# Patient Record
Sex: Female | Born: 1995 | Race: Black or African American | Hispanic: No | Marital: Single | Smoking: Former smoker
Health system: Southern US, Community
[De-identification: ages and names within clinical notes are randomized; demographics above are authoritative.]

## PROBLEM LIST (undated history)

## (undated) ENCOUNTER — Inpatient Hospital Stay (HOSPITAL_COMMUNITY): Payer: Self-pay

## (undated) DIAGNOSIS — B999 Unspecified infectious disease: Secondary | ICD-10-CM

## (undated) DIAGNOSIS — F419 Anxiety disorder, unspecified: Secondary | ICD-10-CM

## (undated) DIAGNOSIS — D649 Anemia, unspecified: Secondary | ICD-10-CM

## (undated) DIAGNOSIS — A549 Gonococcal infection, unspecified: Secondary | ICD-10-CM

## (undated) DIAGNOSIS — A749 Chlamydial infection, unspecified: Secondary | ICD-10-CM

## (undated) DIAGNOSIS — B009 Herpesviral infection, unspecified: Secondary | ICD-10-CM

## (undated) HISTORY — DX: Anxiety disorder, unspecified: F41.9

## (undated) HISTORY — PX: NO PAST SURGERIES: SHX2092

## (undated) HISTORY — DX: Gonococcal infection, unspecified: A54.9

## (undated) HISTORY — DX: Anemia, unspecified: D64.9

## (undated) HISTORY — DX: Herpesviral infection, unspecified: B00.9

## (undated) HISTORY — DX: Chlamydial infection, unspecified: A74.9

---
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
## 2014-03-18 ENCOUNTER — Ambulatory Visit (INDEPENDENT_AMBULATORY_CARE_PROVIDER_SITE_OTHER): Payer: BC Managed Care – PPO | Admitting: Women's Health

## 2014-03-18 ENCOUNTER — Other Ambulatory Visit: Payer: Self-pay | Admitting: Women's Health

## 2014-03-18 ENCOUNTER — Encounter: Payer: Self-pay | Admitting: Women's Health

## 2014-03-18 DIAGNOSIS — N898 Other specified noninflammatory disorders of vagina: Secondary | ICD-10-CM

## 2014-03-18 DIAGNOSIS — R350 Frequency of micturition: Secondary | ICD-10-CM

## 2014-03-18 DIAGNOSIS — B3731 Acute candidiasis of vulva and vagina: Secondary | ICD-10-CM

## 2014-03-18 DIAGNOSIS — N760 Acute vaginitis: Secondary | ICD-10-CM

## 2014-03-18 DIAGNOSIS — B9689 Other specified bacterial agents as the cause of diseases classified elsewhere: Secondary | ICD-10-CM

## 2014-03-18 DIAGNOSIS — Z3049 Encounter for surveillance of other contraceptives: Secondary | ICD-10-CM

## 2014-03-18 DIAGNOSIS — N9489 Other specified conditions associated with female genital organs and menstrual cycle: Secondary | ICD-10-CM

## 2014-03-18 DIAGNOSIS — B373 Candidiasis of vulva and vagina: Secondary | ICD-10-CM

## 2014-03-18 DIAGNOSIS — A499 Bacterial infection, unspecified: Secondary | ICD-10-CM

## 2014-03-18 LAB — URINALYSIS W MICROSCOPIC + REFLEX CULTURE
Bilirubin Urine: NEGATIVE
CASTS: NONE SEEN
CRYSTALS: NONE SEEN
Glucose, UA: NEGATIVE mg/dL
Ketones, ur: NEGATIVE mg/dL
Nitrite: NEGATIVE
Protein, ur: NEGATIVE mg/dL
SPECIFIC GRAVITY, URINE: 1.02 (ref 1.005–1.030)
Urobilinogen, UA: 1 mg/dL (ref 0.0–1.0)
pH: 7 (ref 5.0–8.0)

## 2014-03-18 LAB — WET PREP FOR TRICH, YEAST, CLUE: Trich, Wet Prep: NONE SEEN

## 2014-03-18 LAB — PREGNANCY, URINE: PREG TEST UR: NEGATIVE

## 2014-03-18 MED ORDER — FLUCONAZOLE 150 MG PO TABS: 150.0000 mg | ORAL_TABLET | Freq: Once | ORAL | Status: DC

## 2014-03-18 MED ORDER — METRONIDAZOLE 500 MG PO TABS: 500.0000 mg | ORAL_TABLET | Freq: Two times a day (BID) | ORAL | Status: DC

## 2014-03-18 MED ORDER — MEDROXYPROGESTERONE ACETATE 150 MG/ML IM SUSP
150.0000 mg | Freq: Once | INTRAMUSCULAR | Status: AC
  Administered 2014-03-18: 150 mg via INTRAMUSCULAR

## 2014-03-18 NOTE — Patient Instructions (Signed)
Monilial Vaginitis Vaginitis in a soreness, swelling and redness (inflammation) of the vagina and vulva. Monilial vaginitis is not a sexually transmitted infection. CAUSES  Yeast vaginitis is caused by yeast (candida) that is normally found in your vagina. With a yeast infection, the candida has overgrown in number to a point that upsets the chemical balance. SYMPTOMS   White, thick vaginal discharge.  Swelling, itching, redness and irritation of the vagina and possibly the lips of the vagina (vulva).  Burning or painful urination.  Painful intercourse. DIAGNOSIS  Things that may contribute to monilial vaginitis are:  Postmenopausal and virginal states.  Pregnancy.  Infections.  Being tired, sick or stressed, especially if you had monilial vaginitis in the past.  Diabetes. Good control will help lower the chance.  Birth control pills.  Tight fitting garments.  Using bubble bath, feminine sprays, douches or deodorant tampons.  Taking certain medications that kill germs (antibiotics).  Sporadic recurrence can occur if you become ill. TREATMENT  Your caregiver will give you medication.  There are several kinds of anti monilial vaginal creams and suppositories specific for monilial vaginitis. For recurrent yeast infections, use a suppository or cream in the vagina 2 times a week, or as directed.  Anti-monilial or steroid cream for the itching or irritation of the vulva may also be used. Get your caregiver's permission.  Painting the vagina with methylene blue solution may help if the monilial cream does not work.  Eating yogurt may help prevent monilial vaginitis. HOME CARE INSTRUCTIONS   Finish all medication as prescribed.  Do not have sex until treatment is completed or after your caregiver tells you it is okay.  Take warm sitz baths.  Do not douche.  Do not use tampons, especially scented ones.  Wear cotton underwear.  Avoid tight pants and panty  hose.  Tell your sexual partner that you have a yeast infection. They should go to their caregiver if they have symptoms such as mild rash or itching.  Your sexual partner should be treated as well if your infection is difficult to eliminate.  Practice safer sex. Use condoms.  Some vaginal medications cause latex condoms to fail. Vaginal medications that harm condoms are:  Cleocin cream.  Butoconazole (Femstat).  Terconazole (Terazol) vaginal suppository.  Miconazole (Monistat) (may be purchased over the counter). SEEK MEDICAL CARE IF:   You have a temperature by mouth above 102 F (38.9 C).  The infection is getting worse after 2 days of treatment.  The infection is not getting better after 3 days of treatment.  You develop blisters in or around your vagina.  You develop vaginal bleeding, and it is not your menstrual period.  You have pain when you urinate.  You develop intestinal problems.  You have pain with sexual intercourse. Document Released: 04/17/2005 Document Revised: 09/30/2011 Document Reviewed: 12/30/2008 ExitCare Patient Information 2015 [employer]. This information is not intended to replace advice given to you by your health care provider. Make sure you discuss any questions you have with your health care provider. Bacterial Vaginosis Bacterial vaginosis is an infection of the vagina. It happens when too many of certain germs (bacteria) grow in the vagina. HOME CARE  Take your medicine as told by your doctor.  Finish your medicine even if you start to feel better.  Do not have sex until you finish your medicine and are better.  Tell your sex partner that you have an infection. They should see their doctor for treatment.  Practice safe sex.   Use condoms. Have only one sex partner. GET HELP IF:  You are not getting better after 3 days of treatment.  You have more grey fluid (discharge) coming from your vagina than before.  You have more  pain than before.  You have a fever. MAKE SURE YOU:   Understand these instructions.  Will watch your condition.  Will get help right away if you are not doing well or get worse. Document Released: 04/16/2008 Document Revised: 04/28/2013 Document Reviewed: 02/17/2013 ExitCare Patient Information 2015 [employer]. This information is not intended to replace advice given to you by your health care provider. Make sure you discuss any questions you have with your health care provider.  

## 2014-03-18 NOTE — Progress Notes (Addendum)
Patient ID: Veronica Mclean, female   DOB: [birth year], 18 y.o.   MRN: [id number]  Presents with complaint of vaginal bleeding for about one month, 2 weeks late for Depo-Provera. Has not been sexually active for greater than month. Discharge with odor and irritation. Increased urinary frequency with urgency, denies pain with urination. Denies fever. Negative cultures with past partner. Uses condoms consistently.  Exam: Appears well. External genitalia within normal limits, speculum exam moderate amount of a bloody discharge with  odor, wet prep positive for yeast, amines, clues, and TNTC bacteria. Bimanual no CMT or adnexal fullness or tenderness. U PT negative. UA: Trace leukocytes, 7-10 WBCs, many bacteria,many squamous epithelium.  Bacteria vaginosis with yeast Overdue for Depo-Provera  Plan: Depo-Provera 150 IM every 12 weeks, reviewed importance of returning to office every 12 weeks, continue condoms. Flagyl 500 twice daily for 7 days, alcohol precautions reviewed. Diflucan 150 by mouth x1 dose prescription, proper use given and reviewed. Instructed to call or return if no relief of symptoms. Urine culture pending, will await culture results prior to treating.

## 2014-03-18 NOTE — Addendum Note (Signed)
Addended by: Berna Spare A on: 03/18/2014 04:32 PM   Modules accepted: Orders

## 2014-03-19 LAB — URINE CULTURE: Colony Count: 40000

## 2014-06-06 ENCOUNTER — Ambulatory Visit (INDEPENDENT_AMBULATORY_CARE_PROVIDER_SITE_OTHER): Payer: BC Managed Care – PPO | Admitting: *Deleted

## 2014-06-06 DIAGNOSIS — Z3042 Encounter for surveillance of injectable contraceptive: Secondary | ICD-10-CM

## 2014-06-06 MED ORDER — MEDROXYPROGESTERONE ACETATE 150 MG/ML IM SUSP
150.0000 mg | Freq: Once | INTRAMUSCULAR | Status: AC
  Administered 2014-06-06: 150 mg via INTRAMUSCULAR

## 2014-12-23 ENCOUNTER — Ambulatory Visit (INDEPENDENT_AMBULATORY_CARE_PROVIDER_SITE_OTHER): Payer: BLUE CROSS/BLUE SHIELD | Admitting: Women's Health

## 2014-12-23 ENCOUNTER — Encounter: Payer: Self-pay | Admitting: Women's Health

## 2014-12-23 VITALS — BP 110/76 | Ht 67.0 in | Wt 141.4 lb

## 2014-12-23 DIAGNOSIS — Z01419 Encounter for gynecological examination (general) (routine) without abnormal findings: Secondary | ICD-10-CM | POA: Diagnosis not present

## 2014-12-23 DIAGNOSIS — N938 Other specified abnormal uterine and vaginal bleeding: Secondary | ICD-10-CM

## 2014-12-23 DIAGNOSIS — Z30018 Encounter for initial prescription of other contraceptives: Secondary | ICD-10-CM

## 2014-12-23 LAB — COMPREHENSIVE METABOLIC PANEL
ALK PHOS: 49 U/L (ref 39–117)
ALT: 17 U/L (ref 0–35)
AST: 20 U/L (ref 0–37)
Albumin: 4.3 g/dL (ref 3.5–5.2)
BUN: 12 mg/dL (ref 6–23)
CO2: 26 mEq/L (ref 19–32)
Calcium: 9.9 mg/dL (ref 8.4–10.5)
Chloride: 106 mEq/L (ref 96–112)
Creat: 0.78 mg/dL (ref 0.50–1.10)
Glucose, Bld: 83 mg/dL (ref 70–99)
Potassium: 5.1 mEq/L (ref 3.5–5.3)
SODIUM: 141 meq/L (ref 135–145)
Total Bilirubin: 0.9 mg/dL (ref 0.2–1.2)
Total Protein: 7.1 g/dL (ref 6.0–8.3)

## 2014-12-23 LAB — CBC WITH DIFFERENTIAL/PLATELET
BASOS ABS: 0 10*3/uL (ref 0.0–0.1)
Basophils Relative: 0 % (ref 0–1)
Eosinophils Absolute: 0.1 10*3/uL (ref 0.0–0.7)
Eosinophils Relative: 4 % (ref 0–5)
HCT: 40.7 % (ref 36.0–46.0)
Hemoglobin: 13.9 g/dL (ref 12.0–15.0)
LYMPHS PCT: 45 % (ref 12–46)
Lymphs Abs: 1.4 10*3/uL (ref 0.7–4.0)
MCH: 29 pg (ref 26.0–34.0)
MCHC: 34.2 g/dL (ref 30.0–36.0)
MCV: 84.8 fL (ref 78.0–100.0)
MONO ABS: 0.3 10*3/uL (ref 0.1–1.0)
MPV: 10.9 fL (ref 8.6–12.4)
Monocytes Relative: 9 % (ref 3–12)
Neutro Abs: 1.3 10*3/uL — ABNORMAL LOW (ref 1.7–7.7)
Neutrophils Relative %: 42 % — ABNORMAL LOW (ref 43–77)
PLATELETS: 181 10*3/uL (ref 150–400)
RBC: 4.8 MIL/uL (ref 3.87–5.11)
RDW: 12.6 % (ref 11.5–15.5)
WBC: 3 10*3/uL — AB (ref 4.0–10.5)

## 2014-12-23 LAB — TSH: TSH: 1.976 u[IU]/mL (ref 0.350–4.500)

## 2014-12-23 MED ORDER — MEDROXYPROGESTERONE ACETATE 150 MG/ML IM SUSP
INTRAMUSCULAR | Status: DC
Start: 2014-12-23 — End: 2015-03-28

## 2014-12-23 MED ORDER — MEDROXYPROGESTERONE ACETATE 10 MG PO TABS: 10.0000 mg | ORAL_TABLET | Freq: Every day | ORAL | Status: DC

## 2014-12-23 NOTE — Progress Notes (Signed)
Veronica MainlandZynovia J Mclean 07/24/1995 [id number]    History:    Presents for annual exam.  Amenorrheic on Depo-Provera ,did not receive last dose due in February and has had irregular bleeding since. Same partner with negative STD screen/condoms. Completed gardasil.  Past medical history, past surgical history, family history and social history were all reviewed and documented in the EPIC chart. [employer] [facility], pre- nursing. Mother healthy.  ROS:  A ROS was performed and pertinent positives and negatives are included.  Exam:  Filed Vitals:   12/23/14 1219  BP: 110/76    General appearance:  Normal Thyroid:  Symmetrical, normal in size, without palpable masses or nodularity. Respiratory  Auscultation:  Clear without wheezing or rhonchi Cardiovascular  Auscultation:  Regular rate, without rubs, murmurs or gallops  Edema/varicosities:  Not grossly evident Abdominal  Soft,nontender, without masses, guarding or rebound.  Liver/spleen:  No organomegaly noted  Hernia:  None appreciated  Skin  Inspection:  Grossly normal   Breasts: Examined lying and sitting.     Right: Without masses, retractions, discharge or axillary adenopathy.     Left: Without masses, retractions, discharge or axillary adenopathy. Gentitourinary   Inguinal/mons:  Normal without inguinal adenopathy  External genitalia:  Normal  BUS/Urethra/Skene's glands:  Normal  Vagina:  Normal no blood or discharge noted  Cervix:  Normal  Uterus:   normal in size, shape and contour.  Midline and mobile  Adnexa/parametria:     Rt: Without masses or tenderness.   Lt: Without masses or tenderness.  Anus and perineum: Normal    Assessment/Plan:  19 y.o. SBF G0  for annual exam with complaint of irregular bleeding for the past 3 months.  Iregular bleeding after stopping Depo-Provera Contraception management  Plan: Contraception options reviewed, Depo-Provera 150 every 12 weeks, prescription, proper use given and reviewed importance of  calcium rich diet. Return to office for Depo-Provera, condoms until or abstinence.CBC, CMP, TSH, UA, qualitative hCG, GC/chlamydia. E's, exercise, calcium rich diet, MVI daily encouraged. Campus safety reviewed.  Harrington ChallengerYOUNG,NANCY J WHNP, 1:12 PM 12/23/2014

## 2014-12-23 NOTE — Patient Instructions (Signed)

## 2014-12-24 LAB — HCG, SERUM, QUALITATIVE: Preg, Serum: NEGATIVE

## 2014-12-24 LAB — GC/CHLAMYDIA PROBE AMP
CT PROBE, AMP APTIMA: POSITIVE — AB
GC PROBE AMP APTIMA: POSITIVE — AB

## 2014-12-26 ENCOUNTER — Encounter: Payer: Self-pay | Admitting: Gynecology

## 2014-12-27 ENCOUNTER — Other Ambulatory Visit: Payer: Self-pay | Admitting: Women's Health

## 2014-12-27 MED ORDER — AZITHROMYCIN 500 MG PO TABS: 1000.0000 mg | ORAL_TABLET | Freq: Once | ORAL | Status: DC

## 2014-12-27 MED ORDER — CEFIXIME 400 MG PO CAPS: 400.0000 mg | ORAL_CAPSULE | Freq: Once | ORAL | Status: DC

## 2015-01-18 ENCOUNTER — Ambulatory Visit: Payer: BLUE CROSS/BLUE SHIELD | Admitting: Women's Health

## 2015-03-28 ENCOUNTER — Ambulatory Visit (INDEPENDENT_AMBULATORY_CARE_PROVIDER_SITE_OTHER): Payer: BLUE CROSS/BLUE SHIELD | Admitting: Adult Health

## 2015-03-28 ENCOUNTER — Encounter: Payer: Self-pay | Admitting: Adult Health

## 2015-03-28 VITALS — BP 102/70 | HR 89 | Temp 98.4°F | Ht 67.0 in | Wt 136.7 lb

## 2015-03-28 DIAGNOSIS — Z716 Tobacco abuse counseling: Secondary | ICD-10-CM

## 2015-03-28 DIAGNOSIS — Z7189 Other specified counseling: Secondary | ICD-10-CM | POA: Diagnosis not present

## 2015-03-28 DIAGNOSIS — Z7689 Persons encountering health services in other specified circumstances: Secondary | ICD-10-CM

## 2015-03-28 NOTE — Patient Instructions (Addendum)
It was great meeting you today!  You are due for a physical in June 2017, so please let me know if you need anything.    Health Maintenance Adopting a healthy lifestyle and getting preventive care can go a long way to promote health and wellness. Talk with your health care provider about what schedule of regular examinations is right for you. This is a good chance for you to check in with your provider about disease prevention and staying healthy. In between checkups, there are plenty of things you can do on your own. Experts have done a lot of research about which lifestyle changes and preventive measures are most likely to keep you healthy. Ask your health care provider for more information. WEIGHT AND DIET  Eat a healthy diet  Be sure to include plenty of vegetables, fruits, low-fat dairy products, and lean protein.  Do not eat a lot of foods high in solid fats, added sugars, or salt.  Get regular exercise. This is one of the most important things you can do for your health.  Most adults should exercise for at least 150 minutes each week. The exercise should increase your heart rate and make you sweat (moderate-intensity exercise).  Most adults should also do strengthening exercises at least twice a week. This is in addition to the moderate-intensity exercise.  Maintain a healthy weight  Body mass index (BMI) is a measurement that can be used to identify possible weight problems. It estimates body fat based on height and weight. Your health care provider can help determine your BMI and help you achieve or maintain a healthy weight.  For females 27 years of age and older:   A BMI below 18.5 is considered underweight.  A BMI of 18.5 to 24.9 is normal.  A BMI of 25 to 29.9 is considered overweight.  A BMI of 30 and above is considered obese.  Watch levels of cholesterol and blood lipids  You should start having your blood tested for lipids and cholesterol at 20 years of age,  then have this test every 5 years.  You may need to have your cholesterol levels checked more often if:  Your lipid or cholesterol levels are high.  You are older than 20 years of age.  You are at high risk for heart disease.  CANCER SCREENING   Lung Cancer  Lung cancer screening is recommended for adults 55-59 years old who are at high risk for lung cancer because of a history of smoking.  A yearly low-dose CT scan of the lungs is recommended for people who:  Currently smoke.  Have quit within the past 15 years.  Have at least a 30-pack-year history of smoking. A pack year is smoking an average of one pack of cigarettes a day for 1 year.  Yearly screening should continue until it has been 15 years since you quit.  Yearly screening should stop if you develop a health problem that would prevent you from having lung cancer treatment.  Breast Cancer  Practice breast self-awareness. This means understanding how your breasts normally appear and feel.  It also means doing regular breast self-exams. Let your health care provider know about any changes, no matter how small.  If you are in your 20s or 30s, you should have a clinical breast exam (CBE) by a health care provider every 1-3 years as part of a regular health exam.  If you are 23 or older, have a CBE every year. Also consider having a  breast X-ray (mammogram) every year.  If you have a family history of breast cancer, talk to your health care provider about genetic screening.  If you are at high risk for breast cancer, talk to your health care provider about having an MRI and a mammogram every year.  Breast cancer gene (BRCA) assessment is recommended for women who have family members with BRCA-related cancers. BRCA-related cancers include:  Breast.  Ovarian.  Tubal.  Peritoneal cancers.  Results of the assessment will determine the need for genetic counseling and BRCA1 and BRCA2 testing. Cervical  Cancer Routine pelvic examinations to screen for cervical cancer are no longer recommended for nonpregnant women who are considered low risk for cancer of the pelvic organs (ovaries, uterus, and vagina) and who do not have symptoms. A pelvic examination may be necessary if you have symptoms including those associated with pelvic infections. Ask your health care provider if a screening pelvic exam is right for you.   The Pap test is the screening test for cervical cancer for women who are considered at risk.  If you had a hysterectomy for a problem that was not cancer or a condition that could lead to cancer, then you no longer need Pap tests.  If you are older than 65 years, and you have had normal Pap tests for the past 10 years, you no longer need to have Pap tests.  If you have had past treatment for cervical cancer or a condition that could lead to cancer, you need Pap tests and screening for cancer for at least 20 years after your treatment.  If you no longer get a Pap test, assess your risk factors if they change (such as having a new sexual partner). This can affect whether you should start being screened again.  Some women have medical problems that increase their chance of getting cervical cancer. If this is the case for you, your health care provider may recommend more frequent screening and Pap tests.  The human papillomavirus (HPV) test is another test that may be used for cervical cancer screening. The HPV test looks for the virus that can cause cell changes in the cervix. The cells collected during the Pap test can be tested for HPV.  The HPV test can be used to screen women 58 years of age and older. Getting tested for HPV can extend the interval between normal Pap tests from three to five years.  An HPV test also should be used to screen women of any age who have unclear Pap test results.  After 20 years of age, women should have HPV testing as often as Pap tests.  Colorectal  Cancer  This type of cancer can be detected and often prevented.  Routine colorectal cancer screening usually begins at 20 years of age and continues through 20 years of age.  Your health care provider may recommend screening at an earlier age if you have risk factors for colon cancer.  Your health care provider may also recommend using home test kits to check for hidden blood in the stool.  A small camera at the end of a tube can be used to examine your colon directly (sigmoidoscopy or colonoscopy). This is done to check for the earliest forms of colorectal cancer.  Routine screening usually begins at age 71.  Direct examination of the colon should be repeated every 5-10 years through 20 years of age. However, you may need to be screened more often if early forms of precancerous polyps or  small growths are found. Skin Cancer  Check your skin from head to toe regularly.  Tell your health care provider about any new moles or changes in moles, especially if there is a change in a mole's shape or color.  Also tell your health care provider if you have a mole that is larger than the size of a pencil eraser.  Always use sunscreen. Apply sunscreen liberally and repeatedly throughout the day.  Protect yourself by wearing long sleeves, pants, a wide-brimmed hat, and sunglasses whenever you are outside. HEART DISEASE, DIABETES, AND HIGH BLOOD PRESSURE   Have your blood pressure checked at least every 1-2 years. High blood pressure causes heart disease and increases the risk of stroke.  If you are between 81 years and 50 years old, ask your health care provider if you should take aspirin to prevent strokes.  Have regular diabetes screenings. This involves taking a blood sample to check your fasting blood sugar level.  If you are at a normal weight and have a low risk for diabetes, have this test once every three years after 20 years of age.  If you are overweight and have a high risk for  diabetes, consider being tested at a younger age or more often. PREVENTING INFECTION  Hepatitis B  If you have a higher risk for hepatitis B, you should be screened for this virus. You are considered at high risk for hepatitis B if:  You were born in a country where hepatitis B is common. Ask your health care provider which countries are considered high risk.  Your parents were born in a high-risk country, and you have not been immunized against hepatitis B (hepatitis B vaccine).  You have HIV or AIDS.  You use needles to inject street drugs.  You live with someone who has hepatitis B.  You have had sex with someone who has hepatitis B.  You get hemodialysis treatment.  You take certain medicines for conditions, including cancer, organ transplantation, and autoimmune conditions. Hepatitis C  Blood testing is recommended for:  Everyone born from [birth year] through 1966.  Anyone with known risk factors for hepatitis C. Sexually transmitted infections (STIs)  You should be screened for sexually transmitted infections (STIs) including gonorrhea and chlamydia if:  You are sexually active and are younger than 20 years of age.  You are older than 20 years of age and your health care provider tells you that you are at risk for this type of infection.  Your sexual activity has changed since you were last screened and you are at an increased risk for chlamydia or gonorrhea. Ask your health care provider if you are at risk.  If you do not have HIV, but are at risk, it may be recommended that you take a prescription medicine daily to prevent HIV infection. This is called pre-exposure prophylaxis (PrEP). You are considered at risk if:  You are sexually active and do not regularly use condoms or know the HIV status of your partner(s).  You take drugs by injection.  You are sexually active with a partner who has HIV. Talk with your health care provider about whether you are at high risk of  being infected with HIV. If you choose to begin PrEP, you should first be tested for HIV. You should then be tested every 3 months for as long as you are taking PrEP.  PREGNANCY   If you are premenopausal and you may become pregnant, ask your health care provider about preconception  counseling.  If you may become pregnant, take 400 to 800 micrograms (mcg) of folic acid every day.  If you want to prevent pregnancy, talk to your health care provider about birth control (contraception). OSTEOPOROSIS AND MENOPAUSE   Osteoporosis is a disease in which the bones lose minerals and strength with aging. This can result in serious bone fractures. Your risk for osteoporosis can be identified using a bone density scan.  If you are 43 years of age or older, or if you are at risk for osteoporosis and fractures, ask your health care provider if you should be screened.  Ask your health care provider whether you should take a calcium or vitamin D supplement to lower your risk for osteoporosis.  Menopause may have certain physical symptoms and risks.  Hormone replacement therapy may reduce some of these symptoms and risks. Talk to your health care provider about whether hormone replacement therapy is right for you.  HOME CARE INSTRUCTIONS   Schedule regular health, dental, and eye exams.  Stay current with your immunizations.   Do not use any tobacco products including cigarettes, chewing tobacco, or electronic cigarettes.  If you are pregnant, do not drink alcohol.  If you are breastfeeding, limit how much and how often you drink alcohol.  Limit alcohol intake to no more than 1 drink per day for nonpregnant women. One drink equals 12 ounces of beer, 5 ounces of wine, or 1 ounces of hard liquor.  Do not use street drugs.  Do not share needles.  Ask your health care provider for help if you need support or information about quitting drugs.  Tell your health care provider if you often feel  depressed.  Tell your health care provider if you have ever been abused or do not feel safe at home. Document Released: 01/21/2011 Document Revised: 11/22/2013 Document Reviewed: 06/09/2013 [facility] Patient Information 2015 [place], [place]. This information is not intended to replace advice given to you by your health care provider. Make sure you discuss any questions you have with your health care provider.

## 2015-03-28 NOTE — Progress Notes (Signed)
HPI:  Veronica Mclean is here to establish care.  Last PCP and physical: She was seeing her GYN for complete physicals. Last CPE was in June.   Has the following chronic problems that require follow up and concerns today:  Smoking Cessation She has been smoking cigars for three years. Smokes 3 cigars a day.She is working on quitting, does not want any help right now.   ROS negative for unless reported above: fevers, chills,feeling poorly, unintentional weight loss, hearing or vision loss, chest pain, palpitations, leg claudication, struggling to breath,Not feeling congested in the chest, no orthopenia, no cough,no wheezing, normal appetite, no soft tissue swelling, no hemoptysis, melena, hematochezia, hematuria, falls, loc, si, or thoughts of self harm.  Immunizations:UTD, completed Gardasil Diet:Does not follow up a diet.  Exercise: Three times a week, goes to the gym or runs.  Pap Smear: June 2016:  Past Medical History  Diagnosis Date  . Chlamydia 09/2010, 01/2011, 12/2014  . Gonorrhea 09/2010, 12/2014  . HSV infection     No past surgical history on file.  No family history on file.  Social History   Social History  . Marital Status: Single    Spouse Name: N/A  . Number of Children: N/A  . Years of Education: N/A   Social History Main Topics  . Smoking status: Current Every Day Smoker    Types: Cigars  . Smokeless tobacco: Never Used  . Alcohol Use: 0.0 oz/week    0 Standard drinks or equivalent per week     Comment: occ  . Drug Use: No  . Sexual Activity: Yes    Birth Control/ Protection: Condom   Other Topics Concern  . None   Social History Narrative     Current outpatient prescriptions:  .  ibuprofen (ADVIL,MOTRIN) 200 MG tablet, Take 200 mg by mouth every 6 (six) hours as needed for fever., Disp: , Rfl:  .  Cefixime (SUPRAX) 400 MG CAPS capsule, Take 1 capsule (400 mg total) by mouth once. (Patient not taking: Reported on 03/28/2015), Disp: 1 capsule,  Rfl: 0  EXAM:  Filed Vitals:   03/28/15 1306  BP: 102/70  Pulse: 89  Temp: 98.4 F (36.9 C)    Body mass index is 21.41 kg/(m^2).  GENERAL: vitals reviewed and listed above, alert, oriented, appears well hydrated and in no acute distress  HEENT: atraumatic, conjunttiva clear, no obvious abnormalities on inspection of external nose and ears. TM's visualized, no cerumen impaction.   NECK: Neck is soft and supple without masses, no adenopathy or thyromegaly, trachea midline, no JVD. Normal range of motion.   LUNGS: clear to auscultation bilaterally, no wheezes, rales or rhonchi, good air movement  CV: Regular rate and rhythm, normal S1/S2, no audible murmurs, gallops, or rubs. No carotid bruit and no peripheral edema.   MS: moves all extremities without noticeable abnormality. No edema noted  Abd: soft/nontender/nondistended/normal bowel sounds   Skin: warm and dry, no rash   Extremities: No clubbing, cyanosis, or edema. Capillary refill is WNL. Pulses intact bilaterally in upper and lower extremities.   Neuro: CN II-XII intact, sensation and reflexes normal throughout, 5/5 muscle strength in bilateral upper and lower extremities. Normal finger to nose. Normal rapid alternating movements. Normal romberg. No pronator drift.   PSYCH: pleasant and cooperative, no obvious depression or anxiety  ASSESSMENT AND PLAN: 1. Encounter to establish care - Follow up in June 2017 for CPE - Follow up if needed  2. Smoking Cessation - Spent  approx. 3 minutes reviewing options - She would like to taper herself off cigars    -We reviewed the PMH, PSH, FH, SH, Meds and Allergies. -We provided refills for any medications we will prescribe as needed. -We addressed current concerns per orders and patient instructions. -We have asked for records for pertinent exams, studies, vaccines and notes from previous providers. -We have advised patient to follow up per instructions  below.   -Patient advised to return or notify a provider immediately if symptoms worsen or persist or new concerns arise.  There are no Patient Instructions on file for this visit.   Shirline Frees, AGNP

## 2015-05-10 ENCOUNTER — Other Ambulatory Visit: Payer: Self-pay | Admitting: Women's Health

## 2015-05-10 ENCOUNTER — Telehealth: Payer: Self-pay | Admitting: *Deleted

## 2015-05-10 NOTE — Telephone Encounter (Signed)
Pt was diagnosed with GC/chlamydia on 12/23/14 pt said she didn't have the money at the time and never picked up Rx. Would now like Rx re-sent to pharmacy, states she has not been sexual active since receiving positive results. Rx was for Zithromax 1 gm and suprax 400mg . Please advise

## 2015-05-10 NOTE — Telephone Encounter (Deleted)
Tried to call patient to inform her but voice mail box is not set up.

## 2015-05-10 NOTE — Telephone Encounter (Signed)
Okay to refill medication, if medication is not picked up after prescribed in a timely manner is it not allow it to be given to patient?  office visit to check test of cure in 3 weeks, continue to abstain.

## 2015-05-10 NOTE — Telephone Encounter (Addendum)
I spoke with the pharmacy and the patient never picked up the Rx's from 12/27/14.  I asked him to go ahead and re-fill them as the Rx's are still good at the pharmacy. I will re-order them in our Epic System again today as phone in. Patient informed and I also advised her to make TOC appt one month after finishing medication to be sure infections are cleared. She said she would call back to schedule.

## 2015-11-20 ENCOUNTER — Ambulatory Visit (INDEPENDENT_AMBULATORY_CARE_PROVIDER_SITE_OTHER): Payer: BLUE CROSS/BLUE SHIELD | Admitting: Family Medicine

## 2015-11-20 ENCOUNTER — Encounter: Payer: Self-pay | Admitting: Family Medicine

## 2015-11-20 VITALS — BP 105/63 | HR 91 | Temp 99.0°F | Ht 67.0 in | Wt 138.0 lb

## 2015-11-20 DIAGNOSIS — J020 Streptococcal pharyngitis: Secondary | ICD-10-CM

## 2015-11-20 LAB — POCT RAPID STREP A (OFFICE): Rapid Strep A Screen: NEGATIVE

## 2015-11-20 MED ORDER — CEPHALEXIN 500 MG PO CAPS: 500.0000 mg | ORAL_CAPSULE | Freq: Three times a day (TID) | ORAL | Status: AC

## 2015-11-20 NOTE — Progress Notes (Signed)
Pre visit review using our clinic review tool, if applicable. No additional management support is needed unless otherwise documented below in the visit note. 

## 2015-11-20 NOTE — Progress Notes (Signed)
   Subjective:    Patient ID: Veronica Mclean, female    DOB: 10/17/1995, 20 y.o.   MRN: [id number]  HPI Here for 2 days of fever to 100.4 degrees and a bed ST. Her lymph nodes are swollen. Speaking and swallowing are difficult. No cough or NVD. Drinking fluids and taking Aleve.    Review of Systems  Constitutional: Positive for fever and chills. Negative for diaphoresis.  HENT: Positive for sore throat and trouble swallowing. Negative for congestion, ear pain, postnasal drip and sinus pressure.   Eyes: Negative.   Respiratory: Negative.        Objective:   Physical Exam  Constitutional: She appears well-developed and well-nourished.  HENT:  Right Ear: External ear normal.  Left Ear: External ear normal.  Nose: Nose normal.  Posterior OP is red without exudate   Eyes: Conjunctivae are normal.  Neck: No thyromegaly present.  Tender swollen AC nodes   Pulmonary/Chest: Effort normal and breath sounds normal.          Assessment & Plan:  Pharyngitis, likely due to strep. Treat with Keflex. Written out of work 11-19-15 until 11-22-15.  Nelwyn SalisburyFRY,STEPHEN A, MD

## 2015-12-21 ENCOUNTER — Other Ambulatory Visit: Payer: BLUE CROSS/BLUE SHIELD

## 2015-12-26 ENCOUNTER — Encounter: Payer: BLUE CROSS/BLUE SHIELD | Admitting: Adult Health

## 2015-12-26 ENCOUNTER — Telehealth: Payer: Self-pay

## 2015-12-26 DIAGNOSIS — Z0289 Encounter for other administrative examinations: Secondary | ICD-10-CM

## 2015-12-26 NOTE — Telephone Encounter (Signed)
I contacted patient. She states that overslept and missed appt. Patient rescheduled. Charge patient.

## 2015-12-28 ENCOUNTER — Encounter: Payer: Self-pay | Admitting: Adult Health

## 2015-12-28 ENCOUNTER — Ambulatory Visit (INDEPENDENT_AMBULATORY_CARE_PROVIDER_SITE_OTHER): Payer: BLUE CROSS/BLUE SHIELD | Admitting: Adult Health

## 2015-12-28 VITALS — BP 122/80 | Temp 98.2°F | Ht 67.0 in | Wt 138.7 lb

## 2015-12-28 DIAGNOSIS — Z0000 Encounter for general adult medical examination without abnormal findings: Secondary | ICD-10-CM

## 2015-12-28 DIAGNOSIS — F17200 Nicotine dependence, unspecified, uncomplicated: Secondary | ICD-10-CM | POA: Diagnosis not present

## 2015-12-28 NOTE — Patient Instructions (Addendum)
It was great seeing you again today!  Please quit smoking.   Follow up with me in 1-2 years for your next physical  Health Maintenance, Female Adopting a healthy lifestyle and getting preventive care can go a long way to promote health and wellness. Talk with your health care provider about what schedule of regular examinations is right for you. This is a good chance for you to check in with your provider about disease prevention and staying healthy. In between checkups, there are plenty of things you can do on your own. Experts have done a lot of research about which lifestyle changes and preventive measures are most likely to keep you healthy. Ask your health care provider for more information. WEIGHT AND DIET  Eat a healthy diet  Be sure to include plenty of vegetables, fruits, low-fat dairy products, and lean protein.  Do not eat a lot of foods high in solid fats, added sugars, or salt.  Get regular exercise. This is one of the most important things you can do for your health.  Most adults should exercise for at least 150 minutes each week. The exercise should increase your heart rate and make you sweat (moderate-intensity exercise).  Most adults should also do strengthening exercises at least twice a week. This is in addition to the moderate-intensity exercise.  Maintain a healthy weight  Body mass index (BMI) is a measurement that can be used to identify possible weight problems. It estimates body fat based on height and weight. Your health care provider can help determine your BMI and help you achieve or maintain a healthy weight.  For females 13 years of age and older:   A BMI below 18.5 is considered underweight.  A BMI of 18.5 to 24.9 is normal.  A BMI of 25 to 29.9 is considered overweight.  A BMI of 30 and above is considered obese.  Watch levels of cholesterol and blood lipids  You should start having your blood tested for lipids and cholesterol at 21 years of age,  then have this test every 5 years.  You may need to have your cholesterol levels checked more often if:  Your lipid or cholesterol levels are high.  You are older than 21 years of age.  You are at high risk for heart disease.  CANCER SCREENING   Lung Cancer  Lung cancer screening is recommended for adults 29-30 years old who are at high risk for lung cancer because of a history of smoking.  A yearly low-dose CT scan of the lungs is recommended for people who:  Currently smoke.  Have quit within the past 15 years.  Have at least a 30-pack-year history of smoking. A pack year is smoking an average of one pack of cigarettes a day for 1 year.  Yearly screening should continue until it has been 15 years since you quit.  Yearly screening should stop if you develop a health problem that would prevent you from having lung cancer treatment.  Breast Cancer  Practice breast self-awareness. This means understanding how your breasts normally appear and feel.  It also means doing regular breast self-exams. Let your health care provider know about any changes, no matter how small.  If you are in your 20s or 30s, you should have a clinical breast exam (CBE) by a health care provider every 1-3 years as part of a regular health exam.  If you are 65 or older, have a CBE every year. Also consider having a breast X-ray (  mammogram) every year.  If you have a family history of breast cancer, talk to your health care provider about genetic screening.  If you are at high risk for breast cancer, talk to your health care provider about having an MRI and a mammogram every year.  Breast cancer gene (BRCA) assessment is recommended for women who have family members with BRCA-related cancers. BRCA-related cancers include:  Breast.  Ovarian.  Tubal.  Peritoneal cancers.  Results of the assessment will determine the need for genetic counseling and BRCA1 and BRCA2 testing. Cervical Cancer Your  health care provider may recommend that you be screened regularly for cancer of the pelvic organs (ovaries, uterus, and vagina). This screening involves a pelvic examination, including checking for microscopic changes to the surface of your cervix (Pap test). You may be encouraged to have this screening done every 3 years, beginning at age 34.  For women ages 68-65, health care providers may recommend pelvic exams and Pap testing every 3 years, or they may recommend the Pap and pelvic exam, combined with testing for human papilloma virus (HPV), every 5 years. Some types of HPV increase your risk of cervical cancer. Testing for HPV may also be done on women of any age with unclear Pap test results.  Other health care providers may not recommend any screening for nonpregnant women who are considered low risk for pelvic cancer and who do not have symptoms. Ask your health care provider if a screening pelvic exam is right for you.  If you have had past treatment for cervical cancer or a condition that could lead to cancer, you need Pap tests and screening for cancer for at least 20 years after your treatment. If Pap tests have been discontinued, your risk factors (such as having a new sexual partner) need to be reassessed to determine if screening should resume. Some women have medical problems that increase the chance of getting cervical cancer. In these cases, your health care provider may recommend more frequent screening and Pap tests. Colorectal Cancer  This type of cancer can be detected and often prevented.  Routine colorectal cancer screening usually begins at 21 years of age and continues through 21 years of age.  Your health care provider may recommend screening at an earlier age if you have risk factors for colon cancer.  Your health care provider may also recommend using home test kits to check for hidden blood in the stool.  A small camera at the end of a tube can be used to examine your  colon directly (sigmoidoscopy or colonoscopy). This is done to check for the earliest forms of colorectal cancer.  Routine screening usually begins at age 18.  Direct examination of the colon should be repeated every 5-10 years through 21 years of age. However, you may need to be screened more often if early forms of precancerous polyps or small growths are found. Skin Cancer  Check your skin from head to toe regularly.  Tell your health care provider about any new moles or changes in moles, especially if there is a change in a mole's shape or color.  Also tell your health care provider if you have a mole that is larger than the size of a pencil eraser.  Always use sunscreen. Apply sunscreen liberally and repeatedly throughout the day.  Protect yourself by wearing long sleeves, pants, a wide-brimmed hat, and sunglasses whenever you are outside. HEART DISEASE, DIABETES, AND HIGH BLOOD PRESSURE   High blood pressure causes heart disease  and increases the risk of stroke. High blood pressure is more likely to develop in:  People who have blood pressure in the high end of the normal range (130-139/85-89 mm Hg).  People who are overweight or obese.  People who are African American.  If you are 54-49 years of age, have your blood pressure checked every 3-5 years. If you are 51 years of age or older, have your blood pressure checked every year. You should have your blood pressure measured twice--once when you are at a hospital or clinic, and once when you are not at a hospital or clinic. Record the average of the two measurements. To check your blood pressure when you are not at a hospital or clinic, you can use:  An automated blood pressure machine at a pharmacy.  A home blood pressure monitor.  If you are between 71 years and 12 years old, ask your health care provider if you should take aspirin to prevent strokes.  Have regular diabetes screenings. This involves taking a blood sample to  check your fasting blood sugar level.  If you are at a normal weight and have a low risk for diabetes, have this test once every three years after 21 years of age.  If you are overweight and have a high risk for diabetes, consider being tested at a younger age or more often. PREVENTING INFECTION  Hepatitis B  If you have a higher risk for hepatitis B, you should be screened for this virus. You are considered at high risk for hepatitis B if:  You were born in a country where hepatitis B is common. Ask your health care provider which countries are considered high risk.  Your parents were born in a high-risk country, and you have not been immunized against hepatitis B (hepatitis B vaccine).  You have HIV or AIDS.  You use needles to inject street drugs.  You live with someone who has hepatitis B.  You have had sex with someone who has hepatitis B.  You get hemodialysis treatment.  You take certain medicines for conditions, including cancer, organ transplantation, and autoimmune conditions. Hepatitis C  Blood testing is recommended for:  Everyone born from [birth year] through 1966.  Anyone with known risk factors for hepatitis C. Sexually transmitted infections (STIs)  You should be screened for sexually transmitted infections (STIs) including gonorrhea and chlamydia if:  You are sexually active and are younger than 21 years of age.  You are older than 21 years of age and your health care provider tells you that you are at risk for this type of infection.  Your sexual activity has changed since you were last screened and you are at an increased risk for chlamydia or gonorrhea. Ask your health care provider if you are at risk.  If you do not have HIV, but are at risk, it may be recommended that you take a prescription medicine daily to prevent HIV infection. This is called pre-exposure prophylaxis (PrEP). You are considered at risk if:  You are sexually active and do not regularly use  condoms or know the HIV status of your partner(s).  You take drugs by injection.  You are sexually active with a partner who has HIV. Talk with your health care provider about whether you are at high risk of being infected with HIV. If you choose to begin PrEP, you should first be tested for HIV. You should then be tested every 3 months for as long as you are taking PrEP.  PREGNANCY   If you are premenopausal and you may become pregnant, ask your health care provider about preconception counseling.  If you may become pregnant, take 400 to 800 micrograms (mcg) of folic acid every day.  If you want to prevent pregnancy, talk to your health care provider about birth control (contraception). OSTEOPOROSIS AND MENOPAUSE   Osteoporosis is a disease in which the bones lose minerals and strength with aging. This can result in serious bone fractures. Your risk for osteoporosis can be identified using a bone density scan.  If you are 3 years of age or older, or if you are at risk for osteoporosis and fractures, ask your health care provider if you should be screened.  Ask your health care provider whether you should take a calcium or vitamin D supplement to lower your risk for osteoporosis.  Menopause may have certain physical symptoms and risks.  Hormone replacement therapy may reduce some of these symptoms and risks. Talk to your health care provider about whether hormone replacement therapy is right for you.  HOME CARE INSTRUCTIONS   Schedule regular health, dental, and eye exams.  Stay current with your immunizations.   Do not use any tobacco products including cigarettes, chewing tobacco, or electronic cigarettes.  If you are pregnant, do not drink alcohol.  If you are breastfeeding, limit how much and how often you drink alcohol.  Limit alcohol intake to no more than 1 drink per day for nonpregnant women. One drink equals 12 ounces of beer, 5 ounces of wine, or 1 ounces of hard  liquor.  Do not use street drugs.  Do not share needles.  Ask your health care provider for help if you need support or information about quitting drugs.  Tell your health care provider if you often feel depressed.  Tell your health care provider if you have ever been abused or do not feel safe at home.   This information is not intended to replace advice given to you by your health care provider. Make sure you discuss any questions you have with your health care provider.   Document Released: 01/21/2011 Document Revised: 07/29/2014 Document Reviewed: 06/09/2013 Elsevier Interactive Patient Education [employer].

## 2015-12-28 NOTE — Progress Notes (Signed)
Subjective:    Patient ID: Veronica Mclean, female    DOB: [birth year], 20 y.o.   MRN: [id number]  HPI  Patient presents for yearly preventative medicine examination. She is a pleasant 20 year old female patient who  has a past medical history of Chlamydia (09/2010, 01/2011, 12/2014); Gonorrhea (09/2010, 12/2014); and HSV infection.   All immunizations and health maintenance protocols were reviewed with the patient and needed orders were placed.  Medication reconciliation,  past medical history, social history, problem list and allergies were reviewed in detail with the patient  Goals were established with regard to weight loss, exercise, and  diet in compliance with medications  Her last physical was in June 2016   She continues to exercise and eat healthy   She continues to smoke 3 cigars daily. She has tried to quit in the past but it did not work. She does not want to quit at this time.      Review of Systems  Constitutional: Negative.   HENT: Negative.   Eyes: Negative.   Respiratory: Negative.   Cardiovascular: Negative.   Gastrointestinal: Negative.   Endocrine: Negative.   Genitourinary: Negative.   Musculoskeletal: Negative.   Skin: Negative.   Allergic/Immunologic: Negative.   Neurological: Negative.   Hematological: Negative.   Psychiatric/Behavioral: Negative.   All other systems reviewed and are negative.  Past Medical History  Diagnosis Date  . Chlamydia 09/2010, 01/2011, 12/2014  . Gonorrhea 09/2010, 12/2014  . HSV infection     Social History   Social History  . Marital Status: Single    Spouse Name: N/A  . Number of Children: N/A  . Years of Education: N/A   Occupational History  . Not on file.   Social History Main Topics  . Smoking status: Current Every Day Smoker    Types: Cigars  . Smokeless tobacco: Never Used  . Alcohol Use: 0.0 oz/week    0 Standard drinks or equivalent per week     Comment: occ  . Drug Use: No  . Sexual  Activity: Yes    Birth Control/ Protection: Condom   Other Topics Concern  . Not on file   Social History Narrative   [employer]   Works at [employer].          No past surgical history on file.  No family history on file.  No Known Allergies  Current Outpatient Prescriptions on File Prior to Visit  Medication Sig Dispense Refill  . ibuprofen (ADVIL,MOTRIN) 200 MG tablet Take 200 mg by mouth every 6 (six) hours as needed for fever.     No current facility-administered medications on file prior to visit.    BP 122/80 mmHg  Temp(Src) 98.2 F (36.8 C) (Oral)  Ht  (1.702 m)  Wt 138 lb 11.2 oz (62.914 kg)  BMI 21.72 kg/m2       Objective:   Physical Exam  Constitutional: She is oriented to person, place, and time. She appears well-developed and well-nourished. No distress.  HENT:  Head: Normocephalic and atraumatic.  Right Ear: External ear normal.  Left Ear: External ear normal.  Nose: Nose normal.  Mouth/Throat: Oropharynx is clear and moist. No oropharyngeal exudate.  Eyes: Conjunctivae are normal. Pupils are equal, round, and reactive to light. Right eye exhibits no discharge. Left eye exhibits no discharge. No scleral icterus.  Neck: Normal range of motion. Neck supple. No JVD present. No tracheal  deviation present. No thyromegaly present.  Cardiovascular: Normal rate, regular rhythm, normal heart sounds and intact distal pulses.  Exam reveals no gallop and no friction rub.   No murmur heard. Pulmonary/Chest: Effort normal and breath sounds normal. No stridor. No respiratory distress. She has no wheezes. She has no rales. She exhibits no tenderness.  Abdominal: Soft. Bowel sounds are normal. She exhibits no distension and no mass. There is no tenderness. There is no rebound and no guarding.  Musculoskeletal: Normal range of motion. She exhibits no edema or tenderness.  Lymphadenopathy:    She has no cervical adenopathy.    Neurological: She is alert and oriented to person, place, and time. She has normal reflexes. She displays normal reflexes. No cranial nerve deficit. She exhibits normal muscle tone. Coordination normal.  Skin: Skin is warm and dry. No rash noted. She is not diaphoretic. No erythema. No pallor.  Psychiatric: She has a normal mood and affect. Her behavior is normal. Judgment and thought content normal.  Nursing note and vitals reviewed.     Assessment & Plan:  1. Routine general medical examination at a health care facility - She is seeing her GYN this month and will have her labs and pelvic done at that time. - Continue to eat healthy and exercise - Follow up in 1-2 years for next physical.   2. Tobacco use disorder - Educated on the importance of quitting smoking. She has an increased chance of mouth cancer when smoking cigars.  - She will inform me when she is ready to quit and she would like help  Shirline Freesory Bill Yohn, NP

## 2016-01-25 ENCOUNTER — Ambulatory Visit (INDEPENDENT_AMBULATORY_CARE_PROVIDER_SITE_OTHER): Payer: BLUE CROSS/BLUE SHIELD | Admitting: Women's Health

## 2016-01-25 ENCOUNTER — Encounter: Payer: Self-pay | Admitting: Women's Health

## 2016-01-25 VITALS — BP 112/72 | Ht 67.0 in | Wt 133.8 lb

## 2016-01-25 DIAGNOSIS — N946 Dysmenorrhea, unspecified: Secondary | ICD-10-CM

## 2016-01-25 DIAGNOSIS — Z113 Encounter for screening for infections with a predominantly sexual mode of transmission: Secondary | ICD-10-CM | POA: Diagnosis not present

## 2016-01-25 DIAGNOSIS — Z01419 Encounter for gynecological examination (general) (routine) without abnormal findings: Secondary | ICD-10-CM

## 2016-01-25 MED ORDER — IBUPROFEN 600 MG PO TABS: 600.0000 mg | ORAL_TABLET | Freq: Three times a day (TID) | ORAL | Status: DC | PRN

## 2016-01-25 NOTE — Progress Notes (Addendum)
Veronica Mclean 01/12/1996 [id number]    History:    Presents for annual exam.  Regular monthly 5 day cycles until last month when 2 cycles two weeks apart, reports mild cramping with cycles.  No longer on Depo-Provera.  New partner x2 months/inconsistent condom use, consistent withdrawal.  History of Chlamydia 2012, 2014, and 2016, Gonorrhea 2012 and 2016.  Completed Gardasil series.  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  Past medical history, past surgical history, family history and social history were all reviewed and documented in the EPIC chart.  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  Works at [employer].  Mother healthy.  ROS:  A ROS was performed and pertinent positives and negatives are included.  Exam:  Filed Vitals:   01/25/16 1613  BP: 112/72    General appearance:  Normal, well-appearing. Thyroid:  Symmetrical, normal in size, without palpable masses or nodularity. Respiratory  Auscultation:  Clear without wheezing or rhonchi Cardiovascular  Auscultation:  Regular rate, without rubs, murmurs or gallops  Edema/varicosities:  Not grossly evident Abdominal  Soft,nontender, without masses, guarding or rebound.  Liver/spleen:  No organomegaly noted  Hernia:  None appreciated  Skin  Inspection:  Grossly normal   Breasts: Examined lying and sitting.     Right: Without masses, retractions, discharge or axillary adenopathy.     Left: Without masses, retractions, discharge or axillary adenopathy. Gentitourinary   Inguinal/mons:  Normal without inguinal adenopathy  External genitalia:  Normal  BUS/Urethra/Skene's glands:  Normal  Vagina:  Normal  Cervix:  Normal  Uterus:  Normal in size, shape and contour.  Midline and mobile  Adnexa/parametria:     Rt: Without masses or tenderness.   Lt: Without masses or tenderness.  Anus and perineum: Normal    Assessment/Plan:  20 y.o. SBF G0 for  annual exam with no complaints.  Monthly cycles with one cycle less than 21 days  Dysmenorrhea STD screen HSV-1 rare outbreaks Labs-primary care  Plan:  SBE's, encouraged regular exercise, healthy diet, MVI.  Instructed to call if cycles are less than 21 days . Discussed options for contraception, declines will continue condoms, pregnancy okay.  Motrin 600mg  every 8hrs as needed cycles.  Valtrex 500mg  prescription given.   GC/Chl, HIV, RPR, Hep B, Hep C, Pap.   Harrington ChallengerYOUNG,NANCY J B[facility] Center Tuc[facility] CampusWHNP, 4:51 PM 01/25/2016

## 2016-01-25 NOTE — Patient Instructions (Signed)
Health Maintenance, Female Adopting a healthy lifestyle and getting preventive care can go a long way to promote health and wellness. Talk with your health care provider about what schedule of regular examinations is right for you. This is a good chance for you to check in with your provider about disease prevention and staying healthy. In between checkups, there are plenty of things you can do on your own. Experts have done a lot of research about which lifestyle changes and preventive measures are most likely to keep you healthy. Ask your health care provider for more information. WEIGHT AND DIET  Eat a healthy diet  Be sure to include plenty of vegetables, fruits, low-fat dairy products, and lean protein.  Do not eat a lot of foods high in solid fats, added sugars, or salt.  Get regular exercise. This is one of the most important things you can do for your health.  Most adults should exercise for at least 150 minutes each week. The exercise should increase your heart rate and make you sweat (moderate-intensity exercise).  Most adults should also do strengthening exercises at least twice a week. This is in addition to the moderate-intensity exercise.  Maintain a healthy weight  Body mass index (BMI) is a measurement that can be used to identify possible weight problems. It estimates body fat based on height and weight. Your health care provider can help determine your BMI and help you achieve or maintain a healthy weight.  For females 20 years of age and older:   A BMI below 18.5 is considered underweight.  A BMI of 18.5 to 24.9 is normal.  A BMI of 25 to 29.9 is considered overweight.  A BMI of 30 and above is considered obese.  Watch levels of cholesterol and blood lipids  You should start having your blood tested for lipids and cholesterol at 20 years of age, then have this test every 5 years.  You may need to have your cholesterol levels checked more often if:  Your lipid  or cholesterol levels are high.  You are older than 21 years of age.  You are at high risk for heart disease.  CANCER SCREENING   Lung Cancer  Lung cancer screening is recommended for adults 55-80 years old who are at high risk for lung cancer because of a history of smoking.  A yearly low-dose CT scan of the lungs is recommended for people who:  Currently smoke.  Have quit within the past 15 years.  Have at least a 30-pack-year history of smoking. A pack year is smoking an average of one pack of cigarettes a day for 1 year.  Yearly screening should continue until it has been 15 years since you quit.  Yearly screening should stop if you develop a health problem that would prevent you from having lung cancer treatment.  Breast Cancer  Practice breast self-awareness. This means understanding how your breasts normally appear and feel.  It also means doing regular breast self-exams. Let your health care provider know about any changes, no matter how small.  If you are in your 20s or 30s, you should have a clinical breast exam (CBE) by a health care provider every 1-3 years as part of a regular health exam.  If you are 40 or older, have a CBE every year. Also consider having a breast X-ray (mammogram) every year.  If you have a family history of breast cancer, talk to your health care provider about genetic screening.  If you   are at high risk for breast cancer, talk to your health care provider about having an MRI and a mammogram every year.  Breast cancer gene (BRCA) assessment is recommended for women who have family members with BRCA-related cancers. BRCA-related cancers include:  Breast.  Ovarian.  Tubal.  Peritoneal cancers.  Results of the assessment will determine the need for genetic counseling and BRCA1 and BRCA2 testing. Cervical Cancer Your health care provider may recommend that you be screened regularly for cancer of the pelvic organs (ovaries, uterus, and  vagina). This screening involves a pelvic examination, including checking for microscopic changes to the surface of your cervix (Pap test). You may be encouraged to have this screening done every 3 years, beginning at age 21.  For women ages 30-65, health care providers may recommend pelvic exams and Pap testing every 3 years, or they may recommend the Pap and pelvic exam, combined with testing for human papilloma virus (HPV), every 5 years. Some types of HPV increase your risk of cervical cancer. Testing for HPV may also be done on women of any age with unclear Pap test results.  Other health care providers may not recommend any screening for nonpregnant women who are considered low risk for pelvic cancer and who do not have symptoms. Ask your health care provider if a screening pelvic exam is right for you.  If you have had past treatment for cervical cancer or a condition that could lead to cancer, you need Pap tests and screening for cancer for at least 20 years after your treatment. If Pap tests have been discontinued, your risk factors (such as having a new sexual partner) need to be reassessed to determine if screening should resume. Some women have medical problems that increase the chance of getting cervical cancer. In these cases, your health care provider may recommend more frequent screening and Pap tests. Colorectal Cancer  This type of cancer can be detected and often prevented.  Routine colorectal cancer screening usually begins at 21 years of age and continues through 21 years of age.  Your health care provider may recommend screening at an earlier age if you have risk factors for colon cancer.  Your health care provider may also recommend using home test kits to check for hidden blood in the stool.  A small camera at the end of a tube can be used to examine your colon directly (sigmoidoscopy or colonoscopy). This is done to check for the earliest forms of colorectal  cancer.  Routine screening usually begins at age 50.  Direct examination of the colon should be repeated every 5-10 years through 21 years of age. However, you may need to be screened more often if early forms of precancerous polyps or small growths are found. Skin Cancer  Check your skin from head to toe regularly.  Tell your health care provider about any new moles or changes in moles, especially if there is a change in a mole's shape or color.  Also tell your health care provider if you have a mole that is larger than the size of a pencil eraser.  Always use sunscreen. Apply sunscreen liberally and repeatedly throughout the day.  Protect yourself by wearing long sleeves, pants, a wide-brimmed hat, and sunglasses whenever you are outside. HEART DISEASE, DIABETES, AND HIGH BLOOD PRESSURE   High blood pressure causes heart disease and increases the risk of stroke. High blood pressure is more likely to develop in:  People who have blood pressure in the high end   of the normal range (130-139/85-89 mm Hg).  People who are overweight or obese.  People who are African American.  If you are 38-23 years of age, have your blood pressure checked every 3-5 years. If you are 61 years of age or older, have your blood pressure checked every year. You should have your blood pressure measured twice--once when you are at a hospital or clinic, and once when you are not at a hospital or clinic. Record the average of the two measurements. To check your blood pressure when you are not at a hospital or clinic, you can use:  An automated blood pressure machine at a pharmacy.  A home blood pressure monitor.  If you are between 45 years and 39 years old, ask your health care provider if you should take aspirin to prevent strokes.  Have regular diabetes screenings. This involves taking a blood sample to check your fasting blood sugar level.  If you are at a normal weight and have a low risk for diabetes,  have this test once every three years after 21 years of age.  If you are overweight and have a high risk for diabetes, consider being tested at a younger age or more often. PREVENTING INFECTION  Hepatitis B  If you have a higher risk for hepatitis B, you should be screened for this virus. You are considered at high risk for hepatitis B if:  You were born in a country where hepatitis B is common. Ask your health care provider which countries are considered high risk.  Your parents were born in a high-risk country, and you have not been immunized against hepatitis B (hepatitis B vaccine).  You have HIV or AIDS.  You use needles to inject street drugs.  You live with someone who has hepatitis B.  You have had sex with someone who has hepatitis B.  You get hemodialysis treatment.  You take certain medicines for conditions, including cancer, organ transplantation, and autoimmune conditions. Hepatitis C  Blood testing is recommended for:  Everyone born from [birth year] through 1966.  Anyone with known risk factors for hepatitis C. Sexually transmitted infections (STIs)  You should be screened for sexually transmitted infections (STIs) including gonorrhea and chlamydia if:  You are sexually active and are younger than 21 years of age.  You are older than 21 years of age and your health care provider tells you that you are at risk for this type of infection.  Your sexual activity has changed since you were last screened and you are at an increased risk for chlamydia or gonorrhea. Ask your health care provider if you are at risk.  If you do not have HIV, but are at risk, it may be recommended that you take a prescription medicine daily to prevent HIV infection. This is called pre-exposure prophylaxis (PrEP). You are considered at risk if:  You are sexually active and do not regularly use condoms or know the HIV status of your partner(s).  You take drugs by injection.  You are sexually  active with a partner who has HIV. Talk with your health care provider about whether you are at high risk of being infected with HIV. If you choose to begin PrEP, you should first be tested for HIV. You should then be tested every 3 months for as long as you are taking PrEP.  PREGNANCY   If you are premenopausal and you may become pregnant, ask your health care provider about preconception counseling.  If you may  become pregnant, take 400 to 800 micrograms (mcg) of folic acid every day.  If you want to prevent pregnancy, talk to your health care provider about birth control (contraception). OSTEOPOROSIS AND MENOPAUSE   Osteoporosis is a disease in which the bones lose minerals and strength with aging. This can result in serious bone fractures. Your risk for osteoporosis can be identified using a bone density scan.  If you are 61 years of age or older, or if you are at risk for osteoporosis and fractures, ask your health care provider if you should be screened.  Ask your health care provider whether you should take a calcium or vitamin D supplement to lower your risk for osteoporosis.  Menopause may have certain physical symptoms and risks.  Hormone replacement therapy may reduce some of these symptoms and risks. Talk to your health care provider about whether hormone replacement therapy is right for you.  HOME CARE INSTRUCTIONS   Schedule regular health, dental, and eye exams.  Stay current with your immunizations.   Do not use any tobacco products including cigarettes, chewing tobacco, or electronic cigarettes.  If you are pregnant, do not drink alcohol.  If you are breastfeeding, limit how much and how often you drink alcohol.  Limit alcohol intake to no more than 1 drink per day for nonpregnant women. One drink equals 12 ounces of beer, 5 ounces of wine, or 1 ounces of hard liquor.  Do not use street drugs.  Do not share needles.  Ask your health care provider for help if  you need support or information about quitting drugs.  Tell your health care provider if you often feel depressed.  Tell your health care provider if you have ever been abused or do not feel safe at home.   This information is not intended to replace advice given to you by your health care provider. Make sure you discuss any questions you have with your health care provider.   Document Released: 01/21/2011 Document Revised: 07/29/2014 Document Reviewed: 06/09/2013 Elsevier Interactive Patient Education [employer].

## 2016-01-26 LAB — GC/CHLAMYDIA PROBE AMP
CT Probe RNA: NOT DETECTED
GC Probe RNA: NOT DETECTED

## 2016-01-26 LAB — PAP IG W/ RFLX HPV ASCU

## 2016-01-26 LAB — HEPATITIS B SURFACE ANTIGEN: HEP B S AG: NEGATIVE

## 2016-01-26 LAB — HEPATITIS C ANTIBODY: HCV Ab: NEGATIVE

## 2016-01-26 LAB — HIV ANTIBODY (ROUTINE TESTING W REFLEX): HIV 1&2 Ab, 4th Generation: NONREACTIVE

## 2016-01-26 LAB — RPR

## 2016-02-06 ENCOUNTER — Ambulatory Visit (INDEPENDENT_AMBULATORY_CARE_PROVIDER_SITE_OTHER): Payer: BLUE CROSS/BLUE SHIELD | Admitting: Women's Health

## 2016-02-06 ENCOUNTER — Encounter: Payer: Self-pay | Admitting: Women's Health

## 2016-02-06 ENCOUNTER — Other Ambulatory Visit: Payer: Self-pay | Admitting: Women's Health

## 2016-02-06 VITALS — BP 110/78 | Ht 67.0 in | Wt 133.0 lb

## 2016-02-06 DIAGNOSIS — B9689 Other specified bacterial agents as the cause of diseases classified elsewhere: Secondary | ICD-10-CM

## 2016-02-06 DIAGNOSIS — N3000 Acute cystitis without hematuria: Secondary | ICD-10-CM

## 2016-02-06 DIAGNOSIS — A499 Bacterial infection, unspecified: Secondary | ICD-10-CM | POA: Diagnosis not present

## 2016-02-06 DIAGNOSIS — N760 Acute vaginitis: Secondary | ICD-10-CM | POA: Diagnosis not present

## 2016-02-06 DIAGNOSIS — R300 Dysuria: Secondary | ICD-10-CM

## 2016-02-06 LAB — URINALYSIS W MICROSCOPIC + REFLEX CULTURE
BILIRUBIN URINE: NEGATIVE
CASTS: NONE SEEN [LPF]
CRYSTALS: NONE SEEN [HPF]
GLUCOSE, UA: NEGATIVE
Nitrite: POSITIVE — AB
Specific Gravity, Urine: 1.02 (ref 1.001–1.035)
Yeast: NONE SEEN [HPF]
pH: 7.5 (ref 5.0–8.0)

## 2016-02-06 LAB — WET PREP FOR TRICH, YEAST, CLUE
TRICH WET PREP: NONE SEEN
YEAST WET PREP: NONE SEEN

## 2016-02-06 MED ORDER — SULFAMETHOXAZOLE-TRIMETHOPRIM 800-160 MG PO TABS: 1.0000 | ORAL_TABLET | Freq: Two times a day (BID) | ORAL | Status: DC

## 2016-02-06 MED ORDER — METRONIDAZOLE 500 MG PO TABS: 500.0000 mg | ORAL_TABLET | Freq: Two times a day (BID) | ORAL | Status: DC

## 2016-02-06 NOTE — Progress Notes (Signed)
Patient ID: Arta SilenceZynovia Jewell Vonbargen, female   DOB: 09/18/1995, 20 y.o.   MRN: [id number] Presents with complaint of increased urinary frequency with urgency, some burning with urination, mild low abdominal pain for several days. White discharge with odor, denies itching. Denies fever. Same partner negative STD screen this month. Monthly cycle condoms inconsistently pregnancy okay.  Exam: Appears well. External genitalia within normal limits, speculum exam moderate amount of a watery discharge with odor, wet prep positive for moderate clues, TNTC bacteria. UA: +1 blood, positive nitrites, +1 leukocytes, greater than 60 WBCs, 20-40 RBCs, many bacteria  Bacteria vaginosis UTI  Plan: Contraception options reviewed and declines will continue condoms. Septra twice daily for 3-5 days, prescription, proper use given and reviewed UTI prevention. Urine culture pending. Flagyl 500 twice daily for 7 days alcohol precautions reviewed, instructed to call if no relief of symptoms.

## 2016-02-06 NOTE — Addendum Note (Signed)
Addended by: Kem ParkinsonBARNES, Emmanuelle Hibbitts on: 02/06/2016 04:45 PM   Modules accepted: Orders, SmartSet

## 2016-02-06 NOTE — Patient Instructions (Signed)
Bacterial Vaginosis °Bacterial vaginosis is a vaginal infection that occurs when the normal balance of bacteria in the vagina is disrupted. It results from an overgrowth of certain bacteria. This is the most common vaginal infection in women of childbearing age. Treatment is important to prevent complications, especially in pregnant women, as it can cause a premature delivery. °CAUSES  °Bacterial vaginosis is caused by an increase in harmful bacteria that are normally present in smaller amounts in the vagina. Several different kinds of bacteria can cause bacterial vaginosis. However, the reason that the condition develops is not fully understood. °RISK FACTORS °Certain activities or behaviors can put you at an increased risk of developing bacterial vaginosis, including: °· Having a new sex partner or multiple sex partners. °· Douching. °· Using an intrauterine device (IUD) for contraception. °Women do not get bacterial vaginosis from toilet seats, bedding, swimming pools, or contact with objects around them. °SIGNS AND SYMPTOMS  °Some women with bacterial vaginosis have no signs or symptoms. Common symptoms include: °· Grey vaginal discharge. °· A fishlike odor with discharge, especially after sexual intercourse. °· Itching or burning of the vagina and vulva. °· Burning or pain with urination. °DIAGNOSIS  °Your health care provider will take a medical history and examine the vagina for signs of bacterial vaginosis. A sample of vaginal fluid may be taken. Your health care provider will look at this sample under a microscope to check for bacteria and abnormal cells. A vaginal pH test may also be done.  °TREATMENT  °Bacterial vaginosis may be treated with antibiotic medicines. These may be given in the form of a pill or a vaginal cream. A second round of antibiotics may be prescribed if the condition comes back after treatment. Because bacterial vaginosis increases your risk for sexually transmitted diseases, getting  treated can help reduce your risk for chlamydia, gonorrhea, HIV, and herpes. °HOME CARE INSTRUCTIONS  °· Only take over-the-counter or prescription medicines as directed by your health care provider. °· If antibiotic medicine was prescribed, take it as directed. Make sure you finish it even if you start to feel better. °· Tell all sexual partners that you have a vaginal infection. They should see their health care provider and be treated if they have problems, such as a mild rash or itching. °· During treatment, it is important that you follow these instructions: °· Avoid sexual activity or use condoms correctly. °· Do not douche. °· Avoid alcohol as directed by your health care provider. °· Avoid breastfeeding as directed by your health care provider. °SEEK MEDICAL CARE IF:  °· Your symptoms are not improving after 3 days of treatment. °· You have increased discharge or pain. °· You have a fever. °MAKE SURE YOU:  °· Understand these instructions. °· Will watch your condition. °· Will get help right away if you are not doing well or get worse. °FOR MORE INFORMATION  °Centers for Disease Control and Prevention, Division of STD Prevention: www.cdc.gov/std °American Sexual Health Association (ASHA): www.ashastd.org  °  °This information is not intended to replace advice given to you by your health care provider. Make sure you discuss any questions you have with your health care provider. °  °Document Released: 07/08/2005 Document Revised: 07/29/2014 Document Reviewed: 02/17/2013 °Elsevier Interactive Patient Education ©2016 Elsevier Inc. ° °Urinary Tract Infection °Urinary tract infections (UTIs) can develop anywhere along your urinary tract. Your urinary tract is your body's drainage system for removing wastes and extra water. Your urinary tract includes two kidneys, two ureters,   a bladder, and a urethra. Your kidneys are a pair of bean-shaped organs. Each kidney is about the size of your fist. They are located below  your ribs, one on each side of your spine. °CAUSES °Infections are caused by microbes, which are microscopic organisms, including fungi, viruses, and bacteria. These organisms are so small that they can only be seen through a microscope. Bacteria are the microbes that most commonly cause UTIs. °SYMPTOMS  °Symptoms of UTIs may vary by age and gender of the patient and by the location of the infection. Symptoms in Veronica Mclean women typically include a frequent and intense urge to urinate and a painful, burning feeling in the bladder or urethra during urination. Older women and men are more likely to be tired, shaky, and weak and have muscle aches and abdominal pain. A fever may mean the infection is in your kidneys. Other symptoms of a kidney infection include pain in your back or sides below the ribs, nausea, and vomiting. °DIAGNOSIS °To diagnose a UTI, your caregiver will ask you about your symptoms. Your caregiver will also ask you to provide a urine sample. The urine sample will be tested for bacteria and white blood cells. White blood cells are made by your body to help fight infection. °TREATMENT  °Typically, UTIs can be treated with medication. Because most UTIs are caused by a bacterial infection, they usually can be treated with the use of antibiotics. The choice of antibiotic and length of treatment depend on your symptoms and the type of bacteria causing your infection. °HOME CARE INSTRUCTIONS °· If you were prescribed antibiotics, take them exactly as your caregiver instructs you. Finish the medication even if you feel better after you have only taken some of the medication. °· Drink enough water and fluids to keep your urine clear or pale yellow. °· Avoid caffeine, tea, and carbonated beverages. They tend to irritate your bladder. °· Empty your bladder often. Avoid holding urine for long periods of time. °· Empty your bladder before and after sexual intercourse. °· After a bowel movement, women should cleanse  from front to back. Use each tissue only once. °SEEK MEDICAL CARE IF:  °· You have back pain. °· You develop a fever. °· Your symptoms do not begin to resolve within 3 days. °SEEK IMMEDIATE MEDICAL CARE IF:  °· You have severe back pain or lower abdominal pain. °· You develop chills. °· You have nausea or vomiting. °· You have continued burning or discomfort with urination. °MAKE SURE YOU:  °· Understand these instructions. °· Will watch your condition. °· Will get help right away if you are not doing well or get worse. °  °This information is not intended to replace advice given to you by your health care provider. Make sure you discuss any questions you have with your health care provider. °  °Document Released: 04/17/2005 Document Revised: 03/29/2015 Document Reviewed: 08/16/2011 °Elsevier Interactive Patient Education ©2016 Elsevier Inc. ° °

## 2016-02-07 ENCOUNTER — Encounter: Payer: Self-pay | Admitting: *Deleted

## 2016-02-09 LAB — URINE CULTURE

## 2016-03-05 ENCOUNTER — Telehealth: Payer: Self-pay

## 2016-03-05 NOTE — Telephone Encounter (Signed)
Patient called stating she had sex with her boyfriend the day after her period ended. Then like a week later she had blood mucousy discharge.  I recommended office visit.  Patient said her schedule really busy right now and she would need to check her calendar and she will call back after class today to schedule o[place] with [place].

## 2016-07-13 ENCOUNTER — Encounter (HOSPITAL_COMMUNITY): Payer: Self-pay | Admitting: Oncology

## 2016-07-13 ENCOUNTER — Emergency Department (HOSPITAL_COMMUNITY)
Admission: EM | Admit: 2016-07-13 | Discharge: 2016-07-14 | Disposition: A | Discharge status: Home / Self Care | Payer: BLUE CROSS/BLUE SHIELD | Attending: Emergency Medicine | Admitting: Emergency Medicine

## 2016-07-13 DIAGNOSIS — R112 Nausea with vomiting, unspecified: Secondary | ICD-10-CM | POA: Diagnosis present

## 2016-07-13 DIAGNOSIS — R1030 Lower abdominal pain, unspecified: Secondary | ICD-10-CM | POA: Insufficient documentation

## 2016-07-13 DIAGNOSIS — F17290 Nicotine dependence, other tobacco product, uncomplicated: Secondary | ICD-10-CM | POA: Diagnosis not present

## 2016-07-13 LAB — COMPREHENSIVE METABOLIC PANEL
ALBUMIN: 4.1 g/dL (ref 3.5–5.0)
ALK PHOS: 37 U/L — AB (ref 38–126)
ALT: 10 U/L — ABNORMAL LOW (ref 14–54)
AST: 18 U/L (ref 15–41)
Anion gap: 7 (ref 5–15)
BUN: 15 mg/dL (ref 6–20)
CALCIUM: 9.3 mg/dL (ref 8.9–10.3)
CO2: 27 mmol/L (ref 22–32)
CREATININE: 0.84 mg/dL (ref 0.44–1.00)
Chloride: 107 mmol/L (ref 101–111)
GFR calc Af Amer: >60 mL/min (ref >60)
GFR calc non Af Amer: >60 mL/min (ref >60)
GLUCOSE: 87 mg/dL (ref 65–99)
Potassium: 4.3 mmol/L (ref 3.5–5.1)
SODIUM: 141 mmol/L (ref 135–145)
Total Bilirubin: 0.8 mg/dL (ref 0.3–1.2)
Total Protein: 7.1 g/dL (ref 6.5–8.1)

## 2016-07-13 LAB — URINALYSIS, ROUTINE W REFLEX MICROSCOPIC
BILIRUBIN URINE: NEGATIVE
GLUCOSE, UA: NEGATIVE mg/dL
HGB URINE DIPSTICK: NEGATIVE
Ketones, ur: NEGATIVE mg/dL
Leukocytes, UA: NEGATIVE
Nitrite: NEGATIVE
PROTEIN: NEGATIVE mg/dL
Specific Gravity, Urine: 1.024 (ref 1.005–1.030)
pH: 7 (ref 5.0–8.0)

## 2016-07-13 LAB — CBC
HCT: 39.2 % (ref 36.0–46.0)
Hemoglobin: 13.2 g/dL (ref 12.0–15.0)
MCH: 28.8 pg (ref 26.0–34.0)
MCHC: 33.7 g/dL (ref 30.0–36.0)
MCV: 85.6 fL (ref 78.0–100.0)
PLATELETS: 156 10*3/uL (ref 150–400)
RBC: 4.58 MIL/uL (ref 3.87–5.11)
RDW: 12.7 % (ref 11.5–15.5)
WBC: 4.9 10*3/uL (ref 4.0–10.5)

## 2016-07-13 LAB — POC URINE PREG, ED: PREG TEST UR: NEGATIVE

## 2016-07-13 LAB — LIPASE, BLOOD: Lipase: 35 U/L (ref 11–51)

## 2016-07-13 MED ORDER — ONDANSETRON 4 MG PO TBDP: 4.0000 mg | ORAL_TABLET | Freq: Three times a day (TID) | ORAL | 0 refills | Status: DC | PRN

## 2016-07-13 MED ORDER — ONDANSETRON 4 MG PO TBDP
4.0000 mg | ORAL_TABLET | Freq: Once | ORAL | Status: AC
  Administered 2016-07-13: 4 mg via ORAL
  Filled 2016-07-13: qty 1

## 2016-07-13 NOTE — ED Notes (Signed)
Patient states she does not want a pelvic exam and wants to be discharged. She will follow-up with her PCP next week. States her mother told her to leave. Informed her to stay until the doctor is made aware and can decide on further actions. Pt verbalized understanding and agreed.

## 2016-07-13 NOTE — ED Triage Notes (Signed)
Pt reports intermittent emesis and constant nausea since Tuesday.  Pt states she has taken a home pregnancy test however her mother told her she needed to come have one done.  Pt is A&O x 4.  Reports intermittent lower abdominal.  Denies GU sx.

## 2016-07-13 NOTE — ED Notes (Signed)
Patient states she started feeling nauseated about a week ago. The nausea was continuous but now just comes and goes. Last episode of emesis was yesterday and per her it was a small amount. No diarrhea. Currently denies nausea or vomiting. States she feels fine but wanted to make sure that it didn't come back. States " I was eating chips and drinking a soda in the lobby while waiting and that settled fine on my stomach, so I don't think anything serious is going on."

## 2016-07-13 NOTE — ED Provider Notes (Signed)
[facility]-EMERGENCY DEPT Provider Note   CSN: [id number] Arrival date & time: 07/13/16  1939   By signing my name below, I, Clarisse GougeXavier Herndon, attest that this documentation has been prepared under the direction and in the presence of Tilden FossaElizabeth Basia Mcginty, MD. Electronically signed, Clarisse GougeXavier Herndon, ED Scribe. 07/14/16. 6:06 AM.   History   Chief Complaint Chief Complaint  Patient presents with  . Abdominal Pain  . Emesis   The history is provided by the patient. No language interpreter was used.    HPI Comments: Veronica Mclean is a 20 y.o. female who presents to the Emergency Department complaining of constant, gradually worsening nausea x 5 days. Pt reports associated intermittent mild lower abdominal pain and emesis (1-2 times per day). She states that she has felt similar symptoms before prior to her menstrual cycle, but not like her current symptoms this soon after her cycle. Pt denies fever, diarrhea, dysuria or discharge. NKDA. LNMP 06/26/16.    Past Medical History:  Diagnosis Date  . Chlamydia 09/2010, 01/2011, 12/2014  . Gonorrhea 09/2010, 12/2014  . HSV infection     Patient Active Problem List   Diagnosis Date Noted  . 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 BV (bacterial vaginosis) 11/25/2012    History reviewed. No pertinent surgical history.  OB History    Gravida Para Term Preterm AB Living   0             SAB TAB Ectopic Multiple Live Births                   Home Medications    Prior to Admission medications   Medication Sig Start Date End Date Taking? Authorizing Provider  ibuprofen (ADVIL,MOTRIN) 600 MG tablet Take 1 tablet (600 mg total) by mouth every 8 (eight) hours as needed. Patient not taking: Reported on 07/13/2016 01/25/16   Harrington ChallengerNancy J Young, NP  metroNIDAZOLE (FLAGYL) 500 MG tablet Take 1 tablet (500 mg total) by mouth 2 (two) times daily. Patient not taking: Reported on 07/13/2016 02/06/16   Harrington ChallengerNancy J Young, NP  ondansetron (ZOFRAN ODT)  4 MG disintegrating tablet Take 1 tablet (4 mg total) by mouth every 8 (eight) hours as needed for nausea or vomiting. 07/13/16   Tilden FossaElizabeth Gayle Martinez, MD  sulfamethoxazole-trimethoprim (BACTRIM DS) 800-160 MG tablet Take 1 tablet by mouth 2 (two) times daily. Patient not taking: Reported on 07/13/2016 02/06/16   Harrington ChallengerNancy J Young, NP    Family History No family history on file.  Social History Social History  Substance Use Topics  . Smoking status: Current Every Day Smoker    Types: Cigars  . Smokeless tobacco: Never Used  . Alcohol use 0.0 oz/week     Comment: occ     Allergies   Patient has no known allergies.   Review of Systems Review of Systems  All other systems reviewed and are negative.  A complete 10 system review of systems was obtained and all systems are negative except as noted in the HPI and PMH.    Physical Exam Updated Vital Signs BP 113/68 (BP Location: Right Arm)   Pulse 69   Temp 98.2 F (36.8 C) (Oral)   Resp 18   Ht 5\' 7"  (1.702 m)   Wt 142 lb (64.4 kg)   LMP 06/26/2016 (Approximate)   SpO2 99%   BMI 22.24 kg/m   Physical Exam  Constitutional: She is oriented to person, place, and time. She appears well-developed and well-nourished.  HENT:  Head: Normocephalic and atraumatic.  Cardiovascular: Normal rate and regular rhythm.   No murmur heard. Pulmonary/Chest: Effort normal and breath sounds normal. No respiratory distress.  Abdominal: Soft. There is tenderness. There is no rebound and no guarding.  Mild suprapubic tenderness.  Musculoskeletal: She exhibits no edema or tenderness.  Neurological: She is alert and oriented to person, place, and time.  Skin: Skin is warm and dry.  Psychiatric: She has a normal mood and affect. Her behavior is normal.  Nursing note and vitals reviewed.    ED Treatments / Results  DIAGNOSTIC STUDIES: Oxygen Saturation is 100% on RA, normal by my interpretation.    COORDINATION OF CARE: 6:06 AM Pt advised to get a  pelvic exam. Discussed treatment plan with pt at bedside and pt agreed to plan.  Labs (all labs ordered are listed, but only abnormal results are displayed) Labs Reviewed  COMPREHENSIVE METABOLIC PANEL - Abnormal; Notable for the following:       Result Value   ALT 10 (*)    Alkaline Phosphatase 37 (*)    All other components within normal limits  WET PREP, GENITAL  LIPASE, BLOOD  CBC  URINALYSIS, ROUTINE W REFLEX MICROSCOPIC  POC URINE PREG, ED  GC/CHLAMYDIA PROBE AMP ([place]    EKG  EKG Interpretation None       Radiology No results found.  Procedures Procedures (including critical care time)  Medications Ordered in ED Medications  ondansetron (ZOFRAN-ODT) disintegrating tablet 4 mg (4 mg Oral Given 07/13/16 2340)     Initial Impression / Assessment and Plan / ED Course  I have reviewed the triage vital signs and the nursing notes.  Pertinent labs & imaging results that were available during my care of the patient were reviewed by me and considered in my medical decision making (see chart for details).  Clinical Course     Patient here for evaluation of lower abdominal pain and nausea. She has minimal tenderness on examination. Discussed recommendation for pelvic examination to evaluate for pelvic source of her symptoms. Patient declines examination in the emergency department. Incomplete workup and need for further evaluation to identify cause of her abdominal pain. Current clinical picture is not consistent with appendicitis. Discussed the importance of outpatient follow-up as well as ED follow-up. Home care return precautions discussed.  Final Clinical Impressions(s) / ED Diagnoses   Final diagnoses:  Non-intractable vomiting with nausea, unspecified vomiting type  Lower abdominal pain    New Prescriptions Discharge Medication List as of 07/13/2016 11:50 PM    START taking these medications   Details  ondansetron (ZOFRAN ODT) 4 MG  disintegrating tablet Take 1 tablet (4 mg total) by mouth every 8 (eight) hours as needed for nausea or vomiting., Starting Sat 07/13/2016, Print      I personally performed the services described in this documentation, which was scribed in my presence. The recorded information has been reviewed and is accurate.    Tilden FossaElizabeth Lea Baine, MD 07/14/16 [phone]

## 2016-07-13 NOTE — ED Notes (Signed)
Patient denies pain and is resting comfortably.  

## 2016-08-06 ENCOUNTER — Ambulatory Visit (INDEPENDENT_AMBULATORY_CARE_PROVIDER_SITE_OTHER): Payer: BLUE CROSS/BLUE SHIELD | Admitting: Adult Health

## 2016-08-06 ENCOUNTER — Encounter: Payer: Self-pay | Admitting: Adult Health

## 2016-08-06 VITALS — BP 116/72 | Temp 98.2°F | Ht 67.0 in | Wt 142.4 lb

## 2016-08-06 DIAGNOSIS — J0100 Acute maxillary sinusitis, unspecified: Secondary | ICD-10-CM | POA: Diagnosis not present

## 2016-08-06 MED ORDER — DOXYCYCLINE HYCLATE 100 MG PO CAPS: 100.0000 mg | ORAL_CAPSULE | Freq: Two times a day (BID) | ORAL | 0 refills | Status: DC

## 2016-08-06 MED ORDER — METHYLPREDNISOLONE 4 MG PO TBPK: ORAL_TABLET | ORAL | 0 refills | Status: DC

## 2016-08-06 NOTE — Progress Notes (Signed)
Subjective:    Patient ID: Veronica Mclean, female    DOB: [birth year], 21 y.o.   MRN: [id number]  Sinusitis  This is a new problem. The current episode started 1 to 4 weeks ago (2 weeks ). The problem has been waxing and waning since onset. There has been no fever. Associated symptoms include chills, congestion, coughing (productive ), ear pain, headaches, sinus pressure and a sore throat. Pertinent negatives include no shortness of breath. Treatments tried: Dayquil  The treatment provided no relief.    Review of Systems  Constitutional: Positive for chills and fatigue.  HENT: Positive for congestion, ear pain, rhinorrhea, sinus pain, sinus pressure and sore throat. Negative for postnasal drip, trouble swallowing and voice change.   Respiratory: Positive for cough (productive ) and wheezing (at night ). Negative for shortness of breath.   Cardiovascular: Negative.   Gastrointestinal: Negative.   Genitourinary: Negative.   Musculoskeletal: Negative.   Neurological: Positive for headaches.     Past Medical History:  Diagnosis Date  . Chlamydia 09/2010, 01/2011, 12/2014  . Gonorrhea 09/2010, 12/2014  . HSV infection     Social History   Social History  . Marital status: Single    Spouse name: N/A  . Number of children: N/A  . Years of education: N/A   Occupational History  . Not on file.   Social History Main Topics  . Smoking status: Current Every Day Smoker    Types: Cigars  . Smokeless tobacco: Never Used  . Alcohol use 0.0 oz/week     Comment: occ  . Drug use: No  . Sexual activity: Yes    Birth control/ protection: Condom   Other Topics Concern  . Not on file   Social History Narrative   [employer]   Works at [employer].          No past surgical history on file.  No family history on file.  No Known Allergies  Current Outpatient Prescriptions on File Prior to Visit  Medication Sig Dispense Refill  . ibuprofen  (ADVIL,MOTRIN) 600 MG tablet Take 1 tablet (600 mg total) by mouth every 8 (eight) hours as needed. 60 tablet 1  . ondansetron (ZOFRAN ODT) 4 MG disintegrating tablet Take 1 tablet (4 mg total) by mouth every 8 (eight) hours as needed for nausea or vomiting. (Patient not taking: Reported on 08/06/2016) 8 tablet 0   No current facility-administered medications on file prior to visit.     BP 116/72   Temp 98.2 F (36.8 C) (Oral)   Ht 5\' 7"  (1.702 m)   Wt 142 lb 6.4 oz (64.6 kg)   LMP 06/26/2016 (Approximate)   BMI 22.30 kg/m        Objective:   Physical Exam  Constitutional: She is oriented to person, place, and time. She appears well-developed and well-nourished. No distress.  HENT:  Head: Normocephalic and atraumatic.  Right Ear: Hearing, tympanic membrane, external ear and ear canal normal.  Left Ear: Hearing, tympanic membrane, external ear and ear canal normal.  Nose: Mucosal edema and rhinorrhea present. Right sinus exhibits maxillary sinus tenderness. Right sinus exhibits no frontal sinus tenderness. Left sinus exhibits maxillary sinus tenderness.  Mouth/Throat: Oropharynx is clear and moist. No oropharyngeal exudate.  Eyes: Conjunctivae and EOM are normal. Pupils are equal, round, and reactive to light. Right eye exhibits no discharge. Left eye exhibits no discharge. No scleral icterus.  Neck: Normal range  of motion. Neck supple. No thyromegaly present.  Cardiovascular: Normal rate, regular rhythm, normal heart sounds and intact distal pulses.  Exam reveals no gallop and no friction rub.   No murmur heard. Pulmonary/Chest: Effort normal and breath sounds normal. No respiratory distress. She has no wheezes. She has no rales. She exhibits no tenderness.  Lymphadenopathy:    She has no cervical adenopathy.  Neurological: She is alert and oriented to person, place, and time.  Skin: Skin is warm and dry. No rash noted. She is not diaphoretic. No erythema. No pallor.  Psychiatric:  She has a normal mood and affect. Her behavior is normal. Judgment and thought content normal.  Vitals reviewed.     Assessment & Plan:  1. Acute maxillary sinusitis, recurrence not specified - Will treat as sinusitis.  - doxycycline (VIBRAMYCIN) 100 MG capsule; Take 1 capsule (100 mg total) by mouth 2 (two) times daily.  Dispense: 14 capsule; Refill: 0 - methylPREDNISolone (MEDROL DOSEPAK) 4 MG TBPK tablet; Take as directed  Dispense: 21 tablet; Refill: 0 - Add flonase - Stay hydrated and rest - Follow up if no improvement in the next 2-3 days   Shirline Freesory Rayanna Matusik, NP

## 2016-12-04 ENCOUNTER — Encounter: Payer: Self-pay | Admitting: Gynecology

## 2017-02-05 ENCOUNTER — Encounter: Payer: Self-pay | Admitting: Women's Health

## 2017-02-05 ENCOUNTER — Ambulatory Visit (INDEPENDENT_AMBULATORY_CARE_PROVIDER_SITE_OTHER): Payer: BLUE CROSS/BLUE SHIELD | Admitting: Women's Health

## 2017-02-05 VITALS — BP 120/82 | Ht 67.0 in | Wt 145.0 lb

## 2017-02-05 DIAGNOSIS — B9689 Other specified bacterial agents as the cause of diseases classified elsewhere: Secondary | ICD-10-CM

## 2017-02-05 DIAGNOSIS — N760 Acute vaginitis: Secondary | ICD-10-CM | POA: Diagnosis not present

## 2017-02-05 DIAGNOSIS — B3731 Acute candidiasis of vulva and vagina: Secondary | ICD-10-CM

## 2017-02-05 DIAGNOSIS — Z113 Encounter for screening for infections with a predominantly sexual mode of transmission: Secondary | ICD-10-CM

## 2017-02-05 DIAGNOSIS — B373 Candidiasis of vulva and vagina: Secondary | ICD-10-CM

## 2017-02-05 DIAGNOSIS — Z01419 Encounter for gynecological examination (general) (routine) without abnormal findings: Secondary | ICD-10-CM

## 2017-02-05 LAB — WET PREP FOR TRICH, YEAST, CLUE: TRICH WET PREP: NONE SEEN

## 2017-02-05 MED ORDER — FLUCONAZOLE 150 MG PO TABS: 150.0000 mg | ORAL_TABLET | Freq: Once | ORAL | 1 refills | Status: AC

## 2017-02-05 MED ORDER — METRONIDAZOLE 500 MG PO TABS: 500.0000 mg | ORAL_TABLET | Freq: Two times a day (BID) | ORAL | 0 refills | Status: DC

## 2017-02-05 NOTE — Patient Instructions (Signed)
Bacterial Vaginosis Bacterial vaginosis is an infection of the vagina. It happens when too many germs (bacteria) grow in the vagina. This infection puts you at risk for infections from sex (STIs). Treating this infection can lower your risk for some STIs. You should also treat this if you are pregnant. It can cause your baby to be born early. Follow these instructions at home: Medicines  Take over-the-counter and prescription medicines only as told by your doctor.  Take or use your antibiotic medicine as told by your doctor. Do not stop taking or using it even if you start to feel better. General instructions  If you your sexual partner is a woman, tell her that you have this infection. She needs to get treatment if she has symptoms. If you have a female partner, he does not need to be treated.  During treatment: ? Avoid sex. ? Do not douche. ? Avoid alcohol as told. ? Avoid breastfeeding as told.  Drink enough fluid to keep your pee (urine) clear or pale yellow.  Keep your vagina and butt (rectum) clean. ? Wash the area with warm water every day. ? Wipe from front to back after you use the toilet.  Keep all follow-up visits as told by your doctor. This is important. Preventing this condition  Do not douche.  Use only warm water to wash around your vagina.  Use protection when you have sex. This includes: ? Latex condoms. ? Dental dams.  Limit how many people you have sex with. It is best to only have sex with the same person (be monogamous).  Get tested for STIs. Have your partner get tested.  Wear underwear that is cotton or lined with cotton.  Avoid tight pants and pantyhose. This is most important in summer.  Do not use any products that have nicotine or tobacco in them. These include cigarettes and e-cigarettes. If you need help quitting, ask your doctor.  Do not use illegal drugs.  Limit how much alcohol you drink. Contact a doctor if:  Your symptoms do not get  better, even after you are treated.  You have more discharge or pain when you pee (urinate).  You have a fever.  You have pain in your belly (abdomen).  You have pain with sex.  Your bleed from your vagina between periods. Summary  This infection happens when too many germs (bacteria) grow in the vagina.  Treating this condition can lower your risk for some infections from sex (STIs).  You should also treat this if you are pregnant. It can cause early (premature) birth.  Do not stop taking or using your antibiotic medicine even if you start to feel better. This information is not intended to replace advice given to you by your health care provider. Make sure you discuss any questions you have with your health care provider. Document Released: 04/16/2008 Document Revised: 03/23/2016 Document Reviewed: 03/23/2016 Elsevier Interactive Patient Education  2017 Elsevier Inc. Health Maintenance, Female Adopting a healthy lifestyle and getting preventive care can go a long way to promote health and wellness. Talk with your health care provider about what schedule of regular examinations is right for you. This is a good chance for you to check in with your provider about disease prevention and staying healthy. In between checkups, there are plenty of things you can do on your own. Experts have done a lot of research about which lifestyle changes and preventive measures are most likely to keep you healthy. Ask your health care   provider for more information. Weight and diet Eat a healthy diet  Be sure to include plenty of vegetables, fruits, low-fat dairy products, and lean protein.  Do not eat a lot of foods high in solid fats, added sugars, or salt.  Get regular exercise. This is one of the most important things you can do for your health. ? Most adults should exercise for at least 150 minutes each week. The exercise should increase your heart rate and make you sweat (moderate-intensity  exercise). ? Most adults should also do strengthening exercises at least twice a week. This is in addition to the moderate-intensity exercise.  Maintain a healthy weight  Body mass index (BMI) is a measurement that can be used to identify possible weight problems. It estimates body fat based on height and weight. Your health care provider can help determine your BMI and help you achieve or maintain a healthy weight.  For females 36 years of age and older: ? A BMI below 18.5 is considered underweight. ? A BMI of 18.5 to 24.9 is normal. ? A BMI of 25 to 29.9 is considered overweight. ? A BMI of 30 and above is considered obese.  Watch levels of cholesterol and blood lipids  You should start having your blood tested for lipids and cholesterol at 22 years of age, then have this test every 5 years.  You may need to have your cholesterol levels checked more often if: ? Your lipid or cholesterol levels are high. ? You are older than 23 years of age. ? You are at high risk for heart disease.  Cancer screening Lung Cancer  Lung cancer screening is recommended for adults 39-35 years old who are at high risk for lung cancer because of a history of smoking.  A yearly low-dose CT scan of the lungs is recommended for people who: ? Currently smoke. ? Have quit within the past 15 years. ? Have at least a 30-pack-year history of smoking. A pack year is smoking an average of one pack of cigarettes a day for 1 year.  Yearly screening should continue until it has been 15 years since you quit.  Yearly screening should stop if you develop a health problem that would prevent you from having lung cancer treatment.  Breast Cancer  Practice breast self-awareness. This means understanding how your breasts normally appear and feel.  It also means doing regular breast self-exams. Let your health care provider know about any changes, no matter how small.  If you are in your 20s or 30s, you should have a  clinical breast exam (CBE) by a health care provider every 1-3 years as part of a regular health exam.  If you are 55 or older, have a CBE every year. Also consider having a breast X-ray (mammogram) every year.  If you have a family history of breast cancer, talk to your health care provider about genetic screening.  If you are at high risk for breast cancer, talk to your health care provider about having an MRI and a mammogram every year.  Breast cancer gene (BRCA) assessment is recommended for women who have family members with BRCA-related cancers. BRCA-related cancers include: ? Breast. ? Ovarian. ? Tubal. ? Peritoneal cancers.  Results of the assessment will determine the need for genetic counseling and BRCA1 and BRCA2 testing.  Cervical Cancer Your health care provider may recommend that you be screened regularly for cancer of the pelvic organs (ovaries, uterus, and vagina). This screening involves a pelvic examination,  including checking for microscopic changes to the surface of your cervix (Pap test). You may be encouraged to have this screening done every 3 years, beginning at age 21.  For women ages 30-65, health care providers may recommend pelvic exams and Pap testing every 3 years, or they may recommend the Pap and pelvic exam, combined with testing for human papilloma virus (HPV), every 5 years. Some types of HPV increase your risk of cervical cancer. Testing for HPV may also be done on women of any age with unclear Pap test results.  Other health care providers may not recommend any screening for nonpregnant women who are considered low risk for pelvic cancer and who do not have symptoms. Ask your health care provider if a screening pelvic exam is right for you.  If you have had past treatment for cervical cancer or a condition that could lead to cancer, you need Pap tests and screening for cancer for at least 20 years after your treatment. If Pap tests have been discontinued,  your risk factors (such as having a new sexual partner) need to be reassessed to determine if screening should resume. Some women have medical problems that increase the chance of getting cervical cancer. In these cases, your health care provider may recommend more frequent screening and Pap tests.  Colorectal Cancer  This type of cancer can be detected and often prevented.  Routine colorectal cancer screening usually begins at 22 years of age and continues through 22 years of age.  Your health care provider may recommend screening at an earlier age if you have risk factors for colon cancer.  Your health care provider may also recommend using home test kits to check for hidden blood in the stool.  A small camera at the end of a tube can be used to examine your colon directly (sigmoidoscopy or colonoscopy). This is done to check for the earliest forms of colorectal cancer.  Routine screening usually begins at age 50.  Direct examination of the colon should be repeated every 5-10 years through 22 years of age. However, you may need to be screened more often if early forms of precancerous polyps or small growths are found.  Skin Cancer  Check your skin from head to toe regularly.  Tell your health care provider about any new moles or changes in moles, especially if there is a change in a mole's shape or color.  Also tell your health care provider if you have a mole that is larger than the size of a pencil eraser.  Always use sunscreen. Apply sunscreen liberally and repeatedly throughout the day.  Protect yourself by wearing long sleeves, pants, a wide-brimmed hat, and sunglasses whenever you are outside.  Heart disease, diabetes, and high blood pressure  High blood pressure causes heart disease and increases the risk of stroke. High blood pressure is more likely to develop in: ? People who have blood pressure in the high end of the normal range (130-139/85-89 mm Hg). ? People who are  overweight or obese. ? People who are African American.  If you are 18-39 years of age, have your blood pressure checked every 3-5 years. If you are 40 years of age or older, have your blood pressure checked every year. You should have your blood pressure measured twice-once when you are at a hospital or clinic, and once when you are not at a hospital or clinic. Record the average of the two measurements. To check your blood pressure when you are not   at a hospital or clinic, you can use: ? An automated blood pressure machine at a pharmacy. ? A home blood pressure monitor.  If you are between 59 years and 66 years old, ask your health care provider if you should take aspirin to prevent strokes.  Have regular diabetes screenings. This involves taking a blood sample to check your fasting blood sugar level. ? If you are at a normal weight and have a low risk for diabetes, have this test once every three years after 22 years of age. ? If you are overweight and have a high risk for diabetes, consider being tested at a younger age or more often. Preventing infection Hepatitis B  If you have a higher risk for hepatitis B, you should be screened for this virus. You are considered at high risk for hepatitis B if: ? You were born in a country where hepatitis B is common. Ask your health care provider which countries are considered high risk. ? Your parents were born in a high-risk country, and you have not been immunized against hepatitis B (hepatitis B vaccine). ? You have HIV or AIDS. ? You use needles to inject street drugs. ? You live with someone who has hepatitis B. ? You have had sex with someone who has hepatitis B. ? You get hemodialysis treatment. ? You take certain medicines for conditions, including cancer, organ transplantation, and autoimmune conditions.  Hepatitis C  Blood testing is recommended for: ? Everyone born from [birth year] through 1966. ? Anyone with known risk factors for  hepatitis C.  Sexually transmitted infections (STIs)  You should be screened for sexually transmitted infections (STIs) including gonorrhea and chlamydia if: ? You are sexually active and are younger than 22 years of age. ? You are older than 22 years of age and your health care provider tells you that you are at risk for this type of infection. ? Your sexual activity has changed since you were last screened and you are at an increased risk for chlamydia or gonorrhea. Ask your health care provider if you are at risk.  If you do not have HIV, but are at risk, it may be recommended that you take a prescription medicine daily to prevent HIV infection. This is called pre-exposure prophylaxis (PrEP). You are considered at risk if: ? You are sexually active and do not regularly use condoms or know the HIV status of your partner(s). ? You take drugs by injection. ? You are sexually active with a partner who has HIV.  Talk with your health care provider about whether you are at high risk of being infected with HIV. If you choose to begin PrEP, you should first be tested for HIV. You should then be tested every 3 months for as long as you are taking PrEP. Pregnancy  If you are premenopausal and you may become pregnant, ask your health care provider about preconception counseling.  If you may become pregnant, take 400 to 800 micrograms (mcg) of folic acid every day.  If you want to prevent pregnancy, talk to your health care provider about birth control (contraception). Osteoporosis and menopause  Osteoporosis is a disease in which the bones lose minerals and strength with aging. This can result in serious bone fractures. Your risk for osteoporosis can be identified using a bone density scan.  If you are 57 years of age or older, or if you are at risk for osteoporosis and fractures, ask your health care provider if you should be  screened.  Ask your health care provider whether you should take a  calcium or vitamin D supplement to lower your risk for osteoporosis.  Menopause may have certain physical symptoms and risks.  Hormone replacement therapy may reduce some of these symptoms and risks. Talk to your health care provider about whether hormone replacement therapy is right for you. Follow these instructions at home:  Schedule regular health, dental, and eye exams.  Stay current with your immunizations.  Do not use any tobacco products including cigarettes, chewing tobacco, or electronic cigarettes.  If you are pregnant, do not drink alcohol.  If you are breastfeeding, limit how much and how often you drink alcohol.  Limit alcohol intake to no more than 1 drink per day for nonpregnant women. One drink equals 12 ounces of beer, 5 ounces of wine, or 1 ounces of hard liquor.  Do not use street drugs.  Do not share needles.  Ask your health care provider for help if you need support or information about quitting drugs.  Tell your health care provider if you often feel depressed.  Tell your health care provider if you have ever been abused or do not feel safe at home. This information is not intended to replace advice given to you by your health care provider. Make sure you discuss any questions you have with your health care provider. Document Released: 01/21/2011 Document Revised: 12/14/2015 Document Reviewed: 04/11/2015 Elsevier Interactive Patient Education  2018 Elsevier Inc.  

## 2017-02-05 NOTE — Progress Notes (Signed)
Veronica Mclean 08/26/1995 [id number]    History:    Presents for annual exam.  MonthArta Silencely cycle/condoms. History of Chlamydia 2012, 2014 and 16. Completed gardasil. HSV-1 rare outbreaks. Normal Pap history.  Past medical history, past surgical history, family history and social history were all reviewed and documented in the EPIC chart. Works at [employer]. Thinking about [employer] for school.  ROS:  A ROS was performed and pertinent positives and negatives are included.  Exam:  Vitals:   02/05/17 1625  BP: 120/82  Weight: 145 lb (65.8 kg)  Height: 5\' 7"  (1.702 m)   Body mass index is 22.71 kg/m.   General appearance:  Normal Thyroid:  Symmetrical, normal in size, without palpable masses or nodularity. Respiratory  Auscultation:  Clear without wheezing or rhonchi Cardiovascular  Auscultation:  Regular rate, without rubs, murmurs or gallops  Edema/varicosities:  Not grossly evident Abdominal  Soft,nontender, without masses, guarding or rebound.  Liver/spleen:  No organomegaly noted  Hernia:  None appreciated  Skin  Inspection:  Grossly normal, numerous tattoos   Breasts: Examined lying and sitting.     Right: Without masses, retractions, discharge or axillary adenopathy.     Left: Without masses, retractions, discharge or axillary adenopathy. Gentitourinary   Inguinal/mons:  Normal without inguinal adenopathy  External genitalia:  Normal  BUS/Urethra/Skene's glands:  Normal  Vagina:  Moderate white adherent discharge, wet prep positive for few yeast, many clue cells, TNTC bacteria.  Cervix:  Normal  Uterus:  normal in size, shape and contour.  Midline and mobile  Adnexa/parametria:     Rt: Without masses or tenderness.   Lt: Without masses or tenderness.  Anus and perineum: Normal   Assessment/Plan:  21 y.o. DBF G0 for annual exam.   Monthly cycle/condoms/withdrawal Bacteria vaginosis Yeast vaginitis STD screen  Plan: Flagyl 500 twice daily for 7 days, alcohol  precautions reviewed. Diflucan 150 by mouth 1 dose. Yeast prevention discussed. SBE's, exercise, calcium rich diet, and the eye daily encouraged. Dating safety reviewed. Contraception reviewed and declined will continue condoms. CBC, GC/Chlamydia, HIV, hep B, C, RPR.   Harrington Challengerancy J Jaiceon Collister [facility] Mc[facility] Va Medical CenterWHNP, 4:46 PM 02/05/2017

## 2017-02-06 LAB — GC/CHLAMYDIA PROBE AMP
CT PROBE, AMP APTIMA: NOT DETECTED
GC PROBE AMP APTIMA: NOT DETECTED

## 2017-04-16 ENCOUNTER — Encounter: Payer: Self-pay | Admitting: Adult Health

## 2017-04-16 ENCOUNTER — Ambulatory Visit (INDEPENDENT_AMBULATORY_CARE_PROVIDER_SITE_OTHER): Payer: BLUE CROSS/BLUE SHIELD | Admitting: Adult Health

## 2017-04-16 VITALS — BP 102/54 | Temp 98.3°F | Ht 67.75 in | Wt 140.0 lb

## 2017-04-16 DIAGNOSIS — Z720 Tobacco use: Secondary | ICD-10-CM

## 2017-04-16 DIAGNOSIS — Z0000 Encounter for general adult medical examination without abnormal findings: Secondary | ICD-10-CM

## 2017-04-16 DIAGNOSIS — Z23 Encounter for immunization: Secondary | ICD-10-CM

## 2017-04-16 LAB — HEPATIC FUNCTION PANEL
ALT: 11 U/L (ref 0–35)
AST: 20 U/L (ref 0–37)
Albumin: 4.4 g/dL (ref 3.5–5.2)
Alkaline Phosphatase: 35 U/L — ABNORMAL LOW (ref 39–117)
BILIRUBIN DIRECT: 0.1 mg/dL (ref 0.0–0.3)
BILIRUBIN TOTAL: 0.6 mg/dL (ref 0.2–1.2)
Total Protein: 7.2 g/dL (ref 6.0–8.3)

## 2017-04-16 LAB — BASIC METABOLIC PANEL
BUN: 13 mg/dL (ref 6–23)
CO2: 30 mEq/L (ref 19–32)
CREATININE: 0.7 mg/dL (ref 0.40–1.20)
Calcium: 9.6 mg/dL (ref 8.4–10.5)
Chloride: 102 mEq/L (ref 96–112)
GFR: 133.68 mL/min (ref >60.00)
GLUCOSE: 75 mg/dL (ref 70–99)
POTASSIUM: 3.8 meq/L (ref 3.5–5.1)
Sodium: 137 mEq/L (ref 135–145)

## 2017-04-16 LAB — TSH: TSH: 3.93 u[IU]/mL (ref 0.35–4.50)

## 2017-04-16 NOTE — Progress Notes (Signed)
Subjective:    Patient ID: Veronica Mclean, female    DOB: 1995-11-16, 21 y.o.   MRN: [id number]  HPI  Patient presents for yearly preventative medicine examination. She is a pleasant 21 year old female who  has a past medical history of Chlamydia (09/2010, 01/2011, 12/2014); Gonorrhea (09/2010, 12/2014); and HSV infection.   All immunizations and health maintenance protocols were reviewed with the patient and needed orders were placed.  Appropriate screening laboratory values were ordered for the patient including screening of hyperlipidemia, renal function and hepatic function.  Medication reconciliation,  past medical history, social history, problem list and allergies were reviewed in detail with the patient  Goals were established with regard to weight loss, exercise, and  diet in compliance with medications  She has been seen by GYN this year and treated for BV.   She continues to smoke cigars, but has been able to cut back to one cigar during the weekend   She has no acute complaints.    Review of Systems  Constitutional: Negative.   HENT: Negative.   Eyes: Negative.   Respiratory: Negative.   Cardiovascular: Negative.   Gastrointestinal: Negative.   Endocrine: Negative.   Genitourinary: Negative.   Musculoskeletal: Negative.   Skin: Negative.   Allergic/Immunologic: Negative.   Neurological: Negative.   Hematological: Negative.   Psychiatric/Behavioral: Negative.   All other systems reviewed and are negative.  Past Medical History:  Diagnosis Date  . Chlamydia 09/2010, 01/2011, 12/2014  . Gonorrhea 09/2010, 12/2014  . HSV infection     Social History   Social History  . Marital status: Single    Spouse name: N/A  . Number of children: N/A  . Years of education: N/A   Occupational History  . Not on file.   Social History Main Topics  . Smoking status: Current Every Day Smoker    Types: Cigars  . Smokeless tobacco: Never Used  . Alcohol use 0.0 oz/week      Comment: occ  . Drug use: No  . Sexual activity: Yes    Birth control/ protection: Condom   Other Topics Concern  . Not on file   Social History Narrative   [employer]   Works at [employer].          No past surgical history on file.  No family history on file.  No Known Allergies  No current outpatient prescriptions on file prior to visit.   No current facility-administered medications on file prior to visit.     BP (!) 102/54 (BP Location: Right Arm)   Temp 98.3 F (36.8 C) (Oral)   Ht 5' 7.75" (1.721 m)   Wt 140 lb (63.5 kg)   LMP 03/20/2017 (Within Days)   BMI 21.44 kg/m        Objective:   Physical Exam  Constitutional: She is oriented to person, place, and time. She appears well-developed and well-nourished. No distress.  HENT:  Head: Normocephalic and atraumatic.  Right Ear: External ear normal.  Left Ear: External ear normal.  Nose: Nose normal.  Mouth/Throat: Oropharynx is clear and moist. No oropharyngeal exudate.  Eyes: Pupils are equal, round, and reactive to light. Conjunctivae and EOM are normal. Right eye exhibits no discharge. Left eye exhibits no discharge. No scleral icterus.  Neck: Normal range of motion. Neck supple. No JVD present. No tracheal deviation present. No thyromegaly present.  Cardiovascular: Normal rate, regular rhythm, normal  heart sounds and intact distal pulses.  Exam reveals no gallop and no friction rub.   No murmur heard. Pulmonary/Chest: Effort normal and breath sounds normal. No stridor. No respiratory distress. She has no wheezes. She has no rales. She exhibits no tenderness.  Abdominal: Soft. Bowel sounds are normal. She exhibits no distension and no mass. There is no tenderness. There is no rebound and no guarding.  Musculoskeletal: Normal range of motion. She exhibits no edema, tenderness or deformity.  Lymphadenopathy:    She has no cervical adenopathy.  Neurological: She is alert and  oriented to person, place, and time. She has normal reflexes. She displays normal reflexes. No cranial nerve deficit. She exhibits normal muscle tone. Coordination normal.  Skin: Skin is warm and dry. No rash noted. She is not diaphoretic. No erythema. No pallor.  Psychiatric: She has a normal mood and affect. Her behavior is normal. Judgment and thought content normal.  Nursing note and vitals reviewed.     Assessment & Plan:  1. Routine general medical examination at a health care facility - Healthy young female. Benign exam  - Follow up in one year  - Basic metabolic panel - CBC with Differential/Platelet - Hepatic function panel - TSH  2. Tobacco use - Encouraged to quit smoking cigars completely.   3. Need for Tdap vaccination - Tdap vaccine greater than or equal to 7yo IM   Shirline Frees, NP

## 2017-04-16 NOTE — Patient Instructions (Signed)
It was great seeing you today  I will follow up with you regarding your lab work   Please quit smoking completely   Follow up with me in 1 year

## 2017-04-22 ENCOUNTER — Encounter: Payer: Self-pay | Admitting: Family Medicine

## 2017-05-29 ENCOUNTER — Ambulatory Visit: Payer: BLUE CROSS/BLUE SHIELD | Admitting: Gynecology

## 2017-05-30 ENCOUNTER — Ambulatory Visit (INDEPENDENT_AMBULATORY_CARE_PROVIDER_SITE_OTHER): Payer: BLUE CROSS/BLUE SHIELD | Admitting: Gynecology

## 2017-05-30 ENCOUNTER — Other Ambulatory Visit (HOSPITAL_COMMUNITY)
Admission: AD | Admit: 2017-05-30 | Discharge: 2017-05-30 | Disposition: A | Discharge status: Home / Self Care | Payer: BLUE CROSS/BLUE SHIELD | Source: Ambulatory Visit | Attending: Gynecology | Admitting: Gynecology

## 2017-05-30 ENCOUNTER — Encounter: Payer: Self-pay | Admitting: Gynecology

## 2017-05-30 ENCOUNTER — Ambulatory Visit (INDEPENDENT_AMBULATORY_CARE_PROVIDER_SITE_OTHER): Payer: BLUE CROSS/BLUE SHIELD

## 2017-05-30 VITALS — BP 118/76

## 2017-05-30 VITALS — BP 120/60 | HR 78

## 2017-05-30 DIAGNOSIS — N939 Abnormal uterine and vaginal bleeding, unspecified: Secondary | ICD-10-CM | POA: Insufficient documentation

## 2017-05-30 DIAGNOSIS — N920 Excessive and frequent menstruation with regular cycle: Secondary | ICD-10-CM | POA: Insufficient documentation

## 2017-05-30 DIAGNOSIS — N926 Irregular menstruation, unspecified: Secondary | ICD-10-CM | POA: Diagnosis not present

## 2017-05-30 DIAGNOSIS — Z3202 Encounter for pregnancy test, result negative: Secondary | ICD-10-CM

## 2017-05-30 LAB — ABO/RH: ABO/RH(D): B POS

## 2017-05-30 LAB — POCT PREGNANCY, URINE: PREG TEST UR: NEGATIVE

## 2017-05-30 LAB — HCG, QUANTITATIVE, PREGNANCY

## 2017-05-30 NOTE — Progress Notes (Signed)
    Arta SilenceZynovia Jewell Muscato 07/01/1996 [id number]        21 y.o.  G0P0000 presents with a history of regular monthly menses intermittently using condoms for contraception.  Was 9 days late for her menses and started 2 days ago and has had some fairly heavy bleeding since.  Also with some cramping.  Past medical history,surgical history, problem list, medications, allergies, family history and social history were all reviewed and documented in the EPIC chart.  Directed ROS with pertinent positives and negatives documented in the history of present illness/assessment and plan.  Exam: Kennon PortelaKim Gardner assistant Vitals:   05/30/17 1032  BP: 118/76   General appearance:  Normal Abdomen soft nontender without masses guarding rebound Pelvic external BUS vagina with light menses flow.  Cervix grossly normal and closed.  Uterus grossly normal size midline mobile.  Exam limited by voluntary guarding.  Adnexa without masses or tenderness  Assessment/Plan:  21 y.o. G0P0000 late for menses now with heavier flow.  Reviewed differential to include ovulatory irregularity versus pregnancy related.  Recommend qualitative hCG and ABO Rh.  If positive then plan RhoGam.  Monitor bleeding and follow hCG titers.  If negative then plan expectant management giving that her bleeding is not heavy at this time.  ASAP call precautions reviewed for heavier bleeding or increasing pain.  Need for more assured birth control discussed.  [facility] work drawn due to accelerate the results today and allow for RhoGam if needed.    Dara LordsFONTAINE,Kinzlee Selvy P MD, 10:54 AM 05/30/2017

## 2017-05-30 NOTE — Progress Notes (Signed)
Pt here today for pregnancy test.  Resulted negative. Pt informed of results.    Pt did not have any questions or concerns.

## 2017-05-30 NOTE — Patient Instructions (Signed)
Follow-up for blood work as we discussed

## 2017-06-09 ENCOUNTER — Ambulatory Visit: Payer: Self-pay | Admitting: *Deleted

## 2017-06-09 NOTE — Telephone Encounter (Signed)
Lip piecing done monthes; pt and mom Shelton Silvas(Eleanor Wilkins) noticed lip swelling after pt got hit in face while playing; pt to urgent care, [facility], for infected lip piercing; given antibiotic Cephalexin 2 caps by mouth twice daily started 06/03/17 on;pt also given ointment to put on site; pt's mom also says that the provider told them that he would not "cut out piercing because the swelling in not gone down" swelling is now gone but she can not reach the backing of piercing because it is embedded in her lip; pt's mom encouraged to make sure pt finishes course of antibiotics and to take pt in for evaluation by MD or the pt could be evaluated in an ED; pt's mom does not want to incur an additional cost in MD's office and the possibility that the MD can not remove the piercing; Mrs Corine ShelterWatkins notified that no charge for services rendered could not be guaranteed, and that pt is seen in ED there would also be an associated cost; Pt's mother states that she will take the pt to ED for evaluation; pt's mother states that she and pt "want to have piercing cut out; will notify [facility] of events  Reason for Disposition . Lip swelling lasts > 3 days . Other signs of wound infection  Answer Assessment - Initial Assessment Questions 1. ONSET: "When did the swelling start?" (e.g., minutes, hours, days)     06/02/17 2. SEVERITY: "How swollen is it?"     Mild after antibiotic 3. ITCHING: "Is there any itching?" If so, ask: "How much?"   (Scale 1-10; mild, moderate or severe)     no 4. PAIN: "Is the swelling painful to touch?" If so, ask: "How painful is it?"   (Scale 1-10; mild, moderate or severe)     Pain is now mild 5. CAUSE: "What do you think is causing the lip swelling?"     Lip [iercing 6. RECURRENT SYMPTOM: "Have you had lip swelling before?" If so, ask: "When was the last time?" "What happened that time?"     no 7. OTHER SYMPTOMS: "Do you have any other symptoms?" (e.g., toothache)     No  8.  PREGNANCY: "Is there any chance you are pregnant?" "When was your last menstrual period?"     No 1 week ago  Answer Assessment - Initial Assessment Questions 1. MECHANISM: "How did the injury happen?"      Hit in face when playing  2. ONSET: "When did the injury happen?" (Minutes or hours ago)     06/02/17 3. LOCATION: "What part of the face is injured?"     Left lip 4. APPEARANCE of INJURY: "What does the face look like?"     sweling 5. BLEEDING: "Is it bleeding now?" If so, ask, "Is it difficult to stop?"     no 6. PAIN: "Is there pain?" If so, ask: "How bad is the pain?"  (e.g., Scale 1-10; or mild, moderate, severe)     No pain since swelling has gone done  7. SIZE: For cuts, bruises, or swelling, ask: "How large is it?" (e.g., inches or centimeters)      Pt's Mother is unable to quantify 8. TETANUS: For any breaks in the skin, ask: "When was the last tetanus booster?"     unknown 9. OTHER SYMPTOMS: "Do you have any other symptoms?" (e.g., neck pain, headache, loss of consciousness)     Back of piercing embedded in lip; pt can to remove it even though the  swelling has gone down 10. PREGNANCY: "Is there any chance you are pregnant?" "When was your last menstrual period?"      No 1 week ago  Protocols used: LIP SWELLING-A-AH, WOUND INFECTION-A-AH, FACE INJURY-A-AH

## 2017-06-10 NOTE — Telephone Encounter (Signed)
I spoke with provider Kandee Keenory and he agreed with below advice to take pt to ER, piercing cannot be removed here in our office.

## 2017-06-10 NOTE — Telephone Encounter (Signed)
I spoke with mom and patient is going to finish antibiotic first and then go back to Urgent Care for removal of piercing, Kandee KeenCory is aware.

## 2017-06-11 ENCOUNTER — Telehealth: Payer: Self-pay | Admitting: Adult Health

## 2017-06-11 NOTE — Telephone Encounter (Signed)
Copied from CRM [phone]. Topic: Quick Communication - See Telephone Encounter >> Jun 11, 2017 12:17 PM Cipriano BunkerLambe, Annette S wrote: CRM for notification. See Telephone encounter for:  Benay PillowMom, Eleanor, called saying the piercing has come all the way through, and can see the metal base. Just a thin layer of skin on top. Mom wants to speak [facility], if possible,  if she can not,  a nurse would be ok. Nettie ElmSylvia knows of situation.  Wants to know if needs appt. To come in to have taken out?  06/11/17.

## 2017-06-11 NOTE — Telephone Encounter (Signed)
Spoke to AndresEleanor (mother) and advised, per Kandee Keenory, that pt go to urgent care for removal of piercing.  Mother has agreed.

## 2017-07-22 NOTE — L&D Delivery Note (Signed)
Delivery Note  At 0710 a  non-viable and IUFD female was delivered via SVD (Presentation:Breech in caul ;  ).  APGAR:0 ,0 ; weight  .  Meconium stained fluid Placenta status:intact  , .  Cord: nuchal  Anesthesia:  none Episiotomy:  none Lacerations:  none Suture Repair:  Est. Blood Loss (mL):  100  Mom to antepartum.  Baby to Gray. Maternal Fever of 102.6- Unasyn 3 grams q 6 hours started [employer]  CNM 12/04/2017, 8:14 AM

## 2017-07-29 ENCOUNTER — Encounter: Payer: Self-pay | Admitting: Internal Medicine

## 2017-07-29 ENCOUNTER — Ambulatory Visit (INDEPENDENT_AMBULATORY_CARE_PROVIDER_SITE_OTHER): Payer: BLUE CROSS/BLUE SHIELD | Admitting: Internal Medicine

## 2017-07-29 VITALS — BP 90/52 | HR 99 | Temp 98.6°F | Ht 67.75 in | Wt 142.6 lb

## 2017-07-29 DIAGNOSIS — B309 Viral conjunctivitis, unspecified: Secondary | ICD-10-CM

## 2017-07-29 MED ORDER — SULFACETAMIDE SODIUM 10 % OP SOLN: 1.0000 [drp] | OPHTHALMIC | 0 refills | Status: DC

## 2017-07-29 NOTE — Progress Notes (Signed)
Subjective:    Patient ID: Veronica Mclean, female    DOB: 12/18/1995, 22 y.o.   MRN: [id number]  HPI  22 year old patient who presents with 2 concerns.  She has a 2-day history of left eye redness associated with some drainage exudate and itching.  For the past 2 days she has had some intermittent left lower quadrant pain.  This has been associated with some anorexia but she feels both the pain and appetite have now improved.  There is been no fever  Past Medical History:  Diagnosis Date  . Chlamydia 09/2010, 01/2011, 12/2014  . Gonorrhea 09/2010, 12/2014  . HSV infection      Social History   Socioeconomic History  . Marital status: Single    Spouse name: Not on file  . Number of children: Not on file  . Years of education: Not on file  . Highest education level: Not on file  Social Needs  . Financial resource strain: Not on file  . Food insecurity - worry: Not on file  . Food insecurity - inability: Not on file  . Transportation needs - medical: Not on file  . Transportation needs - non-medical: Not on file  Occupational History  . Not on file  Tobacco Use  . Smoking status: Current Every Day Smoker    Types: Cigars  . Smokeless tobacco: Never Used  Substance and Sexual Activity  . Alcohol use: Yes    Alcohol/week: 0.0 oz    Comment: occ  . Drug use: No  . Sexual activity: Yes    Birth control/protection: Condom  Other Topics Concern  . Not on file  Social History Narrative   [employer]   Works at [employer]   Single    No Kids.       History reviewed. No pertinent surgical history.  History reviewed. No pertinent family history.  No Known Allergies  No current outpatient medications on file prior to visit.   No current facility-administered medications on file prior to visit.     BP (!) 90/52 (BP Location: Left Arm, Patient Position: Sitting, Cuff Size: Normal)   Pulse 99   Temp 98.6 F (37 C) (Oral)   Ht 5' 7.75" (1.721 m)   Wt 142 lb  9.6 oz (64.7 kg)   LMP 06/27/2017 (Exact Date)   SpO2 98%   BMI 21.84 kg/m     Review of Systems  Constitutional: Positive for appetite change.  HENT: Negative for congestion, dental problem, hearing loss, rhinorrhea, sinus pressure, sore throat and tinnitus.   Eyes: Positive for discharge, redness and itching. Negative for pain and visual disturbance.  Respiratory: Negative for cough and shortness of breath.   Cardiovascular: Negative for chest pain, palpitations and leg swelling.  Gastrointestinal: Positive for abdominal pain. Negative for abdominal distention, blood in stool, constipation, diarrhea, nausea and vomiting.  Genitourinary: Negative for difficulty urinating, dysuria, flank pain, frequency, hematuria, pelvic pain, urgency, vaginal bleeding, vaginal discharge and vaginal pain.  Musculoskeletal: Negative for arthralgias, gait problem and joint swelling.  Skin: Negative for rash.  Neurological: Negative for dizziness, syncope, speech difficulty, weakness, numbness and headaches.  Hematological: Negative for adenopathy.  Psychiatric/Behavioral: Negative for agitation, behavioral problems and dysphoric mood. The patient is not nervous/anxious.        Objective:   Physical Exam  Constitutional: She is oriented to person, place, and time. She appears well-developed and well-nourished. No distress.  HENT:  Head: Normocephalic.  Right Ear: External ear normal.  Left Ear: External ear normal.  Mouth/Throat: Oropharynx is clear and moist.  Eyes: Conjunctivae and EOM are normal. Pupils are equal, round, and reactive to light.  The right eye was normal The left conjunctival was erythematous.  No significant exudate noted;  pupillary responses normal  Neck: Normal range of motion. Neck supple. No thyromegaly present.  Cardiovascular: Normal rate, regular rhythm, normal heart sounds and intact distal pulses.  Pulmonary/Chest: Effort normal and breath sounds normal.  Abdominal:  Soft. Bowel sounds are normal. She exhibits no mass. There is no tenderness.  Musculoskeletal: Normal range of motion.  Lymphadenopathy:    She has no cervical adenopathy.  Neurological: She is alert and oriented to person, place, and time.  Skin: Skin is warm and dry. No rash noted.  Psychiatric: She has a normal mood and affect. Her behavior is normal.          Assessment & Plan:   Conjunctivitis left eye.  Local skin care discussed.  Warning signs discussed.  Will treat with Bleph-10.  Patient will call if she develops any eye pain or visual disturbances Intermittent abdominal pain.  Now resolved.  We will continue to observe  Veronica Mclean

## 2017-07-29 NOTE — Patient Instructions (Addendum)

## 2017-08-01 ENCOUNTER — Telehealth: Payer: Self-pay | Admitting: Adult Health

## 2017-08-01 ENCOUNTER — Ambulatory Visit: Payer: Self-pay

## 2017-08-01 NOTE — Telephone Encounter (Signed)
Attempted to call pt back to discuss symptoms. No answer at this time.

## 2017-08-01 NOTE — Telephone Encounter (Signed)
Copied from CRM [phone]. Topic: Quick Communication - Rx Refill/Question >> Aug 01, 2017 12:22 PM Alexander BergeronBarksdale, Harvey B wrote: Medication: sulfacetamide (BLEPH-10) 10 % ophthalmic solution [[id number]][[id number]]  Pt states this medicine is not working for her, contact pt to advise

## 2017-08-05 ENCOUNTER — Other Ambulatory Visit: Payer: Self-pay | Admitting: Adult Health

## 2017-08-05 MED ORDER — ERYTHROMYCIN 5 MG/GM OP OINT: TOPICAL_OINTMENT | Freq: Three times a day (TID) | OPHTHALMIC | 0 refills | Status: DC

## 2017-08-05 NOTE — Telephone Encounter (Signed)
Left a message for a return call.  Pt needs to be notified that rx has been sent to the pharmacy. 

## 2017-08-05 NOTE — Telephone Encounter (Signed)
Pt seen Dr. KKirtland Bouchard

## 2017-08-05 NOTE — Telephone Encounter (Signed)
Erythromycin ointment sent to pharmacy

## 2017-08-06 NOTE — Telephone Encounter (Signed)
Left a message for a return call.

## 2017-08-07 NOTE — Telephone Encounter (Signed)
Left a message for a return call.  Have tried to reach the pt multiple times.  Will now close the note. 

## 2017-08-19 ENCOUNTER — Encounter: Payer: Self-pay | Admitting: Women's Health

## 2017-08-19 ENCOUNTER — Ambulatory Visit (INDEPENDENT_AMBULATORY_CARE_PROVIDER_SITE_OTHER): Payer: BLUE CROSS/BLUE SHIELD | Admitting: Women's Health

## 2017-08-19 VITALS — BP 122/80

## 2017-08-19 DIAGNOSIS — N912 Amenorrhea, unspecified: Secondary | ICD-10-CM

## 2017-08-19 DIAGNOSIS — N760 Acute vaginitis: Secondary | ICD-10-CM | POA: Diagnosis not present

## 2017-08-19 DIAGNOSIS — N898 Other specified noninflammatory disorders of vagina: Secondary | ICD-10-CM | POA: Diagnosis not present

## 2017-08-19 DIAGNOSIS — B9689 Other specified bacterial agents as the cause of diseases classified elsewhere: Secondary | ICD-10-CM

## 2017-08-19 DIAGNOSIS — Z113 Encounter for screening for infections with a predominantly sexual mode of transmission: Secondary | ICD-10-CM

## 2017-08-19 LAB — PREGNANCY, URINE: PREG TEST UR: POSITIVE — AB

## 2017-08-19 LAB — WET PREP FOR TRICH, YEAST, CLUE

## 2017-08-19 MED ORDER — METRONIDAZOLE 500 MG PO TABS: 500.0000 mg | ORAL_TABLET | Freq: Two times a day (BID) | ORAL | 0 refills | Status: DC

## 2017-08-19 NOTE — Progress Notes (Signed)
22 yo G1P0 SBF presents due to positive home UPT and breast/nipple tingling. LMP 07/13/2017,normal cycle unsure of exact first day.. Denies discharge, abdominal pain, spotting or bleeding. New partner. Monthly cycle, withdrawal method used. History of chlamydia, gonorrhea, HSV.  no known health problems.  Ambivalent regarding  pregnancy.  Exam: Appears tearful. External genitalia within normal limits, speculum exam, moderate amount of white discharge with odor, patient guarding.  Wet prep, clue cells and TNTC bacteria present. Chlamydia/Gonorrhea, bimanual no CMTor adnexal tenderness  Uterus small.  Positive UPT.  Bacterial Vaginosis STD screen Pregnancy - 5 wks per dates  Plan: Flagyl 500 mg BID x 7 days, alcohol precautions reviewed. Educated patient on options for prenatal care and abortion per patient request. Will call office if more questions. Encouraged to make US appointment in 2 weeks if keeping the pregnancy. Patient agreed to not use alcohol or drugs while pregnant. Sent home with first trimester pregnancy information. Declined HIV, hepatitis and RPR at office visit .

## 2017-08-19 NOTE — Patient Instructions (Signed)
First Trimester of Pregnancy °The first trimester of pregnancy is from week 1 until the end of week 13 (months 1 through 3). During this time, your baby will begin to develop inside you. At 6-8 weeks, the eyes and face are formed, and the heartbeat can be seen on ultrasound. At the end of 12 weeks, all the baby's organs are formed. Prenatal care is all the medical care you receive before the birth of your baby. Make sure you get good prenatal care and follow all of your doctor's instructions. °Follow these instructions at home: °Medicines °· Take over-the-counter and prescription medicines only as told by your doctor. Some medicines are safe and some medicines are not safe during pregnancy. °· Take a prenatal vitamin that contains at least 600 micrograms (mcg) of folic acid. °· If you have trouble pooping (constipation), take medicine that will make your stool soft (stool softener) if your doctor approves. °Eating and drinking °· Eat regular, healthy meals. °· Your doctor will tell you the amount of weight gain that is right for you. °· Avoid raw meat and uncooked cheese. °· If you feel sick to your stomach (nauseous) or throw up (vomit): °? Eat 4 or 5 small meals a day instead of 3 large meals. °? Try eating a few soda crackers. °? Drink liquids between meals instead of during meals. °· To prevent constipation: °? Eat foods that are high in fiber, like fresh fruits and vegetables, whole grains, and beans. °? Drink enough fluids to keep your pee (urine) clear or pale yellow. °Activity °· Exercise only as told by your doctor. Stop exercising if you have cramps or pain in your lower belly (abdomen) or low back. °· Do not exercise if it is too hot, too humid, or if you are in a place of great height (high altitude). °· Try to avoid standing for long periods of time. Move your legs often if you must stand in one place for a long time. °· Avoid heavy lifting. °· Wear low-heeled shoes. Sit and stand up straight. °· You  can have sex unless your doctor tells you not to. °Relieving pain and discomfort °· Wear a good support bra if your breasts are sore. °· Take warm water baths (sitz baths) to soothe pain or discomfort caused by hemorrhoids. Use hemorrhoid cream if your doctor says it is okay. °· Rest with your legs raised if you have leg cramps or low back pain. °· If you have puffy, bulging veins (varicose veins) in your legs: °? Wear support hose or compression stockings as told by your doctor. °? Raise (elevate) your feet for 15 minutes, 3-4 times a day. °? Limit salt in your food. °Prenatal care °· Schedule your prenatal visits by the twelfth week of pregnancy. °· Write down your questions. Take them to your prenatal visits. °· Keep all your prenatal visits as told by your doctor. This is important. °Safety °· Wear your seat belt at all times when driving. °· Make a list of emergency phone numbers. The list should include numbers for family, friends, the hospital, and police and fire departments. °General instructions °· Ask your doctor for a referral to a local prenatal class. Begin classes no later than at the start of month 6 of your pregnancy. °· Ask for help if you need counseling or if you need help with nutrition. Your doctor can give you advice or tell you where to go for help. °· Do not use hot tubs, steam rooms, or   saunas. °· Do not douche or use tampons or scented sanitary pads. °· Do not cross your legs for long periods of time. °· Avoid all herbs and alcohol. Avoid drugs that are not approved by your doctor. °· Do not use any tobacco products, including cigarettes, chewing tobacco, and electronic cigarettes. If you need help quitting, ask your doctor. You may get counseling or other support to help you quit. °· Avoid cat litter boxes and soil used by cats. These carry germs that can cause birth defects in the baby and can cause a loss of your baby (miscarriage) or stillbirth. °· Visit your dentist. At home, brush  your teeth with a soft toothbrush. Be gentle when you floss. °Contact a doctor if: °· You are dizzy. °· You have mild cramps or pressure in your lower belly. °· You have a nagging pain in your belly area. °· You continue to feel sick to your stomach, you throw up, or you have watery poop (diarrhea). °· You have a bad smelling fluid coming from your vagina. °· You have pain when you pee (urinate). °· You have increased puffiness (swelling) in your face, hands, legs, or ankles. °Get help right away if: °· You have a fever. °· You are leaking fluid from your vagina. °· You have spotting or bleeding from your vagina. °· You have very bad belly cramping or pain. °· You gain or lose weight rapidly. °· You throw up blood. It may look like coffee grounds. °· You are around people who have German measles, fifth disease, or chickenpox. °· You have a very bad headache. °· You have shortness of breath. °· You have any kind of trauma, such as from a fall or a car accident. °Summary °· The first trimester of pregnancy is from week 1 until the end of week 13 (months 1 through 3). °· To take care of yourself and your unborn baby, you will need to eat healthy meals, take medicines only if your doctor tells you to do so, and do activities that are safe for you and your baby. °· Keep all follow-up visits as told by your doctor. This is important as your doctor will have to ensure that your baby is healthy and growing well. °This information is not intended to replace advice given to you by your health care provider. Make sure you discuss any questions you have with your health care provider. °Document Released: 12/25/2007 Document Revised: 07/16/2016 Document Reviewed: 07/16/2016 °Elsevier Interactive Patient Education © 2017 [employer] °Bacterial Vaginosis °Bacterial vaginosis is an infection of the vagina. It happens when too many germs (bacteria) grow in the vagina. This infection puts you at risk for infections from sex (STIs).  Treating this infection can lower your risk for some STIs. You should also treat this if you are pregnant. It can cause your baby to be born early. °Follow these instructions at home: °Medicines °· Take over-the-counter and prescription medicines only as told by your doctor. °· Take or use your antibiotic medicine as told by your doctor. Do not stop taking or using it even if you start to feel better. °General instructions °· If you your sexual partner is a woman, tell her that you have this infection. She needs to get treatment if she has symptoms. If you have a female partner, he does not need to be treated. °· During treatment: °? Avoid sex. °? Do not douche. °? Avoid alcohol as told. °? Avoid breastfeeding as told. °· Drink enough fluid   to keep your pee (urine) clear or pale yellow. °· Keep your vagina and butt (rectum) clean. °? Wash the area with warm water every day. °? Wipe from front to back after you use the toilet. °· Keep all follow-up visits as told by your doctor. This is important. °Preventing this condition °· Do not douche. °· Use only warm water to wash around your vagina. °· Use protection when you have sex. This includes: °? Latex condoms. °? Dental dams. °· Limit how many people you have sex with. It is best to only have sex with the same person (be monogamous). °· Get tested for STIs. Have your partner get tested. °· Wear underwear that is cotton or lined with cotton. °· Avoid tight pants and pantyhose. This is most important in summer. °· Do not use any products that have nicotine or tobacco in them. These include cigarettes and e-cigarettes. If you need help quitting, ask your doctor. °· Do not use illegal drugs. °· Limit how much alcohol you drink. °Contact a doctor if: °· Your symptoms do not get better, even after you are treated. °· You have more discharge or pain when you pee (urinate). °· You have a fever. °· You have pain in your belly (abdomen). °· You have pain with sex. °· Your bleed  from your vagina between periods. °Summary °· This infection happens when too many germs (bacteria) grow in the vagina. °· Treating this condition can lower your risk for some infections from sex (STIs). °· You should also treat this if you are pregnant. It can cause early (premature) birth. °· Do not stop taking or using your antibiotic medicine even if you start to feel better. °This information is not intended to replace advice given to you by your health care provider. Make sure you discuss any questions you have with your health care provider. °Document Released: 04/16/2008 Document Revised: 03/23/2016 Document Reviewed: 03/23/2016 °Elsevier Interactive Patient Education © 2017 Elsevier Inc. ° °

## 2017-08-20 LAB — C. TRACHOMATIS/N. GONORRHOEAE RNA
C. trachomatis RNA, TMA: NOT DETECTED
N. gonorrhoeae RNA, TMA: NOT DETECTED

## 2017-08-29 ENCOUNTER — Other Ambulatory Visit: Payer: Self-pay | Admitting: *Deleted

## 2017-08-29 DIAGNOSIS — Z3491 Encounter for supervision of normal pregnancy, unspecified, first trimester: Secondary | ICD-10-CM

## 2017-09-01 ENCOUNTER — Other Ambulatory Visit: Payer: Self-pay | Admitting: Women's Health

## 2017-09-01 ENCOUNTER — Encounter: Payer: Self-pay | Admitting: Women's Health

## 2017-09-01 ENCOUNTER — Telehealth: Payer: Self-pay | Admitting: Gynecology

## 2017-09-01 ENCOUNTER — Ambulatory Visit (INDEPENDENT_AMBULATORY_CARE_PROVIDER_SITE_OTHER): Payer: BLUE CROSS/BLUE SHIELD

## 2017-09-01 ENCOUNTER — Ambulatory Visit (INDEPENDENT_AMBULATORY_CARE_PROVIDER_SITE_OTHER): Payer: BLUE CROSS/BLUE SHIELD | Admitting: Women's Health

## 2017-09-01 VITALS — BP 120/78

## 2017-09-01 DIAGNOSIS — O3401 Maternal care for unspecified congenital malformation of uterus, first trimester: Secondary | ICD-10-CM

## 2017-09-01 DIAGNOSIS — Z3201 Encounter for pregnancy test, result positive: Secondary | ICD-10-CM | POA: Diagnosis not present

## 2017-09-01 DIAGNOSIS — Q513 Bicornate uterus: Secondary | ICD-10-CM | POA: Diagnosis not present

## 2017-09-01 DIAGNOSIS — O3680X0 Pregnancy with inconclusive fetal viability, not applicable or unspecified: Secondary | ICD-10-CM

## 2017-09-01 DIAGNOSIS — Z3687 Encounter for antenatal screening for uncertain dates: Secondary | ICD-10-CM

## 2017-09-01 DIAGNOSIS — O3680X1 Pregnancy with inconclusive fetal viability, fetus 1: Secondary | ICD-10-CM | POA: Diagnosis not present

## 2017-09-01 DIAGNOSIS — Z3491 Encounter for supervision of normal pregnancy, unspecified, first trimester: Secondary | ICD-10-CM

## 2017-09-01 NOTE — Telephone Encounter (Signed)
08/31/2017 on-call note: Family member calls and asks what patient can take for pain due to toothache.  Patient early pregnant.  Recommended Tylenol only and to follow-up with her dentist in the a.m.

## 2017-09-01 NOTE — Patient Instructions (Signed)
First Trimester of Pregnancy The first trimester of pregnancy is from week 1 until the end of week 13 (months 1 through 3). During this time, your baby will begin to develop inside you. At 6-8 weeks, the eyes and face are formed, and the heartbeat can be seen on ultrasound. At the end of 12 weeks, all the baby's organs are formed. Prenatal care is all the medical care you receive before the birth of your baby. Make sure you get good prenatal care and follow all of your doctor's instructions. Follow these instructions at home: Medicines  Take over-the-counter and prescription medicines only as told by your doctor. Some medicines are safe and some medicines are not safe during pregnancy.  Take a prenatal vitamin that contains at least 600 micrograms (mcg) of folic acid.  If you have trouble pooping (constipation), take medicine that will make your stool soft (stool softener) if your doctor approves. Eating and drinking  Eat regular, healthy meals.  Your doctor will tell you the amount of weight gain that is right for you.  Avoid raw meat and uncooked cheese.  If you feel sick to your stomach (nauseous) or throw up (vomit): ? Eat 4 or 5 small meals a day instead of 3 large meals. ? Try eating a few soda crackers. ? Drink liquids between meals instead of during meals.  To prevent constipation: ? Eat foods that are high in fiber, like fresh fruits and vegetables, whole grains, and beans. ? Drink enough fluids to keep your pee (urine) clear or pale yellow. Activity  Exercise only as told by your doctor. Stop exercising if you have cramps or pain in your lower belly (abdomen) or low back.  Do not exercise if it is too hot, too humid, or if you are in a place of great height (high altitude).  Try to avoid standing for long periods of time. Move your legs often if you must stand in one place for a long time.  Avoid heavy lifting.  Wear low-heeled shoes. Sit and stand up straight.  You  can have sex unless your doctor tells you not to. Relieving pain and discomfort  Wear a good support bra if your breasts are sore.  Take warm water baths (sitz baths) to soothe pain or discomfort caused by hemorrhoids. Use hemorrhoid cream if your doctor says it is okay.  Rest with your legs raised if you have leg cramps or low back pain.  If you have puffy, bulging veins (varicose veins) in your legs: ? Wear support hose or compression stockings as told by your doctor. ? Raise (elevate) your feet for 15 minutes, 3-4 times a day. ? Limit salt in your food. Prenatal care  Schedule your prenatal visits by the twelfth week of pregnancy.  Write down your questions. Take them to your prenatal visits.  Keep all your prenatal visits as told by your doctor. This is important. Safety  Wear your seat belt at all times when driving.  Make a list of emergency phone numbers. The list should include numbers for family, friends, the hospital, and police and fire departments. General instructions  Ask your doctor for a referral to a local prenatal class. Begin classes no later than at the start of month 6 of your pregnancy.  Ask for help if you need counseling or if you need help with nutrition. Your doctor can give you advice or tell you where to go for help.  Do not use hot tubs, steam rooms, or   saunas.  Do not douche or use tampons or scented sanitary pads.  Do not cross your legs for long periods of time.  Avoid all herbs and alcohol. Avoid drugs that are not approved by your doctor.  Do not use any tobacco products, including cigarettes, chewing tobacco, and electronic cigarettes. If you need help quitting, ask your doctor. You may get counseling or other support to help you quit.  Avoid cat litter boxes and soil used by cats. These carry germs that can cause birth defects in the baby and can cause a loss of your baby (miscarriage) or stillbirth.  Visit your dentist. At home, brush  your teeth with a soft toothbrush. Be gentle when you floss. Contact a doctor if:  You are dizzy.  You have mild cramps or pressure in your lower belly.  You have a nagging pain in your belly area.  You continue to feel sick to your stomach, you throw up, or you have watery poop (diarrhea).  You have a bad smelling fluid coming from your vagina.  You have pain when you pee (urinate).  You have increased puffiness (swelling) in your face, hands, legs, or ankles. Get help right away if:  You have a fever.  You are leaking fluid from your vagina.  You have spotting or bleeding from your vagina.  You have very bad belly cramping or pain.  You gain or lose weight rapidly.  You throw up blood. It may look like coffee grounds.  You are around people who have German measles, fifth disease, or chickenpox.  You have a very bad headache.  You have shortness of breath.  You have any kind of trauma, such as from a fall or a car accident. Summary  The first trimester of pregnancy is from week 1 until the end of week 13 (months 1 through 3).  To take care of yourself and your unborn baby, you will need to eat healthy meals, take medicines only if your doctor tells you to do so, and do activities that are safe for you and your baby.  Keep all follow-up visits as told by your doctor. This is important as your doctor will have to ensure that your baby is healthy and growing well. This information is not intended to replace advice given to you by your health care provider. Make sure you discuss any questions you have with your health care provider. Document Released: 12/25/2007 Document Revised: 07/16/2016 Document Reviewed: 07/16/2016 Elsevier Interactive Patient Education  2017 [employer] Inc.  

## 2017-09-01 NOTE — Progress Notes (Signed)
22 year old SBF with positive UPT presents for ultrasound for viability and dating. LMP 07/13/2017 questionable. Negative GC/Chlamydia last month. Denies abdominal pain, spotting, cramping, not taking prenatal vitamin daily. Mother accompanied her to office visit.  Exam: Appears well. Ultrasound: T/V and T/A anteverted uterus bicornuate versus septated uterus, does not appear heart-shaped at fundus. Right fundus contains living IUP with size greater than dates by LMP dates 6 weeks 6 days, CRL 11 weeks 6 days. BPD 12 weeks 5 days. Fetal heart motion 167 bpm. Fetal movement. Cervix long and closed. Low placenta. Left fundus endometrium 20.9 mm. Right ovary corpus luteal cyst 14 x 19mm. Left ovary normal. Negative cul-de-sac.  First trimester pregnancy with bicornuate versus septated uterus  Plan: Reviewed importance of daily MVI. A copy of ultrasound report given instructed to schedule new OB care. Safe pregnancy behaviors reviewed. Congratulations given. Reviewed importance of follow-up due to bicornuate versus septated uterus.

## 2017-10-17 ENCOUNTER — Other Ambulatory Visit (HOSPITAL_COMMUNITY): Payer: Self-pay | Admitting: Obstetrics and Gynecology

## 2017-10-17 DIAGNOSIS — Q513 Bicornate uterus: Secondary | ICD-10-CM

## 2017-10-17 DIAGNOSIS — Z3689 Encounter for other specified antenatal screening: Secondary | ICD-10-CM

## 2017-10-17 DIAGNOSIS — O3402 Maternal care for unspecified congenital malformation of uterus, second trimester: Secondary | ICD-10-CM

## 2017-10-17 DIAGNOSIS — Z3A21 21 weeks gestation of pregnancy: Secondary | ICD-10-CM

## 2017-10-30 ENCOUNTER — Encounter (HOSPITAL_COMMUNITY): Payer: Self-pay | Admitting: *Deleted

## 2017-10-31 ENCOUNTER — Other Ambulatory Visit (HOSPITAL_COMMUNITY): Payer: Self-pay | Admitting: Obstetrics and Gynecology

## 2017-10-31 ENCOUNTER — Encounter (HOSPITAL_COMMUNITY): Payer: Self-pay

## 2017-10-31 ENCOUNTER — Ambulatory Visit (HOSPITAL_COMMUNITY)
Admission: RE | Admit: 2017-10-31 | Discharge: 2017-10-31 | Disposition: A | Discharge status: Home / Self Care | Payer: BLUE CROSS/BLUE SHIELD | Source: Ambulatory Visit | Attending: Obstetrics and Gynecology | Admitting: Obstetrics and Gynecology

## 2017-10-31 DIAGNOSIS — Z3A21 21 weeks gestation of pregnancy: Secondary | ICD-10-CM

## 2017-10-31 DIAGNOSIS — Z3689 Encounter for other specified antenatal screening: Secondary | ICD-10-CM

## 2017-10-31 DIAGNOSIS — Q513 Bicornate uterus: Secondary | ICD-10-CM

## 2017-10-31 DIAGNOSIS — O34592 Maternal care for other abnormalities of gravid uterus, second trimester: Secondary | ICD-10-CM

## 2017-10-31 DIAGNOSIS — O358XX0 Maternal care for other (suspected) fetal abnormality and damage, not applicable or unspecified: Secondary | ICD-10-CM | POA: Insufficient documentation

## 2017-10-31 DIAGNOSIS — O359XX0 Maternal care for (suspected) fetal abnormality and damage, unspecified, not applicable or unspecified: Secondary | ICD-10-CM

## 2017-10-31 DIAGNOSIS — Z3A20 20 weeks gestation of pregnancy: Secondary | ICD-10-CM

## 2017-10-31 DIAGNOSIS — O3402 Maternal care for unspecified congenital malformation of uterus, second trimester: Secondary | ICD-10-CM

## 2017-12-03 ENCOUNTER — Encounter (HOSPITAL_COMMUNITY): Payer: Self-pay | Admitting: *Deleted

## 2017-12-03 ENCOUNTER — Inpatient Hospital Stay (HOSPITAL_COMMUNITY)
Admission: AD | Admit: 2017-12-03 | Discharge: 2017-12-05 | DRG: 806 | Disposition: A | Discharge status: Home / Self Care | Payer: BLUE CROSS/BLUE SHIELD | Source: Ambulatory Visit | Attending: Obstetrics and Gynecology | Admitting: Obstetrics and Gynecology

## 2017-12-03 DIAGNOSIS — Z3A25 25 weeks gestation of pregnancy: Secondary | ICD-10-CM | POA: Diagnosis not present

## 2017-12-03 DIAGNOSIS — F1290 Cannabis use, unspecified, uncomplicated: Secondary | ICD-10-CM | POA: Diagnosis present

## 2017-12-03 DIAGNOSIS — Z3A26 26 weeks gestation of pregnancy: Secondary | ICD-10-CM

## 2017-12-03 DIAGNOSIS — O0932 Supervision of pregnancy with insufficient antenatal care, second trimester: Secondary | ICD-10-CM | POA: Diagnosis not present

## 2017-12-03 DIAGNOSIS — O99324 Drug use complicating childbirth: Secondary | ICD-10-CM | POA: Diagnosis present

## 2017-12-03 DIAGNOSIS — Q513 Bicornate uterus: Secondary | ICD-10-CM

## 2017-12-03 DIAGNOSIS — O364XX0 Maternal care for intrauterine death, not applicable or unspecified: Secondary | ICD-10-CM | POA: Diagnosis not present

## 2017-12-03 DIAGNOSIS — Z362 Encounter for other antenatal screening follow-up: Secondary | ICD-10-CM | POA: Diagnosis not present

## 2017-12-03 DIAGNOSIS — O9902 Anemia complicating childbirth: Secondary | ICD-10-CM | POA: Diagnosis not present

## 2017-12-03 DIAGNOSIS — Z87891 Personal history of nicotine dependence: Secondary | ICD-10-CM | POA: Diagnosis not present

## 2017-12-03 DIAGNOSIS — O039 Complete or unspecified spontaneous abortion without complication: Secondary | ICD-10-CM | POA: Diagnosis not present

## 2017-12-03 DIAGNOSIS — O3402 Maternal care for unspecified congenital malformation of uterus, second trimester: Secondary | ICD-10-CM | POA: Diagnosis not present

## 2017-12-03 DIAGNOSIS — D573 Sickle-cell trait: Secondary | ICD-10-CM | POA: Diagnosis present

## 2017-12-03 DIAGNOSIS — O321XX0 Maternal care for breech presentation, not applicable or unspecified: Secondary | ICD-10-CM | POA: Diagnosis present

## 2017-12-03 LAB — COMPREHENSIVE METABOLIC PANEL
ALK PHOS: 86 U/L (ref 38–126)
ALT: 13 U/L — ABNORMAL LOW (ref 14–54)
ANION GAP: 7 (ref 5–15)
AST: 18 U/L (ref 15–41)
Albumin: 3.7 g/dL (ref 3.5–5.0)
BILIRUBIN TOTAL: 0.3 mg/dL (ref 0.3–1.2)
BUN: 10 mg/dL (ref 6–20)
CO2: 23 mmol/L (ref 22–32)
Calcium: 9.4 mg/dL (ref 8.9–10.3)
Chloride: 105 mmol/L (ref 101–111)
Creatinine, Ser: 0.6 mg/dL (ref 0.44–1.00)
GFR calc non Af Amer: >60 mL/min (ref >60)
Glucose, Bld: 94 mg/dL (ref 65–99)
Potassium: 4.1 mmol/L (ref 3.5–5.1)
SODIUM: 135 mmol/L (ref 135–145)
TOTAL PROTEIN: 7.6 g/dL (ref 6.5–8.1)

## 2017-12-03 LAB — CBC
HCT: 37 % (ref 36.0–46.0)
HEMOGLOBIN: 12.5 g/dL (ref 12.0–15.0)
MCH: 29.9 pg (ref 26.0–34.0)
MCHC: 33.8 g/dL (ref 30.0–36.0)
MCV: 88.5 fL (ref 78.0–100.0)
Platelets: 180 10*3/uL (ref 150–400)
RBC: 4.18 MIL/uL (ref 3.87–5.11)
RDW: 11.9 % (ref 11.5–15.5)
WBC: 7.1 10*3/uL (ref 4.0–10.5)

## 2017-12-03 LAB — PROTIME-INR
INR: 0.94
PROTHROMBIN TIME: 12.4 s (ref 11.4–15.2)

## 2017-12-03 LAB — HEMOGLOBIN A1C
Hgb A1c MFr Bld: 4.9 % (ref 4.8–5.6)
Mean Plasma Glucose: 93.93 mg/dL

## 2017-12-03 LAB — APTT: APTT: 24 s (ref 24–36)

## 2017-12-03 LAB — RAPID URINE DRUG SCREEN, HOSP PERFORMED
Amphetamines: NOT DETECTED
BENZODIAZEPINES: NOT DETECTED
Barbiturates: NOT DETECTED
COCAINE: NOT DETECTED
Opiates: NOT DETECTED
Tetrahydrocannabinol: POSITIVE — AB

## 2017-12-03 LAB — FIBRINOGEN: Fibrinogen: 341 mg/dL (ref 210–475)

## 2017-12-03 LAB — D-DIMER, QUANTITATIVE: D-Dimer, Quant: 4.93 ug/mL-FEU — ABNORMAL HIGH (ref 0.00–0.50)

## 2017-12-03 LAB — TSH: TSH: 1.861 u[IU]/mL (ref 0.350–4.500)

## 2017-12-03 MED ORDER — MISOPROSTOL 200 MCG PO TABS
600.0000 ug | ORAL_TABLET | Freq: Once | ORAL | Status: AC
  Administered 2017-12-03: 600 ug via VAGINAL
  Filled 2017-12-03: qty 3

## 2017-12-03 MED ORDER — LACTATED RINGERS IV SOLN
INTRAVENOUS | Status: DC
  Administered 2017-12-03: 20:00:00 via INTRAVENOUS

## 2017-12-03 MED ORDER — PROMETHAZINE HCL 25 MG/ML IJ SOLN
25.0000 mg | Freq: Four times a day (QID) | INTRAMUSCULAR | Status: DC | PRN
  Administered 2017-12-04: 25 mg via INTRAVENOUS
  Filled 2017-12-03: qty 1

## 2017-12-03 MED ORDER — FENTANYL CITRATE (PF) 100 MCG/2ML IJ SOLN
100.0000 ug | INTRAMUSCULAR | Status: DC | PRN
  Administered 2017-12-03 – 2017-12-04 (×3): 100 ug via INTRAVENOUS
  Filled 2017-12-03 (×3): qty 2

## 2017-12-03 MED ORDER — IBUPROFEN 600 MG PO TABS
600.0000 mg | ORAL_TABLET | Freq: Three times a day (TID) | ORAL | Status: DC
  Administered 2017-12-03: 600 mg via ORAL
  Filled 2017-12-03: qty 1

## 2017-12-03 MED ORDER — ACETAMINOPHEN 325 MG PO TABS
650.0000 mg | ORAL_TABLET | Freq: Four times a day (QID) | ORAL | Status: DC | PRN
  Administered 2017-12-04 (×2): 650 mg via ORAL
  Filled 2017-12-03 (×2): qty 2

## 2017-12-03 NOTE — H&P (Signed)
Veronica Mclean is a 22 y.o. female, G1P0 at 29 weeks, presenting for IUFD at 26 weeks. Pt presented in office today.for anatomy US.  No FHT on [place] with fetus measuring 22.4 with no fluid. Pt prenatal hx remarkable for hx of HSV with no outbreak, sickle cell trait and occ use of marijuana for nausea and smoking at entrance to pregnancy.  Pt also has a bicornuate uterus.  Late entry to care at 21 weeks.  Patient Active Problem List   Diagnosis Date Noted  . IUFD at 20 weeks or more of gestation 12/03/2017  . 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 BV (bacterial vaginosis) 11/25/2012    History of present pregnancy: Patient entered care at 21 weeks  weeks.   EDC of 03/11/2018 was established by LMP.   Anatomy scan:  22 weeks, with abnormal findings and an anterior placenta.     Last evaluation:  Today  OB History    Gravida  1   Para  0   Term  0   Preterm  0   AB  0   Living  0     SAB  0   TAB  0   Ectopic  0   Multiple  0   Live Births  0          Past Medical History:  Diagnosis Date  . Chlamydia 09/2010, 01/2011, 12/2014  . Gonorrhea 09/2010, 12/2014  . HSV infection    Past Surgical History:  Procedure Laterality Date  . NO PAST SURGERIES     Family History: family history is not on file. Social History:  reports that she has quit smoking. Her smoking use included cigars. She started smoking about 4 months ago. She has never used smokeless tobacco. She reports that she does not drink alcohol or use drugs.   Prenatal Transfer Tool  Maternal Diabetes: No Genetic Screening: Declined Maternal Ultrasounds/Referrals: Abnormal:  Findings:   Other:bicornuate uterus Fetal Ultrasounds or other Referrals:  Referred to Materal Fetal Medicine  Maternal Substance Abuse:  Yes:  Type: Smoker, Marijuana Significant Maternal Medications:  None Significant Maternal Lab Results: None  ROS: Review of Systems  All other systems reviewed and are  negative.   No Known Allergies   Dilation: Closed Effacement (%): Thick Exam by:: Isabelle Course, RN Temperature 98.9 F (37.2 C), temperature source Oral, resp. rate 18, height  (1.702 m), weight 72.6 kg (160 lb), last menstrual period 07/15/2017.  Chest clear Heart RRR without murmur Abd gravid, NT, FH appropriate Pelvic: LTC Ext: no edema   Prenatal labs: ABO, Rh: --/--/B POS (11/09 1200) Antibody:  Neg Rubella:   Immune RPR:   NR HBsAg:   Neg HIV:   NR Sickle cell/Hgb electrophoresis:  Sickle cell trait Pap: 2018 wnl GC:  Neg Chlamydia:  Neg Other:   Hgb 12.5      Assessment/Plan: IUP at 22.4 with fetal demise, consult with Dr. Normand Sloop  Plan: Admit to Birthing Suite  Cytotec every 6 hours Pain med/epidural prn   Henderson Newcomer ProtheroCNM, MSN 12/03/2017, 8:21 PM

## 2017-12-03 NOTE — Progress Notes (Signed)
Subjective: Pt feeling some pressure and cramping.  Requesting pain medication.    Objective: BP 114/61   Pulse 78   Temp (!) 101 F (38.3 C) (Axillary)   Resp 16   Ht  (1.702 m)   Wt 72.6 kg (160 lb)   LMP 07/15/2017 Comment: unsure LMP  BMI 25.06 kg/m  No intake/output data recorded. No intake/output data recorded.  SVE:   Dilation: Closed Effacement (%): Thick Exam by:: Isabelle Course, RN  Assessment:  22.4 IUFD Febrile  Plan: Motrin Fentanyl   Kenney Houseman CNM, MSN 12/03/2017, 10:17 PM

## 2017-12-03 NOTE — MAU Note (Addendum)
Pt sent in for direct admit, "the baby has died.", per family.

## 2017-12-04 ENCOUNTER — Other Ambulatory Visit: Payer: Self-pay

## 2017-12-04 ENCOUNTER — Encounter (HOSPITAL_COMMUNITY): Payer: Self-pay

## 2017-12-04 DIAGNOSIS — Q513 Bicornate uterus: Secondary | ICD-10-CM | POA: Diagnosis not present

## 2017-12-04 DIAGNOSIS — O364XX0 Maternal care for intrauterine death, not applicable or unspecified: Secondary | ICD-10-CM | POA: Diagnosis not present

## 2017-12-04 DIAGNOSIS — O0932 Supervision of pregnancy with insufficient antenatal care, second trimester: Secondary | ICD-10-CM | POA: Diagnosis not present

## 2017-12-04 MED ORDER — COCONUT OIL OIL: 1.0000 "application " | TOPICAL_OIL | Status: DC | PRN

## 2017-12-04 MED ORDER — EPHEDRINE 5 MG/ML INJ
10.0000 mg | INTRAVENOUS | Status: DC | PRN
  Filled 2017-12-04: qty 2

## 2017-12-04 MED ORDER — ACETAMINOPHEN 325 MG PO TABS: 650.0000 mg | ORAL_TABLET | ORAL | Status: DC | PRN

## 2017-12-04 MED ORDER — PHENYLEPHRINE 40 MCG/ML (10ML) SYRINGE FOR IV PUSH (FOR BLOOD PRESSURE SUPPORT)
PREFILLED_SYRINGE | INTRAVENOUS | Status: AC
  Filled 2017-12-04: qty 20

## 2017-12-04 MED ORDER — OXYTOCIN 40 UNITS IN LACTATED RINGERS INFUSION - SIMPLE MED
2.5000 [IU]/h | INTRAVENOUS | Status: DC
  Filled 2017-12-04: qty 1000

## 2017-12-04 MED ORDER — PHENYLEPHRINE 40 MCG/ML (10ML) SYRINGE FOR IV PUSH (FOR BLOOD PRESSURE SUPPORT)
80.0000 ug | PREFILLED_SYRINGE | INTRAVENOUS | Status: DC | PRN
  Filled 2017-12-04: qty 5

## 2017-12-04 MED ORDER — FENTANYL 2.5 MCG/ML BUPIVACAINE 1/10 % EPIDURAL INFUSION (WH - ANES)
INTRAMUSCULAR | Status: AC
  Filled 2017-12-04: qty 100

## 2017-12-04 MED ORDER — ONDANSETRON HCL 4 MG/2ML IJ SOLN: 4.0000 mg | Freq: Four times a day (QID) | INTRAMUSCULAR | Status: DC | PRN

## 2017-12-04 MED ORDER — LACTATED RINGERS IV SOLN: 500.0000 mL | Freq: Once | INTRAVENOUS | Status: DC

## 2017-12-04 MED ORDER — PRENATAL MULTIVITAMIN CH: 1.0000 | ORAL_TABLET | Freq: Every day | ORAL | Status: DC

## 2017-12-04 MED ORDER — DIPHENHYDRAMINE HCL 50 MG/ML IJ SOLN: 12.5000 mg | INTRAMUSCULAR | Status: DC | PRN

## 2017-12-04 MED ORDER — FENTANYL 2.5 MCG/ML BUPIVACAINE 1/10 % EPIDURAL INFUSION (WH - ANES): 14.0000 mL/h | INTRAMUSCULAR | Status: DC | PRN

## 2017-12-04 MED ORDER — OXYTOCIN BOLUS FROM INFUSION
500.0000 mL | Freq: Once | INTRAVENOUS | Status: AC
  Administered 2017-12-04: 500 mL via INTRAVENOUS

## 2017-12-04 MED ORDER — MISOPROSTOL 200 MCG PO TABS
600.0000 ug | ORAL_TABLET | Freq: Once | ORAL | Status: AC
  Administered 2017-12-04: 600 ug via VAGINAL
  Filled 2017-12-04: qty 3

## 2017-12-04 MED ORDER — WITCH HAZEL-GLYCERIN EX PADS: 1.0000 "application " | MEDICATED_PAD | CUTANEOUS | Status: DC | PRN

## 2017-12-04 MED ORDER — SIMETHICONE 80 MG PO CHEW: 80.0000 mg | CHEWABLE_TABLET | ORAL | Status: DC | PRN

## 2017-12-04 MED ORDER — ONDANSETRON HCL 4 MG PO TABS: 4.0000 mg | ORAL_TABLET | ORAL | Status: DC | PRN

## 2017-12-04 MED ORDER — TETANUS-DIPHTH-ACELL PERTUSSIS 5-2.5-18.5 LF-MCG/0.5 IM SUSP: 0.5000 mL | Freq: Once | INTRAMUSCULAR | Status: DC

## 2017-12-04 MED ORDER — ONDANSETRON HCL 4 MG/2ML IJ SOLN: 4.0000 mg | INTRAMUSCULAR | Status: DC | PRN

## 2017-12-04 MED ORDER — DIBUCAINE 1 % RE OINT: 1.0000 "application " | TOPICAL_OINTMENT | RECTAL | Status: DC | PRN

## 2017-12-04 MED ORDER — DIPHENHYDRAMINE HCL 25 MG PO CAPS: 25.0000 mg | ORAL_CAPSULE | Freq: Four times a day (QID) | ORAL | Status: DC | PRN

## 2017-12-04 MED ORDER — SENNOSIDES-DOCUSATE SODIUM 8.6-50 MG PO TABS: 2.0000 | ORAL_TABLET | ORAL | Status: DC

## 2017-12-04 MED ORDER — SODIUM CHLORIDE 0.9 % IV SOLN
3.0000 g | Freq: Four times a day (QID) | INTRAVENOUS | Status: DC
  Administered 2017-12-04 – 2017-12-05 (×4): 3 g via INTRAVENOUS
  Filled 2017-12-04 (×6): qty 3

## 2017-12-04 MED ORDER — LACTATED RINGERS IV SOLN
INTRAVENOUS | Status: DC
  Administered 2017-12-04: 04:00:00 via INTRAVENOUS

## 2017-12-04 MED ORDER — BENZOCAINE-MENTHOL 20-0.5 % EX AERO: 1.0000 "application " | INHALATION_SPRAY | CUTANEOUS | Status: DC | PRN

## 2017-12-04 MED ORDER — ZOLPIDEM TARTRATE 5 MG PO TABS: 5.0000 mg | ORAL_TABLET | Freq: Every evening | ORAL | Status: DC | PRN

## 2017-12-04 MED ORDER — IBUPROFEN 600 MG PO TABS
600.0000 mg | ORAL_TABLET | Freq: Four times a day (QID) | ORAL | Status: DC
  Administered 2017-12-04: 600 mg via ORAL
  Filled 2017-12-04 (×3): qty 1

## 2017-12-04 MED ORDER — LACTATED RINGERS IV SOLN: 500.0000 mL | INTRAVENOUS | Status: DC | PRN

## 2017-12-04 NOTE — Progress Notes (Signed)
INitial visit with Veronica Mclean who was accompanied by a sleeping visitor to introduce spiritual care services and offer support upon the loss of her baby, who was delivered this morning.  I also delivered the patient's photographs.  Pt had been sleeping prior to my visit and was awakened by someone from pharmacy, so she was tired when I visited.  She requested someone follow up this evening.  Will notify night chaplain.  Please page as further needs arise.  Maryanna Shape. Carley Hammed, M.Div. [facility] Chaplain Pager [phone] Office [phone]      12/04/17 1352  Clinical Encounter Type  Visited With Patient and family together  Visit Type Initial  Spiritual Encounters  Spiritual Needs Emotional;Grief support

## 2017-12-05 DIAGNOSIS — Q513 Bicornate uterus: Secondary | ICD-10-CM

## 2017-12-05 LAB — CBC
HCT: 33.9 % — ABNORMAL LOW (ref 36.0–46.0)
Hemoglobin: 11.3 g/dL — ABNORMAL LOW (ref 12.0–15.0)
MCH: 29.6 pg (ref 26.0–34.0)
MCHC: 33.3 g/dL (ref 30.0–36.0)
MCV: 88.7 fL (ref 78.0–100.0)
Platelets: 152 10*3/uL (ref 150–400)
RBC: 3.82 MIL/uL — ABNORMAL LOW (ref 3.87–5.11)
RDW: 12 % (ref 11.5–15.5)
WBC: 8.5 10*3/uL (ref 4.0–10.5)

## 2017-12-05 LAB — TORCH-IGM(TOXO/ RUB/ CMV/ HSV) W TITER
HSVI/II Comb IgM: 1.07 Ratio — ABNORMAL HIGH (ref 0.00–0.90)
Rubella IgM: <20 AU/mL (ref 0.0–19.9)

## 2017-12-05 LAB — INFECT DISEASE AB IGM REFLEX 1

## 2017-12-05 MED ORDER — IBUPROFEN 600 MG PO TABS: 600.0000 mg | ORAL_TABLET | Freq: Four times a day (QID) | ORAL | 2 refills | Status: DC | PRN

## 2017-12-05 NOTE — Discharge Summary (Signed)
OB Discharge Summary     Patient Name: Veronica Mclean DOB: October 05, 1995 MRN: [id number]  Date of admission: 12/03/2017 Delivering MD: Illene Bolus A   Date of discharge: 12/05/2017  Admitting diagnosis: 26WKS MISCARRIAGE Intrauterine pregnancy: [redacted]w[redacted]d     Secondary diagnosis:  Principal Problem:   IUFD at 20 weeks or more of gestation Active Problems:   Bicornate uterus  Additional problems: None     Discharge diagnosis: IUFD, vaginal delivery after induction.                                                                                            Post partum procedures:NA  Augmentation: Cytotec  Complications: None  Hospital course:  Induction of Labor With Vaginal Delivery   22 y.o. yo G1P0100 at [redacted]w[redacted]d was admitted to the hospital 12/03/2017 for induction of labor.  Indication for induction: IUFD at 26 1/7 weeks.  Patient had an uncomplicated labor course as follows: Membrane Rupture Time/Date: 7:10 AM ,12/04/2017   Intrapartum Procedures: Episiotomy: None [1]                                         Lacerations:  None [1]  Patient had delivery of a Non Viable infant.  Information for the patient's newborn:  [place], [place] FD [[id number]]  Delivery Method: Vaginal, Spontaneous(Filed from Delivery Summary)   12/04/2017  Details of delivery can be found in separate delivery note.  She spiked a temp just after delivery to 102.5, with initiation of Unasyn x 4 doses.  She remained afebrile x 24 hours after that single temp.  Patient is discharged home 12/05/17.  Placental tissue was sent for chromosomal analysis and tissue hybridization, and usual pathology.  TORCH titers are still pending at the time of d/c.  UDS was positive for THC, TSH WNL, Hgb A1C WNL.  Baby found to be a demise on [place] for anatomy on 12/03/17, with baby measuring 22 4/7 weeks with no fluid. Admitted for induction of labor with cytotech.  She will go home to stay with her mother in  [place].  Physical exam  Vitals:   12/04/17 1536 12/04/17 1924 12/04/17 2338 12/05/17 0353  BP: (!) 98/54 105/65 (!) 95/44 (!) 92/59  Pulse: 68 77 67 64  Resp: Temp: 98.9 F (37.2 C) 98.5 F (36.9 C) 98 F (36.7 C) 98 F (36.7 C)  TempSrc: Oral Oral Oral Oral  SpO2: 99% 95% 99% 98%  Weight:      Height:       General: alert Lochia: appropriate Uterine Fundus: firm Incision: Perineum intact DVT Evaluation: No evidence of DVT seen on physical exam. Negative Homan's sign. Labs: CBC Latest Ref Rng & Units 12/05/2017 12/03/2017 07/13/2016  WBC 4.0 - 10.5 K/uL 8.5 7.1 4.9  Hemoglobin 12.0 - 15.0 g/dL 11.3(L) 12.5 13.2  Hematocrit 36.0 - 46.0 % 33.9(L) 37.0 39.2  Platelets 150 - 400 K/uL 152 180 156   CMP Latest Ref Rng & Units 12/03/2017  Glucose 65 -  99 mg/dL 94  BUN 6 - 20 mg/dL 10  Creatinine 0.449.14- 7.82 mg/dL 9.56  Sodium 213 - 086 mmol/L 135  Potassium 3.5 - 5.1 mmol/L 4.1  Chloride 101 - 111 mmol/L 105  CO2 22 - 32 mmol/L 23  Calcium 8.9 - 10.3 mg/dL 9.4  Total Protein 6.5 - 8.1 g/dL 7.6  Total Bilirubin 0.3 - 1.2 mg/dL 0.3  Alkaline Phos 38 - 126 U/L 86  AST 15 - 41 U/L 18  ALT 14 - 54 U/L 13(L)    Discharge instruction: per After Visit Summary  After visit meds:  Allergies as of 12/05/2017   No Known Allergies     Medication List    TAKE these medications   ibuprofen 600 MG tablet Commonly known as:  ADVIL,MOTRIN Take 1 tablet (600 mg total) by mouth every 6 (six) hours as needed.   PRENATAL VITAMIN PO Take 1 tablet by mouth daily.       Diet: routine diet  Activity: Advance as tolerated. Pelvic rest for 6 weeks.   Outpatient follow up:2 weeks Follow up Appt:No future appointments. Follow up Visit:No follow-ups on file.  Postpartum contraception: Not Discussed  Newborn Data: Non-viable female Birth Weight: 1 lb 2.9 oz (536 g) APGAR: 0, 0  Newborn Delivery   Birth date/time:  12/04/2017 07:10:00 Delivery type:  Vaginal,  Spontaneous      Disposition:morgue   12/05/2017 Nigel Bridgeman, CNM

## 2017-12-05 NOTE — Progress Notes (Signed)
0830- Discharge instructions given to patient and her mother. PP care, f/u appoint and medications understood per pt. Pt walked out per preference and escorted by [employer] [facility] at [facility]

## 2017-12-05 NOTE — Discharge Instructions (Signed)
Vaginal Delivery, Care After °Refer to this sheet in the next few weeks. These instructions provide you with information on caring for yourself after your delivery. Your health care provider may also give you more specific instructions. Your treatment has been planned according to current medical practices, but problems sometimes occur. Call your health care provider if you have any problems or questions after your delivery. °What to expect after your delivery °After your delivery, it is typical to have the following: °· You may feel pain in the vaginal area for several days after delivery. If you had an incision or a vaginal tear, the area will probably continue to be tender to the touch for several weeks. °· You may feel very fatigued after a vaginal delivery. °· You may have vaginal bleeding and discharge that will start out red, then become pink, then yellow, then white. Altogether, this usually lasts for about 6 weeks. °· The combination of having lost your baby and changing hormones from the delivery can make you feel very sad. You may also experience emotions that change very quickly. Some of the emotions people often notice after loss include: °? Anger. °? Denial. °? Guilt. °? Sorrow. °? Depression. °? Grief. °? Relationship problems. ° °Follow these instructions at home: °· Consider seeking support for your loss. Some forms of support that you might consider include your religious leader, friends, family, a professional counselor, or a bereavement support group. °· Take medicines only as directed by your health care provider. °· Continue to use good perineal care. Good perineal care includes: °? Wiping your perineum from front to back. °? Keeping your perineum clean. °· Do not use tampons or douche until your health care provider says it is okay. °· Shower, wash your hair, and take tub baths as directed by your health care provider. °· Wear a well-fitting bra that provides breast support. °· Drink enough  fluids to keep your urine clear or pale yellow. °· Eat healthy foods. °· Eat high-fiber foods every day, such as whole grain cereals and breads, brown rice, beans, and fresh fruits and vegetables. These foods may help prevent or relieve constipation. °· Follow your health care provider's directions about resuming activities such as climbing stairs, driving, lifting, exercising, or traveling. °· Increase your activities gradually. °· Talk to your health care provider about resuming sexual activities. This depends on your risk of infection, your rate of healing, and your comfort and desire to resume sexual activity. °· Try to have someone help you with your household activities for at least a few days after you leave the hospital. °· Rest as much as possible. °· Keep all of your scheduled postpartum appointments. It is very important to keep your scheduled follow-up appointments. At these appointments, your health care provider will be checking to make sure that you are healing physically and emotionally. °· Do not drink alcohol, especially if you are taking medicine to relieve pain. °· Do not use any tobacco products including cigarettes, chewing tobacco, or electronic cigarettes. If you need help quitting, ask your health care provider. °· Do not use illegal drugs. °Contact a health care provider if: °· You feel sad or depressed. °· You have thoughts of hurting yourself. °· You are having trouble eating or sleeping. °· You cannot enjoy the things in life you have previously enjoyed. °· You are passing large clots from your vagina. Save any clots to show your health care provider. °· You have a bad smelling discharge from your vagina. °·   You have trouble urinating. °· You are urinating frequently. °· You have pain when you urinate. °· You have a change in your bowel movements. °· You have increasing redness, pain, or swelling near your incision or vaginal tear. °· You have pus draining from your incision or vaginal  tear. °· Your incision or vaginal tear is separating. °· You have painful, hard, or reddened breasts. °· You have a severe headache. °· You have blurred vision or see spots. °· You are dizzy or light-headed. °· You have a rash. °· You have nausea or vomiting. °· You have not had a menstrual period by the 12th week after delivery. °· You have a fever. °Get help right away if: °· You are concerned that you may hurt yourself or you are considering suicide. °· You have persistent pain. °· You have chest pain. °· You have shortness of breath. °· You faint. °· You have leg pain. °· You have stomach pain. °· Your vaginal bleeding saturates two or more sanitary pads in 1 hour. °This information is not intended to replace advice given to you by your health care provider. Make sure you discuss any questions you have with your health care provider. °Document Released: 11/22/2013 Document Revised: 12/14/2015 Document Reviewed: 08/26/2013 °Elsevier Interactive Patient Education © 2018 [employer] Inc. ° °

## 2017-12-15 ENCOUNTER — Emergency Department (HOSPITAL_BASED_OUTPATIENT_CLINIC_OR_DEPARTMENT_OTHER)
Admission: EM | Admit: 2017-12-15 | Discharge: 2017-12-15 | Disposition: A | Discharge status: Home / Self Care | Payer: BLUE CROSS/BLUE SHIELD | Attending: Emergency Medicine | Admitting: Emergency Medicine

## 2017-12-15 ENCOUNTER — Emergency Department (HOSPITAL_BASED_OUTPATIENT_CLINIC_OR_DEPARTMENT_OTHER): Payer: BLUE CROSS/BLUE SHIELD

## 2017-12-15 ENCOUNTER — Other Ambulatory Visit: Payer: Self-pay

## 2017-12-15 ENCOUNTER — Encounter (HOSPITAL_BASED_OUTPATIENT_CLINIC_OR_DEPARTMENT_OTHER): Payer: Self-pay

## 2017-12-15 DIAGNOSIS — Y929 Unspecified place or not applicable: Secondary | ICD-10-CM | POA: Insufficient documentation

## 2017-12-15 DIAGNOSIS — Z87891 Personal history of nicotine dependence: Secondary | ICD-10-CM | POA: Insufficient documentation

## 2017-12-15 DIAGNOSIS — Z79899 Other long term (current) drug therapy: Secondary | ICD-10-CM | POA: Diagnosis not present

## 2017-12-15 DIAGNOSIS — Y939 Activity, unspecified: Secondary | ICD-10-CM | POA: Diagnosis not present

## 2017-12-15 DIAGNOSIS — Y999 Unspecified external cause status: Secondary | ICD-10-CM | POA: Insufficient documentation

## 2017-12-15 DIAGNOSIS — S0083XA Contusion of other part of head, initial encounter: Secondary | ICD-10-CM | POA: Diagnosis not present

## 2017-12-15 DIAGNOSIS — S0993XA Unspecified injury of face, initial encounter: Secondary | ICD-10-CM | POA: Diagnosis not present

## 2017-12-15 DIAGNOSIS — M7989 Other specified soft tissue disorders: Secondary | ICD-10-CM | POA: Diagnosis not present

## 2017-12-15 MED ORDER — AMOXICILLIN-POT CLAVULANATE 875-125 MG PO TABS: 1.0000 | ORAL_TABLET | Freq: Two times a day (BID) | ORAL | 0 refills | Status: DC
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
## 2017-12-19 ENCOUNTER — Other Ambulatory Visit: Payer: Self-pay | Admitting: Women's Health

## 2017-12-19 DIAGNOSIS — R300 Dysuria: Secondary | ICD-10-CM | POA: Diagnosis not present

## 2017-12-19 DIAGNOSIS — Q513 Bicornate uterus: Secondary | ICD-10-CM

## 2017-12-19 NOTE — Progress Notes (Signed)
Hx of missed ab at 26 wks.  12/03/17 Hx of bicornate uterus found out with pregnancy

## 2017-12-25 LAB — TISSUE HYBRIDIZATION TO NCBH

## 2017-12-25 LAB — CHROMOSOME STD, POC(TISSUE)-NCBH

## 2018-01-13 DIAGNOSIS — Z308 Encounter for other contraceptive management: Secondary | ICD-10-CM | POA: Diagnosis not present

## 2018-01-28 ENCOUNTER — Emergency Department (HOSPITAL_COMMUNITY)
Admission: EM | Admit: 2018-01-28 | Discharge: 2018-01-28 | Disposition: A | Discharge status: Home / Self Care | Payer: BLUE CROSS/BLUE SHIELD | Attending: Emergency Medicine | Admitting: Emergency Medicine

## 2018-01-28 ENCOUNTER — Other Ambulatory Visit: Payer: Self-pay

## 2018-01-28 ENCOUNTER — Encounter (HOSPITAL_COMMUNITY): Payer: Self-pay | Admitting: *Deleted

## 2018-01-28 ENCOUNTER — Emergency Department (HOSPITAL_COMMUNITY): Payer: BLUE CROSS/BLUE SHIELD

## 2018-01-28 DIAGNOSIS — Z87891 Personal history of nicotine dependence: Secondary | ICD-10-CM | POA: Insufficient documentation

## 2018-01-28 DIAGNOSIS — N3090 Cystitis, unspecified without hematuria: Secondary | ICD-10-CM | POA: Insufficient documentation

## 2018-01-28 DIAGNOSIS — R1032 Left lower quadrant pain: Secondary | ICD-10-CM | POA: Diagnosis not present

## 2018-01-28 DIAGNOSIS — N739 Female pelvic inflammatory disease, unspecified: Secondary | ICD-10-CM | POA: Insufficient documentation

## 2018-01-28 DIAGNOSIS — N939 Abnormal uterine and vaginal bleeding, unspecified: Secondary | ICD-10-CM | POA: Diagnosis not present

## 2018-01-28 DIAGNOSIS — N730 Acute parametritis and pelvic cellulitis: Secondary | ICD-10-CM

## 2018-01-28 LAB — CBC WITH DIFFERENTIAL/PLATELET
BASOS PCT: 0 %
Basophils Absolute: 0 10*3/uL (ref 0.0–0.1)
EOS ABS: 0 10*3/uL (ref 0.0–0.7)
Eosinophils Relative: 0 %
HCT: 43.1 % (ref 36.0–46.0)
HEMOGLOBIN: 14.6 g/dL (ref 12.0–15.0)
Lymphocytes Relative: 7 %
Lymphs Abs: 0.7 10*3/uL (ref 0.7–4.0)
MCH: 28.9 pg (ref 26.0–34.0)
MCHC: 33.9 g/dL (ref 30.0–36.0)
MCV: 85.3 fL (ref 78.0–100.0)
MONOS PCT: 4 %
Monocytes Absolute: 0.4 10*3/uL (ref 0.1–1.0)
NEUTROS PCT: 89 %
Neutro Abs: 8.6 10*3/uL — ABNORMAL HIGH (ref 1.7–7.7)
PLATELETS: 186 10*3/uL (ref 150–400)
RBC: 5.05 MIL/uL (ref 3.87–5.11)
RDW: 12.9 % (ref 11.5–15.5)
WBC: 9.6 10*3/uL (ref 4.0–10.5)

## 2018-01-28 LAB — URINALYSIS, ROUTINE W REFLEX MICROSCOPIC
Bilirubin Urine: NEGATIVE
GLUCOSE, UA: NEGATIVE mg/dL
Ketones, ur: NEGATIVE mg/dL
Nitrite: NEGATIVE
PH: 6 (ref 5.0–8.0)
PROTEIN: NEGATIVE mg/dL
SPECIFIC GRAVITY, URINE: 1.013 (ref 1.005–1.030)

## 2018-01-28 LAB — COMPREHENSIVE METABOLIC PANEL
ALBUMIN: 4.6 g/dL (ref 3.5–5.0)
ALK PHOS: 56 U/L (ref 38–126)
ALT: 19 U/L (ref 0–44)
ANION GAP: 10 (ref 5–15)
AST: 24 U/L (ref 15–41)
BUN: 9 mg/dL (ref 6–20)
CALCIUM: 9.7 mg/dL (ref 8.9–10.3)
CO2: 26 mmol/L (ref 22–32)
CREATININE: 0.67 mg/dL (ref 0.44–1.00)
Chloride: 106 mmol/L (ref 98–111)
GFR calc Af Amer: >60 mL/min (ref >60)
GFR calc non Af Amer: >60 mL/min (ref >60)
GLUCOSE: 93 mg/dL (ref 70–99)
Potassium: 3.7 mmol/L (ref 3.5–5.1)
SODIUM: 142 mmol/L (ref 135–145)
Total Bilirubin: 0.7 mg/dL (ref 0.3–1.2)
Total Protein: 8.3 g/dL — ABNORMAL HIGH (ref 6.5–8.1)

## 2018-01-28 LAB — I-STAT BETA HCG BLOOD, ED (MC, WL, AP ONLY): I-stat hCG, quantitative: <5 m[IU]/mL (ref <5)

## 2018-01-28 LAB — WET PREP, GENITAL
Clue Cells Wet Prep HPF POC: NONE SEEN
SPERM: NONE SEEN
Trich, Wet Prep: NONE SEEN
Yeast Wet Prep HPF POC: NONE SEEN

## 2018-01-28 LAB — LIPASE, BLOOD: Lipase: 39 U/L (ref 11–51)

## 2018-01-28 MED ORDER — ONDANSETRON HCL 4 MG PO TABS: 4.0000 mg | ORAL_TABLET | Freq: Three times a day (TID) | ORAL | 0 refills | Status: DC | PRN

## 2018-01-28 MED ORDER — ONDANSETRON HCL 4 MG/2ML IJ SOLN
4.0000 mg | Freq: Once | INTRAMUSCULAR | Status: AC
  Administered 2018-01-28: 4 mg via INTRAVENOUS
  Filled 2018-01-28: qty 2

## 2018-01-28 MED ORDER — MORPHINE SULFATE (PF) 4 MG/ML IV SOLN
4.0000 mg | Freq: Once | INTRAVENOUS | Status: AC
  Administered 2018-01-28: 4 mg via INTRAVENOUS
  Filled 2018-01-28: qty 1

## 2018-01-28 MED ORDER — SODIUM CHLORIDE 0.9 % IV BOLUS
1000.0000 mL | Freq: Once | INTRAVENOUS | Status: AC
  Administered 2018-01-28: 1000 mL via INTRAVENOUS

## 2018-01-28 MED ORDER — CEPHALEXIN 500 MG PO CAPS: 500.0000 mg | ORAL_CAPSULE | Freq: Two times a day (BID) | ORAL | 0 refills | Status: DC

## 2018-01-28 MED ORDER — DOXYCYCLINE HYCLATE 100 MG PO TABS
100.0000 mg | ORAL_TABLET | Freq: Once | ORAL | Status: AC
  Administered 2018-01-28: 100 mg via ORAL
  Filled 2018-01-28: qty 1

## 2018-01-28 MED ORDER — DEXTROSE 5 % IV SOLN: 250.0000 mg | Freq: Once | INTRAVENOUS | Status: DC

## 2018-01-28 MED ORDER — DOXYCYCLINE HYCLATE 100 MG PO CAPS: 100.0000 mg | ORAL_CAPSULE | Freq: Two times a day (BID) | ORAL | 0 refills | Status: AC

## 2018-01-28 MED ORDER — CEFTRIAXONE SODIUM 250 MG IJ SOLR
250.0000 mg | Freq: Once | INTRAMUSCULAR | Status: AC
  Administered 2018-01-28: 250 mg via INTRAMUSCULAR
  Filled 2018-01-28: qty 250

## 2018-01-28 NOTE — ED Triage Notes (Signed)
Pt c/o severe left side lower abdominal pain that started suddenly this morning, pt also reports waking up with vaginal bleeding she is unsure if this is a period, she had still birth a month ago and didn't have regular period sine. Pt is crying in triage, mom sts she had one episode of emesis on her way to ER

## 2018-01-28 NOTE — ED Provider Notes (Signed)
[facility]-EMERGENCY DEPT Provider Note   CSN: [id number] Arrival date & time: 01/28/18  1432     History   Chief Complaint Chief Complaint  Patient presents with  . Abdominal Pain  . Vaginal Bleeding    HPI Veronica Mclean is a 22 y.o. female with recent history of missed abortion at 19 weeks (12/03/2017), prior STDs who presents emergency department today for abdominal pain and vaginal bleeding.  Patient states she woke this morning with severe, 9/10 left lower abdominal/pelvic pain that radiated to her lower back.  She notes she had associated vaginal bleeding with this that is continued throughout the day but has slowed.  She notes she has gone through 2 pads today.  She states that she has had irregular bleeding since her missed abortion with last bleeding 3 weeks ago.  She reports her abdominal/pelvic pain is constant, cramping in nature and worse with movement.  She is taken Tylenol for symptoms without any relief.  Patient notes that she has associated nonbilious, nonbloody emesis x5.  The patient has been sexually active with one female partner and does not use protection since missed abortion.  Patient denies any fever, chills, upper abdominal pain, flank pain, urinary symptoms, diarrhea.  She notes normal bowel movement today.  She still passing gas.  No previous abdominal surgeries.  No recent sexual intercourse or manipulation that could cause intravaginal trauma.  HPI  Past Medical History:  Diagnosis Date  . Chlamydia 09/2010, 01/2011, 12/2014  . Gonorrhea 09/2010, 12/2014  . HSV infection     Patient Active Problem List   Diagnosis Date Noted  . 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 IUFD at 20 weeks or more of gestation 12/03/2017  . 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 BV (bacterial vaginosis) 11/25/2012    Past Surgical History:  Procedure Laterality Date  . NO PAST SURGERIES       OB History    Gravida  1   Para  1   Term  0   Preterm  1   AB  0   Living  0     SAB  0   TAB  0   Ectopic  0   Multiple  0   Live Births  0            Home Medications    Prior to Admission medications   Medication Sig Start Date End Date Taking? Authorizing Provider  amoxicillin-clavulanate (AUGMENTIN) 875-125 MG tablet Take 1 tablet by mouth 2 (two) times daily. One po bid x 7 days Patient not taking: Reported on 01/28/2018 12/15/17   Rolan Bucco, MD  ibuprofen (ADVIL,MOTRIN) 600 MG tablet Take 1 tablet (600 mg total) by mouth every 6 (six) hours as needed. Patient not taking: Reported on 01/28/2018 12/05/17   Nigel Bridgeman, CNM    Family History No family history on file.  Social History Social History   Tobacco Use  . Smoking status: Former Smoker    Types: Cigars    Start date: 07/29/2017  . Smokeless tobacco: Never Used  Substance Use Topics  . Alcohol use: No    Alcohol/week: 0.0 oz    Frequency: Never  . Drug use: No     Allergies   Patient has no known allergies.   Review of Systems Review of Systems  All other systems reviewed and are negative.    Physical Exam Updated Vital Signs BP (!) 138/97 (BP Location: Right Arm)  Pulse 79   Temp 98.3 F (36.8 C) (Oral)   Resp 15   Ht 5\' 7"  (1.702 m)   Wt 68 kg (150 lb)   LMP 07/15/2017 Comment: unsure LMP  SpO2 100%   BMI 23.49 kg/m   Physical Exam  Constitutional: She appears well-developed and well-nourished.  Tearful  HENT:  Head: Normocephalic and atraumatic.  Right Ear: External ear normal.  Left Ear: External ear normal.  Nose: Nose normal.  Mouth/Throat: Uvula is midline, oropharynx is clear and moist and mucous membranes are normal. No tonsillar exudate.  Eyes: Pupils are equal, round, and reactive to light. Right eye exhibits no discharge. Left eye exhibits no discharge. No scleral icterus.  Neck: Trachea normal. Neck supple. No spinous process tenderness present. No neck rigidity. Normal range of motion  present.  Cardiovascular: Normal rate, regular rhythm and intact distal pulses.  No murmur heard. Pulses:      Radial pulses are 2+ on the right side, and 2+ on the left side.       Dorsalis pedis pulses are 2+ on the right side, and 2+ on the left side.       Posterior tibial pulses are 2+ on the right side, and 2+ on the left side.  No lower extremity swelling or edema. Calves symmetric in size bilaterally.  Pulmonary/Chest: Effort normal and breath sounds normal. She exhibits no tenderness.  Abdominal: Soft. Bowel sounds are normal. There is no tenderness. There is no rigidity, no rebound, no guarding and no CVA tenderness.  Genitourinary:  Genitourinary Comments: Exam performed by Jacinto HalimMichael M Amarie Viles, exam chaperoned Pelvic exam: normal external genitalia without evidence of trauma. VULVA: normal appearing vulva with no masses, tenderness or lesion. VAGINA: normal appearing vagina with normal color and discharge, no lesions. CERVIX: cervix slightly deviated to right and downfacing. No lesions. Cervical motion tenderness present, cervical os closed with out purulent discharge. There is mild blood in vaginal vault. Wet prep and DNA probe for chlamydia and GC obtained.   ADNEXA: normal adnexa in size. There is adnexal TTP on the right. No masses UTERUS: uterus is normal size, shape, consistency and nontender.   Musculoskeletal: She exhibits no edema.  Lymphadenopathy:    She has no cervical adenopathy.  Neurological: She is alert.  Skin: Skin is warm and dry. No rash noted. She is not diaphoretic.  Psychiatric: She has a normal mood and affect.  Nursing note and vitals reviewed.    ED Treatments / Results  Labs (all labs ordered are listed, but only abnormal results are displayed) Labs Reviewed  WET PREP, GENITAL - Abnormal; Notable for the following components:      Result Value   WBC, Wet Prep HPF POC FEW (*)    All other components within normal limits  COMPREHENSIVE METABOLIC  PANEL - Abnormal; Notable for the following components:   Total Protein 8.3 (*)    All other components within normal limits  CBC WITH DIFFERENTIAL/PLATELET - Abnormal; Notable for the following components:   Neutro Abs 8.6 (*)    All other components within normal limits  URINALYSIS, ROUTINE W REFLEX MICROSCOPIC - Abnormal; Notable for the following components:   Hgb urine dipstick LARGE (*)    Leukocytes, UA MODERATE (*)    RBC / HPF >50 (*)    Bacteria, UA RARE (*)    All other components within normal limits  LIPASE, BLOOD  RPR  HIV ANTIBODY (ROUTINE TESTING)  I-STAT BETA HCG BLOOD, ED ([facility], [facility],  [facility] ONLY)  GC/CHLAMYDIA PROBE AMP ([place]) NOT AT [facility]    EKG None  Radiology US Transvaginal Non-ob  Result Date: 01/28/2018 CLINICAL DATA:  Initial evaluation for acute lower abdominal pain, pelvic pain, vaginal bleeding. Recent miscarriage. EXAM: TRANSABDOMINAL AND TRANSVAGINAL ULTRASOUND OF PELVIS DOPPLER ULTRASOUND OF OVARIES TECHNIQUE: Both transabdominal and transvaginal ultrasound examinations of the pelvis were performed. Transabdominal technique was performed for global imaging of the pelvis including uterus, ovaries, adnexal regions, and pelvic cul-de-sac. It was necessary to proceed with endovaginal exam following the transabdominal exam to visualize the uterus, endometrium, and ovaries. Color and duplex Doppler ultrasound was utilized to evaluate blood flow to the ovaries. COMPARISON:  Prior ultrasound from 09/01/2017 FINDINGS: Uterus Measurements: 9.0 x 4.3 x 6.0 cm. No fibroids or other mass visualized. Septate versus bicornuate uterus noted. Endometrium Endometrial stripe measures 8.2 mm on the right and 7.1 mm on the left. No focal abnormality. Right ovary Measurements: 4.2 x 1.7 x 2.4 cm. Normal appearance/no adnexal mass. Left ovary Measurements: 3.7 x 1.8 x 1.9 cm. Normal appearance/no adnexal mass. Pulsed Doppler evaluation of both ovaries demonstrates normal  low-resistance arterial and venous waveforms. Other findings Small volume free physiologic fluid within the pelvis. IMPRESSION: Negative pelvic ultrasound.  No acute abnormality identified. Electronically Signed   By: Rise Mu M.D.   On: 01/28/2018 18:30   US Pelvis Complete  Result Date: 01/28/2018 CLINICAL DATA:  Initial evaluation for acute lower abdominal pain, pelvic pain, vaginal bleeding. Recent miscarriage. EXAM: TRANSABDOMINAL AND TRANSVAGINAL ULTRASOUND OF PELVIS DOPPLER ULTRASOUND OF OVARIES TECHNIQUE: Both transabdominal and transvaginal ultrasound examinations of the pelvis were performed. Transabdominal technique was performed for global imaging of the pelvis including uterus, ovaries, adnexal regions, and pelvic cul-de-sac. It was necessary to proceed with endovaginal exam following the transabdominal exam to visualize the uterus, endometrium, and ovaries. Color and duplex Doppler ultrasound was utilized to evaluate blood flow to the ovaries. COMPARISON:  Prior ultrasound from 09/01/2017 FINDINGS: Uterus Measurements: 9.0 x 4.3 x 6.0 cm. No fibroids or other mass visualized. Septate versus bicornuate uterus noted. Endometrium Endometrial stripe measures 8.2 mm on the right and 7.1 mm on the left. No focal abnormality. Right ovary Measurements: 4.2 x 1.7 x 2.4 cm. Normal appearance/no adnexal mass. Left ovary Measurements: 3.7 x 1.8 x 1.9 cm. Normal appearance/no adnexal mass. Pulsed Doppler evaluation of both ovaries demonstrates normal low-resistance arterial and venous waveforms. Other findings Small volume free physiologic fluid within the pelvis. IMPRESSION: Negative pelvic ultrasound.  No acute abnormality identified. Electronically Signed   By: Rise Mu M.D.   On: 01/28/2018 18:30   [place] Art/ven Flow Abd Pelv Doppler  Result Date: 01/28/2018 CLINICAL DATA:  Initial evaluation for acute lower abdominal pain, pelvic pain, vaginal bleeding. Recent miscarriage. EXAM:  TRANSABDOMINAL AND TRANSVAGINAL ULTRASOUND OF PELVIS DOPPLER ULTRASOUND OF OVARIES TECHNIQUE: Both transabdominal and transvaginal ultrasound examinations of the pelvis were performed. Transabdominal technique was performed for global imaging of the pelvis including uterus, ovaries, adnexal regions, and pelvic cul-de-sac. It was necessary to proceed with endovaginal exam following the transabdominal exam to visualize the uterus, endometrium, and ovaries. Color and duplex Doppler ultrasound was utilized to evaluate blood flow to the ovaries. COMPARISON:  Prior ultrasound from 09/01/2017 FINDINGS: Uterus Measurements: 9.0 x 4.3 x 6.0 cm. No fibroids or other mass visualized. Septate versus bicornuate uterus noted. Endometrium Endometrial stripe measures 8.2 mm on the right and 7.1 mm on the left. No focal abnormality. Right ovary Measurements: 4.2 x 1.7  x 2.4 cm. Normal appearance/no adnexal mass. Left ovary Measurements: 3.7 x 1.8 x 1.9 cm. Normal appearance/no adnexal mass. Pulsed Doppler evaluation of both ovaries demonstrates normal low-resistance arterial and venous waveforms. Other findings Small volume free physiologic fluid within the pelvis. IMPRESSION: Negative pelvic ultrasound.  No acute abnormality identified. Electronically Signed   By: Rise Mu M.D.   On: 01/28/2018 18:30    Procedures Procedures (including critical care time)  Medications Ordered in ED Medications  sodium chloride 0.9 % bolus 1,000 mL (0 mLs Intravenous Stopped 01/28/18 1741)  morphine 4 MG/ML injection 4 mg (4 mg Intravenous Given 01/28/18 1551)  ondansetron (ZOFRAN) injection 4 mg (4 mg Intravenous Given 01/28/18 1550)  doxycycline (VIBRA-TABS) tablet 100 mg (100 mg Oral Given 01/28/18 2042)  cefTRIAXone (ROCEPHIN) injection 250 mg (250 mg Intramuscular Given 01/28/18 2042)     Initial Impression / Assessment and Plan / ED Course  I have reviewed the triage vital signs and the nursing notes.  Pertinent labs  & imaging results that were available during my care of the patient were reviewed by me and considered in my medical decision making (see chart for details).     22 y.o. female with recent history of missed abortion at 65 weeks (12/03/2017), prior STDs who presents emergency department today for abdominal pain and vaginal bleeding.  Patient reports she has been sexually active since missed abortion.  Patient is hemodynamically stable on presentation.  She is without fever, tachycardia, tachypnea, hypoxia or hypotension.  Patency test negative.  No concern for ectopic. abdominal exam is without any tenderness or peritoneal signs.  There is no right lower quadrant tenderness palpation or McBurney's point tenderness making concern for appendicitis.  Pelvic exam is with right adnexal tenderness as well as cervical motion tenderness.  There is mild bleeding in the vaginal vault.  Will obtain ultrasound to rule out torsion versus retained products versus tubo-ovarian abscess.  Will give antibiotics for PID.  UA is with evidence of UTI.  Ultrasound and lab work is otherwise reassuring.  Patient symptoms are currently controlled.  She is without nausea or vomiting in the department.  Will treat for PID with doxycyline and UTI with keflex. Zofran given for N/V. Recommended Ibuprofen for pain. Repeat abdominal exam without TTP. No peritoneal signs. Do not feel she requires a CT scan at this time. The evaluation does not show pathology that would require ongoing emergent intervention or inpatient treatment. I advised the patient to follow-up with PCP/OBGYN this week. I advised the patient to return to the emergency department with new or worsening symptoms or new concerns. Specific return precautions discussed. The patient verbalized understanding and agreement with plan. All questions answered. No further questions at this time. The patient is hemodynamically stable, mentating appropriately and appears safe for  discharge.  Final Clinical Impressions(s) / ED Diagnoses   Final diagnoses:  Vaginal bleeding  PID (acute pelvic inflammatory disease)  Cystitis    ED Discharge Orders        Ordered    ondansetron (ZOFRAN) 4 MG tablet  Every 8 hours PRN     01/28/18 2025    doxycycline (VIBRAMYCIN) 100 MG capsule  2 times daily     01/28/18 2025    cephALEXin (KEFLEX) 500 MG capsule  2 times daily     01/28/18 2025       Princella Pellegrini 01/28/18 2054    Linwood Dibbles, MD 01/30/18 1032

## 2018-01-28 NOTE — ED Notes (Signed)
Bed: WA01 Expected date:  Expected time:  Means of arrival:  Comments: For triage 2

## 2018-01-28 NOTE — Discharge Instructions (Addendum)
Your workup today was reassuring. I suspect your symptoms are related to PID and a UTI. Please see attached handouts.  Please take medications as prescribed.  Please follow up with your OBGYN/PCP this week for re-evaluation.  If you develop fever, pain in your right lower abdomen, right upper abdomen, continued nausea and vomiting despite Zofran therapy, please return for further treatment.  If you develop worsening or new concerning symptoms you can return to the emergency department for re-evaluation.

## 2018-01-29 LAB — HIV ANTIBODY (ROUTINE TESTING W REFLEX): HIV SCREEN 4TH GENERATION: NONREACTIVE

## 2018-01-29 LAB — RPR: RPR Ser Ql: NONREACTIVE

## 2018-01-29 LAB — GC/CHLAMYDIA PROBE AMP (~~LOC~~) NOT AT ARMC
CHLAMYDIA, DNA PROBE: NEGATIVE
Neisseria Gonorrhea: NEGATIVE

## 2018-02-10 ENCOUNTER — Encounter: Payer: BLUE CROSS/BLUE SHIELD | Admitting: Women's Health

## 2018-06-30 ENCOUNTER — Encounter (HOSPITAL_COMMUNITY): Payer: Self-pay | Admitting: Obstetrics and Gynecology

## 2018-06-30 ENCOUNTER — Emergency Department (HOSPITAL_COMMUNITY)
Admission: EM | Admit: 2018-06-30 | Discharge: 2018-06-30 | Disposition: A | Discharge status: Home / Self Care | Payer: BLUE CROSS/BLUE SHIELD | Attending: Emergency Medicine | Admitting: Emergency Medicine

## 2018-06-30 ENCOUNTER — Other Ambulatory Visit: Payer: Self-pay

## 2018-06-30 DIAGNOSIS — R112 Nausea with vomiting, unspecified: Secondary | ICD-10-CM | POA: Diagnosis not present

## 2018-06-30 DIAGNOSIS — Z5321 Procedure and treatment not carried out due to patient leaving prior to being seen by health care provider: Secondary | ICD-10-CM | POA: Insufficient documentation

## 2018-06-30 LAB — COMPREHENSIVE METABOLIC PANEL
ALT: 25 U/L (ref 0–44)
AST: 31 U/L (ref 15–41)
Albumin: 4.9 g/dL (ref 3.5–5.0)
Alkaline Phosphatase: 48 U/L (ref 38–126)
Anion gap: 12 (ref 5–15)
BUN: 10 mg/dL (ref 6–20)
CHLORIDE: 108 mmol/L (ref 98–111)
CO2: 24 mmol/L (ref 22–32)
CREATININE: 0.78 mg/dL (ref 0.44–1.00)
Calcium: 9.9 mg/dL (ref 8.9–10.3)
GFR calc non Af Amer: >60 mL/min (ref >60)
Glucose, Bld: 105 mg/dL — ABNORMAL HIGH (ref 70–99)
Potassium: 4.4 mmol/L (ref 3.5–5.1)
SODIUM: 144 mmol/L (ref 135–145)
Total Bilirubin: 1 mg/dL (ref 0.3–1.2)
Total Protein: 8.5 g/dL — ABNORMAL HIGH (ref 6.5–8.1)

## 2018-06-30 LAB — CBC
HCT: 43.3 % (ref 36.0–46.0)
HEMOGLOBIN: 14 g/dL (ref 12.0–15.0)
MCH: 29.4 pg (ref 26.0–34.0)
MCHC: 32.3 g/dL (ref 30.0–36.0)
MCV: 91 fL (ref 80.0–100.0)
NRBC: 0 % (ref 0.0–0.2)
Platelets: 173 10*3/uL (ref 150–400)
RBC: 4.76 MIL/uL (ref 3.87–5.11)
RDW: 12.5 % (ref 11.5–15.5)
WBC: 6.4 10*3/uL (ref 4.0–10.5)

## 2018-06-30 LAB — LIPASE, BLOOD: LIPASE: 27 U/L (ref 11–51)

## 2018-06-30 LAB — I-STAT BETA HCG BLOOD, ED (MC, WL, AP ONLY): I-stat hCG, quantitative: <5 m[IU]/mL (ref <5)

## 2018-06-30 MED ORDER — ONDANSETRON 4 MG PO TBDP
4.0000 mg | ORAL_TABLET | Freq: Once | ORAL | Status: AC | PRN
  Administered 2018-06-30: 4 mg via ORAL
  Filled 2018-06-30: qty 1

## 2018-06-30 NOTE — ED Notes (Signed)
PT STATED SHE FELT BETTER AFTER RECEIVING ZOFRAN AND WANTED TO LEAVE RN KAITLYN INFORMED

## 2018-06-30 NOTE — ED Triage Notes (Signed)
Pt reports nausea, emesis, body aches. Pt denies sore throat and earaches.  Pt denies pregnancy and reports she recently had her period.  Pt reports she has been emesis since 11 this morning.  Pt reports she did not eat anything out of the ordinary yesterday.

## 2018-06-30 NOTE — ED Notes (Signed)
Pt sts she is unable to give a urine sample at this time 

## 2018-12-24 ENCOUNTER — Telehealth: Payer: Self-pay | Admitting: *Deleted

## 2018-12-24 NOTE — Telephone Encounter (Signed)
Mom is calling to see if the patient can have her physical on a Friday because she works 3 rd shift and her day off is Friday?

## 2018-12-29 NOTE — Telephone Encounter (Signed)
Spoke to [place] and pt now scheduled for 02/26/2019.

## 2018-12-29 NOTE — Telephone Encounter (Signed)
That is fine 

## 2019-02-26 ENCOUNTER — Other Ambulatory Visit: Payer: Self-pay

## 2019-02-26 ENCOUNTER — Encounter: Payer: BC Managed Care – PPO | Admitting: Adult Health

## 2019-02-26 ENCOUNTER — Emergency Department (HOSPITAL_COMMUNITY)
Admission: EM | Admit: 2019-02-26 | Discharge: 2019-02-26 | Disposition: A | Discharge status: Home / Self Care | Payer: BC Managed Care – PPO | Attending: Emergency Medicine | Admitting: Emergency Medicine

## 2019-02-26 ENCOUNTER — Encounter: Payer: Self-pay | Admitting: Adult Health

## 2019-02-26 ENCOUNTER — Encounter (HOSPITAL_COMMUNITY): Payer: Self-pay

## 2019-02-26 ENCOUNTER — Emergency Department (HOSPITAL_COMMUNITY): Payer: BC Managed Care – PPO

## 2019-02-26 DIAGNOSIS — Z79899 Other long term (current) drug therapy: Secondary | ICD-10-CM | POA: Insufficient documentation

## 2019-02-26 DIAGNOSIS — F17290 Nicotine dependence, other tobacco product, uncomplicated: Secondary | ICD-10-CM | POA: Insufficient documentation

## 2019-02-26 DIAGNOSIS — N939 Abnormal uterine and vaginal bleeding, unspecified: Secondary | ICD-10-CM | POA: Diagnosis not present

## 2019-02-26 DIAGNOSIS — R102 Pelvic and perineal pain: Secondary | ICD-10-CM | POA: Insufficient documentation

## 2019-02-26 LAB — CBC WITH DIFFERENTIAL/PLATELET
Abs Immature Granulocytes: 0.01 10*3/uL (ref 0.00–0.07)
Basophils Absolute: 0 10*3/uL (ref 0.0–0.1)
Basophils Relative: 0 %
Eosinophils Absolute: 0 10*3/uL (ref 0.0–0.5)
Eosinophils Relative: 1 %
HCT: 39.8 % (ref 36.0–46.0)
Hemoglobin: 12.5 g/dL (ref 12.0–15.0)
Immature Granulocytes: 0 %
Lymphocytes Relative: 28 %
Lymphs Abs: 1.1 10*3/uL (ref 0.7–4.0)
MCH: 28.7 pg (ref 26.0–34.0)
MCHC: 31.4 g/dL (ref 30.0–36.0)
MCV: 91.5 fL (ref 80.0–100.0)
Monocytes Absolute: 0.5 10*3/uL (ref 0.1–1.0)
Monocytes Relative: 13 %
Neutro Abs: 2.3 10*3/uL (ref 1.7–7.7)
Neutrophils Relative %: 58 %
Platelets: 178 10*3/uL (ref 150–400)
RBC: 4.35 MIL/uL (ref 3.87–5.11)
RDW: 13.2 % (ref 11.5–15.5)
WBC: 4 10*3/uL (ref 4.0–10.5)
nRBC: 0 % (ref 0.0–0.2)

## 2019-02-26 LAB — WET PREP, GENITAL
Clue Cells Wet Prep HPF POC: NONE SEEN
Sperm: NONE SEEN
Trich, Wet Prep: NONE SEEN
Yeast Wet Prep HPF POC: NONE SEEN

## 2019-02-26 LAB — URINALYSIS, ROUTINE W REFLEX MICROSCOPIC
Bilirubin Urine: NEGATIVE
Glucose, UA: NEGATIVE mg/dL
Ketones, ur: NEGATIVE mg/dL
Leukocytes,Ua: NEGATIVE
Nitrite: NEGATIVE
Protein, ur: NEGATIVE mg/dL
RBC / HPF: >50 RBC/hpf — ABNORMAL HIGH (ref 0–5)
Specific Gravity, Urine: 1.017 (ref 1.005–1.030)
pH: 6 (ref 5.0–8.0)

## 2019-02-26 LAB — BASIC METABOLIC PANEL
Anion gap: 7 (ref 5–15)
BUN: 12 mg/dL (ref 6–20)
CO2: 28 mmol/L (ref 22–32)
Calcium: 9.4 mg/dL (ref 8.9–10.3)
Chloride: 105 mmol/L (ref 98–111)
Creatinine, Ser: 0.83 mg/dL (ref 0.44–1.00)
GFR calc Af Amer: >60 mL/min (ref >60)
GFR calc non Af Amer: >60 mL/min (ref >60)
Glucose, Bld: 59 mg/dL — ABNORMAL LOW (ref 70–99)
Potassium: 3.9 mmol/L (ref 3.5–5.1)
Sodium: 140 mmol/L (ref 135–145)

## 2019-02-26 LAB — I-STAT BETA HCG BLOOD, ED (MC, WL, AP ONLY): I-stat hCG, quantitative: <5 m[IU]/mL (ref <5)

## 2019-02-26 MED ORDER — HYDROCODONE-ACETAMINOPHEN 5-325 MG PO TABS
1.0000 | ORAL_TABLET | Freq: Once | ORAL | Status: AC
  Administered 2019-02-26: 1 via ORAL
  Filled 2019-02-26: qty 1

## 2019-02-26 NOTE — Progress Notes (Deleted)
Subjective:    Patient ID: Veronica Mclean, female    DOB: 09/29/1995, 23 y.o.   MRN: [id number]  HPI Patient presents for yearly preventative medicine examination. She is a pleasant 23 year old female who  has a past medical history of Chlamydia (09/2010, 01/2011, 12/2014), Gonorrhea (09/2010, 12/2014), and HSV infection.    All immunizations and health maintenance protocols were reviewed with the patient and needed orders were placed.  Appropriate screening laboratory values were ordered for the patient including screening of hyperlipidemia, renal function and hepatic function. If indicated by BPH, a PSA was ordered.  Medication reconciliation,  past medical history, social history, problem list and allergies were reviewed in detail with the patient  Goals were established with regard to weight loss, exercise, and  diet in compliance with medications  She is seen by GYN and is up to date on her GYN exams    Review of Systems  Constitutional: Negative.   HENT: Negative.   Eyes: Negative.   Respiratory: Negative.   Cardiovascular: Negative.   Gastrointestinal: Negative.   Endocrine: Negative.   Genitourinary: Negative.   Musculoskeletal: Negative.   Skin: Negative.   Allergic/Immunologic: Negative.   Neurological: Negative.   Hematological: Negative.   Psychiatric/Behavioral: Negative.    Past Medical History:  Diagnosis Date  . Chlamydia 09/2010, 01/2011, 12/2014  . Gonorrhea 09/2010, 12/2014  . HSV infection     Social History   Socioeconomic History  . Marital status: Single    Spouse name: Not on file  . Number of children: Not on file  . Years of education: Not on file  . Highest education level: Not on file  Occupational History  . Not on file  Social Needs  . Financial resource strain: Not on file  . Food insecurity    Worry: Not on file    Inability: Not on file  . Transportation needs    Medical: Not on file    Non-medical: Not on file  Tobacco Use   . Smoking status: Current Every Day Smoker    Types: Cigars    Start date: 07/29/2017  . Smokeless tobacco: Never Used  Substance and Sexual Activity  . Alcohol use: Yes    Alcohol/week: 0.0 standard drinks    Frequency: Never    Comment: Social  . Drug use: Yes    Types: Marijuana  . Sexual activity: Yes  Lifestyle  . Physical activity    Days per week: Not on file    Minutes per session: Not on file  . Stress: Not on file  Relationships  . Social Musicianconnections    Talks on phone: Not on file    Gets together: Not on file    Attends religious service: Not on file    Active member of club or organization: Not on file    Attends meetings of clubs or organizations: Not on file    Relationship status: Not on file  . Intimate partner violence    Fear of current or ex partner: Not on file    Emotionally abused: Not on file    Physically abused: Not on file    Forced sexual activity: Not on file  Other Topics Concern  . Not on file  Social History Narrative   [employer]   Works at [employer]   Single    No Kids.       Past Surgical History:  Procedure Laterality Date  . NO PAST SURGERIES  No family history on file.  No Known Allergies  No current outpatient medications on file prior to visit.   No current facility-administered medications on file prior to visit.     There were no vitals taken for this visit.      Objective:   Physical Exam Vitals signs and nursing note reviewed.  Constitutional:      General: She is not in acute distress.    Appearance: Normal appearance. She is normal weight. She is not diaphoretic.  HENT:     Head: Normocephalic and atraumatic.     Right Ear: Tympanic membrane, ear canal and external ear normal. There is no impacted cerumen.     Left Ear: Tympanic membrane, ear canal and external ear normal. There is no impacted cerumen.     Nose: Nose normal. No congestion or rhinorrhea.     Mouth/Throat:     Mouth: Mucous  membranes are moist.     Pharynx: Oropharynx is clear. No oropharyngeal exudate or posterior oropharyngeal erythema.  Eyes:     General: No scleral icterus.       Right eye: No discharge.        Left eye: No discharge.     Conjunctiva/sclera: Conjunctivae normal.     Pupils: Pupils are equal, round, and reactive to light.  Neck:     Musculoskeletal: Normal range of motion and neck supple.     Thyroid: No thyromegaly.     Vascular: No JVD.     Trachea: No tracheal deviation.  Cardiovascular:     Rate and Rhythm: Normal rate and regular rhythm.     Pulses: Normal pulses.     Heart sounds: Normal heart sounds. No murmur. No friction rub. No gallop.   Pulmonary:     Effort: Pulmonary effort is normal. No respiratory distress.     Breath sounds: Normal breath sounds. No stridor. No wheezing, rhonchi or rales.  Chest:     Chest wall: No tenderness.  Abdominal:     General: Bowel sounds are normal. There is no distension.     Palpations: Abdomen is soft. There is no mass.     Tenderness: There is no abdominal tenderness. There is no right CVA tenderness, left CVA tenderness, guarding or rebound.     Hernia: No hernia is present.  Musculoskeletal: Normal range of motion.        General: No swelling, tenderness, deformity or signs of injury.     Right lower leg: No edema.     Left lower leg: No edema.  Lymphadenopathy:     Cervical: No cervical adenopathy.  Skin:    General: Skin is warm and dry.     Capillary Refill: Capillary refill takes less than 2 seconds.     Coloration: Skin is not jaundiced or pale.     Findings: No bruising, erythema, lesion or rash.  Neurological:     General: No focal deficit present.     Mental Status: She is alert and oriented to person, place, and time. Mental status is at baseline.     Cranial Nerves: No cranial nerve deficit.     Sensory: No sensory deficit.     Motor: No weakness or abnormal muscle tone.     Coordination: Coordination normal.      Gait: Gait normal.     Deep Tendon Reflexes: Reflexes normal.  Psychiatric:        Mood and Affect: Mood normal.  Thought Content: Thought content normal.        Judgment: Judgment normal.        Assessment & Plan:

## 2019-02-26 NOTE — Discharge Instructions (Signed)
Your blood work and ultrasound today were reassuring.   You can take 1 to 2 tablets of Tylenol (350mg -1000mg  depending on the dose) every 6 hours as needed for pain.  Do not exceed 4000 mg of Tylenol daily.  If your pain persists you can take a doses of ibuprofen in between doses of Tylenol.  I usually recommend 400 to 600 mg of ibuprofen every 6 hours.  Take this with food to avoid upset stomach issues.   Drink plenty fluids and get plenty of rest.  Follow-up with a an OB/GYN for reevaluation of your vaginal bleeding and pelvic pain.  Return to the emergency department if any concerning signs or symptoms develop such as high fevers, worsening pain, or persistent vomiting.

## 2019-02-26 NOTE — ED Notes (Signed)
Visitor at bedside.

## 2019-02-26 NOTE — ED Notes (Signed)
US at bedside

## 2019-02-26 NOTE — ED Triage Notes (Signed)
Patient reports her period was last month and lasted 1 week and usually period is 3 days. Vaginal bleeding then came back and patient reports last night "something" came out of her and not sure if it was a blood clot. Patient is now having heavy bleeding.   C/o cramping in abdomen/pelvic area.  Patient uses the term "pulling"   7/10  Patient states last year she had a still born.    A/Ox4 Ambulatory in triage.

## 2019-02-26 NOTE — ED Notes (Signed)
This nurse and PA at bedside for pelvic exam

## 2019-02-26 NOTE — Progress Notes (Signed)
Patient showed up today for her physical. While in the room she mentioned that yesterday she was having severe cramping and passed a " large mass". She has had heavy vaginal bleeding since then with continued cramping.   She reports that her periods have been fairly normal and did not know if she was pregnant.    Spoke to her GYN and they advised that she go to the ER for further evaluation. Patient tearful in the office. She agrees to go to the ER   Will reschedule her CPE

## 2019-02-26 NOTE — ED Notes (Addendum)
Pt d/c home per MD order. Discharge summary reviewed, pt verbalizes understanding. Ambulatory of unit with mother. No s/s of acute distress noted.

## 2019-02-26 NOTE — ED Provider Notes (Signed)
[facility]-EMERGENCY DEPT Provider Note   CSN: [id number] Arrival date & time: 02/26/19  1038    History   Chief Complaint Chief Complaint  Patient presents with   Vaginal Bleeding    HPI Veronica Mclean is a 23 y.o.  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  She reports that she got her period 3 days later than expected last month on 02/11/2019 which lasted for a week which is longer than usual for her.  Was also little bit heavier than usual.  She reports she did not have any bleeding for a few days and then began to have light vaginal bleeding around 4 days ago.  She has had some intermittent lower abdominal cramping which has been worsening and yesterday it became severe while she was at work.  She reports that she went to the bathroom and passed a large piece of tissue or blood clot about the size of both of her palms but together.  Since then her bleeding has worsened.  She feels a "pulling" sensation to the pelvis.  Worsened sitting upright, improves laying flat.  Denies vomiting but has had some nausea.  No fever, chest pain, shortness of breath, urinary symptoms, diarrhea, constipation, or abnormal vaginal discharge otherwise.     The history is provided by the patient.    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 Authorizing Provider  Melatonin 10 MG TABS Take 10 mg by mouth at bedtime as needed (sleep).   Yes [provider]    Family History History reviewed. No pertinent family history.  Social History Social History   Tobacco Use   Smoking status: Current Every Day Smoker    Types: Cigars    Start date: 07/29/2017   Smokeless tobacco: Never Used  Substance Use Topics   Alcohol use: Yes    Alcohol/week: 0.0 standard drinks    Frequency: Never    Comment: Social   Drug use: Yes    Types: Marijuana     Allergies   Patient has no known allergies.   Review of Systems Review of Systems  Constitutional: Negative for chills and fever.  Respiratory: Negative for shortness of breath and stridor.   Cardiovascular: Negative for chest pain.  Gastrointestinal: Positive for nausea. Negative for vomiting.  Genitourinary: Positive for pelvic pain and vaginal bleeding.  Neurological: Negative for syncope and light-headedness.  All other systems reviewed and are negative.  Physical Exam Updated Vital Signs BP 113/66    Pulse 62    Temp 98.1 F (36.7 C) (Oral)    Resp 16    SpO2 99%   Physical Exam Vitals signs and nursing note reviewed. Exam conducted with a chaperone present.  Constitutional:      General: She is not in acute distress.    Appearance: She is well-developed.  HENT:     Head: Normocephalic and atraumatic.  Eyes:     General:        Right eye: No discharge.        Left eye: No discharge.     Conjunctiva/sclera: Conjunctivae normal.  Neck:     Vascular: No JVD.     Trachea: No tracheal deviation.  Cardiovascular:     Rate and Rhythm: Normal rate and regular rhythm.  Pulmonary:     Effort: Pulmonary effort is normal.     Breath sounds: Normal breath sounds.  Abdominal:     General: Abdomen is flat. Bowel sounds are normal.  There is no distension.     Palpations: Abdomen is soft.     Tenderness: There is no abdominal tenderness. There is no right CVA tenderness, left CVA tenderness, guarding or rebound.  Genitourinary:    Vagina: No signs of injury. Bleeding present. No vaginal discharge.     Adnexa: Right adnexa normal and left adnexa normal.       Right: No mass or tenderness.         Left: No mass or tenderness.       Comments: Examination performed in the presence of a chaperone.  No masses or lesions to the external genitalia.  Moderate amount of blood in the vaginal vault limiting examination of the cervix and cervical os.  No cervical motion tenderness or adnexal tenderness. Skin:    General: Skin is warm and dry.     Findings: No erythema.  Neurological:     Mental Status: She is alert.  Psychiatric:        Mood and Affect: Mood is anxious.        Behavior: Behavior normal.      ED Treatments / Results  Labs (all labs ordered are listed, but only abnormal results are displayed) Labs Reviewed  WET PREP, GENITAL - Abnormal; Notable for the following components:      Result Value   WBC, Wet Prep HPF POC FEW (*)    All other components within normal limits  BASIC METABOLIC PANEL - Abnormal; Notable for the following components:   Glucose, Bld 59 (*)    All other components within normal limits  URINALYSIS, ROUTINE W REFLEX MICROSCOPIC - Abnormal; Notable for the following components:   Hgb urine dipstick LARGE (*)    RBC / HPF >50 (*)    Bacteria, UA RARE (*)    All other components within normal limits  CBC WITH DIFFERENTIAL/PLATELET  RPR  HIV ANTIBODY (ROUTINE TESTING W REFLEX)  I-STAT BETA HCG BLOOD, ED ([facility], [facility], [facility] ONLY)  GC/CHLAMYDIA PROBE AMP () NOT AT [facility]    EKG None  Radiology [place] Transvaginal Non-ob  Result Date: 02/26/2019 CLINICAL DATA:  Vaginal bleeding and pelvic pain for 5 days EXAM: TRANSABDOMINAL AND TRANSVAGINAL ULTRASOUND OF PELVIS DOPPLER ULTRASOUND OF  OVARIES TECHNIQUE: Both transabdominal and transvaginal ultrasound examinations of the pelvis were performed. Transabdominal technique was performed for global imaging of the pelvis including uterus, ovaries, adnexal regions, and pelvic cul-de-sac. It was necessary to  proceed with endovaginal exam following the transabdominal exam to visualize the endometrium and ovaries. Color and duplex Doppler ultrasound was utilized to evaluate blood flow to the ovaries. Patient had a difficult time tolerating transvaginal imaging due to pain. COMPARISON:  01/28/2018 FINDINGS: Uterus Measurements: 7.9 x 3.3 x 5.3 cm = volume: 72 mL. Septate versus bicornuate uterus. Normal morphology without mass Endometrium Thickness: 1.4 mm RIGHT, 1 mm LEFT. No endometrial fluid or mass lesion Right ovary Measurements: 4.8 x 2.9 x 2.1 cm = volume: 15.1 mL. Normal morphology without mass. Blood flow present within RIGHT ovary on color Doppler imaging. Left ovary Measurements: 3.3 x 1.9 x 1.9 cm = volume: 6.4 mL. Normal morphology without mass. Blood flow present within LEFT ovary on color Doppler imaging. Pulsed Doppler evaluation of both ovaries demonstrates normal low-resistance arterial and venous waveforms. Other findings Trace free pelvic fluid, potentially physiologic. No adnexal masses. IMPRESSION: Septate versus bicornuate uterus. Otherwise normal exam. No evidence of ovarian mass or torsion. Electronically Signed   By: Lavonia Dana M.D.   On: 02/26/2019 13:33   US Pelvis Complete  Result Date: 02/26/2019 CLINICAL DATA:  Vaginal bleeding and pelvic pain for 5 days EXAM: TRANSABDOMINAL AND TRANSVAGINAL ULTRASOUND OF PELVIS DOPPLER ULTRASOUND OF OVARIES TECHNIQUE: Both transabdominal and transvaginal ultrasound examinations of the pelvis were performed. Transabdominal technique was performed for global imaging of the pelvis including uterus, ovaries, adnexal regions, and pelvic cul-de-sac. It was necessary to proceed with endovaginal  exam following the transabdominal exam to visualize the endometrium and ovaries. Color and duplex Doppler ultrasound was utilized to evaluate blood flow to the ovaries. Patient had a difficult time tolerating transvaginal imaging due to pain. COMPARISON:  01/28/2018 FINDINGS: Uterus Measurements: 7.9 x 3.3 x 5.3 cm = volume: 72 mL. Septate versus bicornuate uterus. Normal morphology without mass Endometrium Thickness: 1.4 mm RIGHT, 1 mm LEFT. No endometrial fluid or mass lesion Right ovary Measurements: 4.8 x 2.9 x 2.1 cm = volume: 15.1 mL. Normal morphology without mass. Blood flow present within RIGHT ovary on color Doppler imaging. Left ovary Measurements: 3.3 x 1.9 x 1.9 cm = volume: 6.4 mL. Normal morphology without mass. Blood flow present within LEFT ovary on color Doppler imaging. Pulsed Doppler evaluation of both ovaries demonstrates normal low-resistance arterial and venous waveforms. Other findings Trace free pelvic fluid, potentially physiologic. No adnexal masses. IMPRESSION: Septate versus bicornuate uterus. Otherwise normal exam. No evidence of ovarian mass or torsion. Electronically Signed   By: Lavonia Dana M.D.   On: 02/26/2019 13:33   [place] Art/ven Flow Abd Pelv Doppler  Result Date: 02/26/2019 CLINICAL DATA:  Vaginal bleeding and pelvic pain for 5 days EXAM: TRANSABDOMINAL AND TRANSVAGINAL ULTRASOUND OF PELVIS DOPPLER ULTRASOUND OF OVARIES TECHNIQUE: Both transabdominal and transvaginal ultrasound examinations of the pelvis were performed. Transabdominal technique was performed for global imaging of the pelvis including uterus, ovaries, adnexal regions, and pelvic cul-de-sac. It was necessary to proceed with endovaginal exam following the transabdominal exam to visualize the endometrium and ovaries. Color and duplex Doppler ultrasound was utilized to evaluate blood flow to the ovaries. Patient had a difficult time tolerating transvaginal imaging due to pain. COMPARISON:  01/28/2018 FINDINGS:  Uterus Measurements: 7.9 x 3.3 x 5.3 cm = volume: 72 mL. Septate versus bicornuate uterus. Normal morphology without mass Endometrium Thickness: 1.4 mm RIGHT, 1 mm LEFT. No endometrial fluid or mass lesion Right ovary Measurements: 4.8 x 2.9 x 2.1 cm = volume: 15.1 mL. Normal morphology without mass. Blood flow present  within RIGHT ovary on color Doppler imaging. Left ovary Measurements: 3.3 x 1.9 x 1.9 cm = volume: 6.4 mL. Normal morphology without mass. Blood flow present within LEFT ovary on color Doppler imaging. Pulsed Doppler evaluation of both ovaries demonstrates normal low-resistance arterial and venous waveforms. Other findings Trace free pelvic fluid, potentially physiologic. No adnexal masses. IMPRESSION: Septate versus bicornuate uterus. Otherwise normal exam. No evidence of ovarian mass or torsion. Electronically Signed   By: Ulyses SouthwardMark  Boles M.D.   On: 02/26/2019 13:33    Procedures Procedures (including critical care time)  Medications Ordered in ED Medications  HYDROcodone-acetaminophen (NORCO/VICODIN) 5-325 MG per tablet 1 tablet (1 tablet Oral Given 02/26/19 1348)     Initial Impression / Assessment and Plan / ED Course  I have reviewed the triage vital signs and the nursing notes.  Pertinent labs & imaging results that were available during my care of the patient were reviewed by me and considered in my medical decision making (see chart for details).        Patient presenting for evaluation of progressively worsening abnormal vaginal bleeding and lower pelvic pain.  She is afebrile, initially hypertensive with complete resolution on subsequent reevaluation's.  She appears anxious but overall well, nontoxic.  Her abdomen is soft and nontender.  No peritoneal signs.  Labs reviewed by me show no leukocytosis, no anemia, no metabolic derangements, no renal insufficiency.  UA does not suggest UTI.  Pelvic examination somewhat limited due to her vaginal bleeding and cervix was not well  visualized but she has no cervical motion tenderness or adnexal tenderness to suggest PID.  Pelvic ultrasound shows a septate versus bicornate uterus, otherwise no evidence of ovarian mass or torsion, no evidence of TOA or ectopic pregnancy.  Given reassuring abdominal examination I doubt acute surgical abdominal pathology including obstruction, perforation, appendicitis, or cholecystitis.  Pain was managed in the ED and on reevaluation she is resting comfortably.  She expresses concern that she may have had a miscarriage and may have difficulty getting pregnant in the future as she did have a miscarriage sometime last year.  I explained to her that this further work-up should happen with an OB/GYN but that there was no indication for retained products of conception, molar pregnancy, pelvic infection, or indications for admission to the hospital.  She remains hemodynamically stable and in no distress.  Recommend follow-up with OB/GYN for reevaluation of symptoms.  Discussed strict ED return precautions.  Patient and mother verbalized understanding of and agreement with plan and patient stable for discharge home at this time.  Final Clinical Impressions(s) / ED Diagnoses   Final diagnoses:  Abnormal uterine bleeding    ED Discharge Orders    None       Bennye AlmFawze, Karma Hiney A, PA-C 02/26/19 1436    Raeford RazorKohut, Stephen, MD 02/27/19 [phone]

## 2019-02-27 LAB — GC/CHLAMYDIA PROBE AMP (~~LOC~~) NOT AT ARMC
Chlamydia: POSITIVE — AB
Neisseria Gonorrhea: NEGATIVE

## 2019-02-27 LAB — HIV ANTIBODY (ROUTINE TESTING W REFLEX): HIV Screen 4th Generation wRfx: NONREACTIVE

## 2019-02-27 LAB — RPR: RPR Ser Ql: NONREACTIVE

## 2019-03-01 ENCOUNTER — Encounter: Payer: Self-pay | Admitting: Women's Health

## 2019-03-01 ENCOUNTER — Other Ambulatory Visit: Payer: Self-pay

## 2019-03-01 ENCOUNTER — Ambulatory Visit (INDEPENDENT_AMBULATORY_CARE_PROVIDER_SITE_OTHER): Payer: BLUE CROSS/BLUE SHIELD | Admitting: Women's Health

## 2019-03-01 DIAGNOSIS — R350 Frequency of micturition: Secondary | ICD-10-CM | POA: Diagnosis not present

## 2019-03-01 DIAGNOSIS — Q513 Bicornate uterus: Secondary | ICD-10-CM

## 2019-03-01 DIAGNOSIS — A599 Trichomoniasis, unspecified: Secondary | ICD-10-CM | POA: Diagnosis not present

## 2019-03-01 DIAGNOSIS — A749 Chlamydial infection, unspecified: Secondary | ICD-10-CM | POA: Diagnosis not present

## 2019-03-01 MED ORDER — AZITHROMYCIN 500 MG PO TABS: 1000.0000 mg | ORAL_TABLET | Freq: Every day | ORAL | 0 refills | Status: DC

## 2019-03-01 MED ORDER — METRONIDAZOLE 500 MG PO TABS: ORAL_TABLET | ORAL | 1 refills | Status: DC

## 2019-03-01 NOTE — Patient Instructions (Addendum)
Trichomoniasis Trichomoniasis is an STI (sexually transmitted infection) that can affect both women and men. In women, the outer area of the female genitalia (vulva) and the vagina are affected. In men, mainly the penis is affected, but the prostate and other reproductive organs can also be involved.  This condition can be treated with medicine. It often has no symptoms (is asymptomatic), especially in men. If not treated, trichomoniasis can last for months or years. What are the causes? This condition is caused by a parasite called Trichomonas vaginalis. Trichomoniasis most often spreads from person to person (is contagious) through sexual contact. What increases the risk? The following factors may make you more likely to develop this condition:  Having unprotected sex.  Having sex with a partner who has trichomoniasis.  Having multiple sexual partners.  Having had previous trichomoniasis infections or other STIs. What are the signs or symptoms? In women, symptoms of trichomoniasis include:  Abnormal vaginal discharge that is clear, white, gray, or yellow-green and foamy and has an unusual "fishy" odor.  Itching and irritation of the vagina and vulva.  Burning or pain during urination or sex.  Redness and swelling of the genitals. In men, symptoms of trichomoniasis include:  Penile discharge that may be foamy or contain pus.  Pain in the penis. This may happen only when urinating.  Itching or irritation inside the penis.  Burning after urination or ejaculation. How is this diagnosed? In women, this condition may be found during a routine Pap test or physical exam. It may be found in men during a routine physical exam. Your health care provider may do tests to help diagnose this infection, such as:  Urine tests (men and women).  The following in women: ? Testing the pH of the vagina. ? A vaginal swab test that checks for the Trichomonas vaginalis parasite. ? Testing vaginal  secretions. Your health care provider may test you for other STIs, including HIV (human immunodeficiency virus). How is this treated? This condition is treated with medicine taken by mouth (orally), such as metronidazole or tinidazole, to fight the infection. Your sexual partner(s) also need to be tested and treated.  If you are a woman and you plan to become pregnant or think you may be pregnant, tell your health care provider right away. Some medicines that are used to treat the infection should not be taken during pregnancy. Your health care provider may recommend over-the-counter medicines or creams to help relieve itching or irritation. You may be tested for infection again 3 months after treatment. Follow these instructions at home:  Take and use over-the-counter and prescription medicines, including creams, only as told by your health care provider.  Take your antibiotic medicine as told by your health care provider. Do not stop taking the antibiotic even if you start to feel better.  Do not have sex until 7-10 days after you finish your medicine, or until your health care provider approves. Ask your health care provider when you may start to have sex again.  (Women) Do not douche or wear tampons while you have the infection.  Discuss your infection with your sexual partner(s). Make sure that your partner gets tested and treated, if necessary.  Keep all follow-up visits as told by your health care provider. This is important. How is this prevented?   Use condoms every time you have sex. Using condoms correctly and consistently can help protect against STIs.  Avoid having multiple sexual partners.  Talk with your sexual partner about any   symptoms that either of you may have, as well as any history of STIs.  Get tested for STIs and STDs (sexually transmitted diseases) before you have sex. Ask your partner to do the same.  Do not have sexual contact if you have symptoms of  trichomoniasis or another STI. Contact a health care provider if:  You still have symptoms after you finish your medicine.  You develop pain in your abdomen.  You have pain when you urinate.  You have bleeding after sex.  You develop a rash.  You feel nauseous or you vomit.  You plan to become pregnant or think you may be pregnant. Summary  Trichomoniasis is an STI (sexually transmitted infection) that can affect both women and men.  This condition often has no symptoms (is asymptomatic), especially in men.  Without treatment, this condition can last for months or years.  You should not have sex until 7-10 days after you finish your medicine, or until your health care provider approves. Ask your health care provider when you may start to have sex again.  Discuss your infection with your sexual partner(s). Make sure that your partner gets tested and treated, if necessary. This information is not intended to replace advice given to you by your health care provider. Make sure you discuss any questions you have with your health care provider. Document Released: 01/01/2001 Document Revised: 04/21/2018 Document Reviewed: 04/21/2018 Elsevier Patient Education  [place].  Chlamydia, Female Chlamydia is an STD (sexually transmitted disease). It is a bacterial infection that spreads (is contagious) through sexual contact. Chlamydia can occur in different areas of the body, including:  The tube that moves urine from the bladder out of the body (urethra).  The lower part of the uterus (cervix).  The throat.  The rectum. This condition is not difficult to treat. However, if left untreated, chlamydia can lead to more serious health problems, including pelvic inflammatory disorder (PID). PID can increase your risk of not being able to have children (sterility). Also, if chlamydia is left untreated and you are pregnant or become pregnant, there is a chance that your baby can become  infected during delivery. This may cause serious health problems for the baby. What are the causes? Chlamydia is caused by the bacteria Chlamydia trachomatis. It is passed from an infected partner during sexual activity. Chlamydia can spread through contact with the genitals, mouth, or rectum. What are the signs or symptoms? In some cases, there may not be any symptoms for this condition (asymptomatic), especially early in the infection. If symptoms develop, they may include:  Burning with urination.  Frequent urination.  Vaginal discharge.  Redness, soreness, and swelling (inflammation) of the rectum.  Bleeding or discharge from the rectum.  Abdominal pain.  Pain during sexual intercourse.  Bleeding between menstrual periods.  Itching, burning, or redness in the eyes, or discharge from the eyes. How is this diagnosed? This condition may be diagnosed with:  Urine tests.  Swab tests. Depending on your symptoms, your health care provider may use a cotton swab to collect discharge from your vagina or rectum to test for the bacteria.  A pelvic exam. How is this treated? This condition is treated with antibiotic medicines. If you are pregnant, certain types of antibiotics will need to be avoided. Follow these instructions at home: Medicines  Take over-the-counter and prescription medicines only as told by your health care provider.  Take your antibiotic medicine as told by your health care provider. Do not stop  taking the antibiotic even if you start to feel better. Sexual activity  Tell sexual partners about your infection. This includes any oral, anal, or vaginal sex partners you have had within 60 days of when your symptoms started. Sexual partners should also be treated, even if they have no signs of the disease.  Do not have sex until you and your sexual partners have completed treatment and your health care provider says it is okay. If your health care provider prescribed  you a single dose treatment, wait 7 days after taking the treatment before having sex. General instructions  It is your responsibility to get your test results. Ask your health care provider, or the department performing the test, when your results will be ready.  Get plenty of rest.  Eat a healthy, well-balanced diet.  Drink enough fluids to keep your urine clear or pale yellow.  Keep all follow-up visits as told by your health care provider. This is important. You may need to be tested for infection again 3 months after treatment. How is this prevented? The only sure way to prevent chlamydia is to avoid having sex. However, you can lower your risk by:  Using latex condoms correctly every time you have sex.  Not having multiple sexual partners.  Asking if your sexual partner has been tested for STIs and had negative results. Contact a health care provider if:  You develop new symptoms or your symptoms do not get better after completing treatment.  You have a fever or chills.  You have pain during sexual intercourse. Get help right away if:  Your pain gets worse and does not get better with medicine.  You develop flu-like symptoms, such as night sweats, sore throat, or muscle aches.  You experience nausea or vomiting.  You have difficulty swallowing.  You have bleeding between periods or after sex.  You have irregular menstrual periods.  You have abdominal or lower back pain that does not get better with medicine.  You feel weak or dizzy, or you faint.  You are pregnant and you develop symptoms of chlamydia. Summary  Chlamydia is an STD (sexually transmitted disease). It is a bacterial infection that spreads (is contagious) through sexual contact.  This condition is not difficult to treat, however. If left untreated, chlamydia can lead to more serious health problems, including pelvic inflammatory disease (PID).  In some cases, there may not be any symptoms for  this condition (asymptomatic).  This condition is treated with antibiotic medicines.  Using latex condoms correctly every time you have sex can help prevent chlamydia. This information is not intended to replace advice given to you by your health care provider. Make sure you discuss any questions you have with your health care provider. Document Released: 04/17/2005 Document Revised: 12/30/2017 Document Reviewed: 06/24/2016 Elsevier Patient Education  2020 [employer]

## 2019-03-01 NOTE — Progress Notes (Signed)
23 year old SBF G1, P0 history of 20-week spontaneous pregnancy loss 10/2017.  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  Was seen at the ER 02/26/2019 with abdominal pain and passing a large clot.  Had a negative qualitative hCG, negative, ultrasound bicornuate versus septated uterus, normal CBC, CMP.  Was found to have positive chlamydia negative GC, HIV and RPR.  ER staff did try to inform patient unable to reach her by phone.  Continues to have some low abdominal cramping, no longer bleeding, monthly cycle, pregnancy okay condoms occasionally.  New partner.    Exam: Appears well, abdomen soft, nontender slight tenderness at suprapubic area, UA trace blood, +1 leukocytes, 10-20 WBCs, 3-10 RBCs, many bacteria, moderate trichomonas, moderate clue cells.  Trichomonas Positive chlamydia  Plan: Flagyl 2 g p.o. x1 dose, refill given for partner to take.  Zithromax 1 g p.o. x1 dose, instructed to inform partner for treatment, abstain, return to office for test of cure in 3 weeks.  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 Phineas Real, instructed to schedule after next cycle.  Encouraged condoms until permanent partner, MVI daily.  Staff message sent to Hilary Hertz RN from ER who had attempted to reach [facility] regarding positive chlamydia treatment

## 2019-03-04 LAB — URINALYSIS, COMPLETE W/RFL CULTURE
Bilirubin Urine: NEGATIVE
Glucose, UA: NEGATIVE
Hyaline Cast: NONE SEEN /LPF
Ketones, ur: NEGATIVE
Nitrites, Initial: NEGATIVE
Specific Gravity, Urine: 1.025 (ref 1.001–1.03)
pH: 6.5 (ref 5.0–8.0)

## 2019-03-04 LAB — URINE CULTURE
MICRO NUMBER:: 758361
SPECIMEN QUALITY:: ADEQUATE

## 2019-03-04 LAB — CULTURE INDICATED

## 2019-03-24 ENCOUNTER — Ambulatory Visit (INDEPENDENT_AMBULATORY_CARE_PROVIDER_SITE_OTHER): Payer: BC Managed Care – PPO | Admitting: Women's Health

## 2019-03-24 ENCOUNTER — Encounter: Payer: Self-pay | Admitting: Women's Health

## 2019-03-24 ENCOUNTER — Other Ambulatory Visit: Payer: Self-pay

## 2019-03-24 VITALS — BP 124/80

## 2019-03-24 DIAGNOSIS — N898 Other specified noninflammatory disorders of vagina: Secondary | ICD-10-CM

## 2019-03-24 LAB — WET PREP FOR TRICH, YEAST, CLUE

## 2019-03-24 MED ORDER — METRONIDAZOLE 500 MG PO TABS: 500.0000 mg | ORAL_TABLET | Freq: Two times a day (BID) | ORAL | 0 refills | Status: DC

## 2019-03-24 NOTE — Progress Notes (Signed)
23 year old SBF G1, P0 presents with complaint vaginal discharge and for test to cure chlamydia.  Treated for chlamydia 03/01/2019 partner also treated, has abstained.  LMP 02/26/2019.  Using no contraception or condoms occasionally pregnancy okay.  Negative HIV, RPR.  02/26/2019 was seen at [facility] for severe abdominal pain  and also passed a large clot, negative qualitative hCG.  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  Had been instructed to get a sonohysterogram in the past but has not done so yet.  2019  20-week spontaneous pregnancy loss.  Also asked for note for 2 days of missed work August 9 and 10.  Was seen here 03/01/2019.  Reports occasional vaginal type pain.  Denies back pain, urinary symptoms, vaginal itching or fever.  Exam: Appears well.  No CVAT.  Abdomen soft, nontender, external genitalia within normal limits, speculum exam moderate amount of adherent white discharge noted, hard prep positive for clues, TNTC bacteria.  TOC GC/chlamydia culture taken.  Bimanual no CMT or adnexal tenderness.  Bacterial vaginosis Test of cure chlamydia  Plan: Flagyl 500 twice daily for 7 days, alcohol precautions reviewed.  Test to cure chlamydia, encouraged condoms until permanent partner.  Contraception offered, declines.  Instructed to schedule annual exam.  Note written for missed work as requested.

## 2019-03-24 NOTE — Patient Instructions (Signed)
Bacterial Vaginosis  Bacterial vaginosis is a vaginal infection that occurs when the normal balance of bacteria in the vagina is disrupted. It results from an overgrowth of certain bacteria. This is the most common vaginal infection among women ages 15-44. Because bacterial vaginosis increases your risk for STIs (sexually transmitted infections), getting treated can help reduce your risk for chlamydia, gonorrhea, herpes, and HIV (human immunodeficiency virus). Treatment is also important for preventing complications in pregnant women, because this condition can cause an early (premature) delivery. What are the causes? This condition is caused by an increase in harmful bacteria that are normally present in small amounts in the vagina. However, the reason that the condition develops is not fully understood. What increases the risk? The following factors may make you more likely to develop this condition:  Having a new sexual partner or multiple sexual partners.  Having unprotected sex.  Douching.  Having an intrauterine device (IUD).  Smoking.  Drug and alcohol abuse.  Taking certain antibiotic medicines.  Being pregnant. You cannot get bacterial vaginosis from toilet seats, bedding, swimming pools, or contact with objects around you. What are the signs or symptoms? Symptoms of this condition include:  Grey or white vaginal discharge. The discharge can also be watery or foamy.  A fish-like odor with discharge, especially after sexual intercourse or during menstruation.  Itching in and around the vagina.  Burning or pain with urination. Some women with bacterial vaginosis have no signs or symptoms. How is this diagnosed? This condition is diagnosed based on:  Your medical history.  A physical exam of the vagina.  Testing a sample of vaginal fluid under a microscope to look for a large amount of bad bacteria or abnormal cells. Your health care provider may use a cotton swab or  a small wooden spatula to collect the sample. How is this treated? This condition is treated with antibiotics. These may be given as a pill, a vaginal cream, or a medicine that is put into the vagina (suppository). If the condition comes back after treatment, a second round of antibiotics may be needed. Follow these instructions at home: Medicines  Take over-the-counter and prescription medicines only as told by your health care provider.  Take or use your antibiotic as told by your health care provider. Do not stop taking or using the antibiotic even if you start to feel better. General instructions  If you have a female sexual partner, tell her that you have a vaginal infection. She should see her health care provider and be treated if she has symptoms. If you have a female sexual partner, he does not need treatment.  During treatment: ? Avoid sexual activity until you finish treatment. ? Do not douche. ? Avoid alcohol as directed by your health care provider. ? Avoid breastfeeding as directed by your health care provider.  Drink enough water and fluids to keep your urine clear or pale yellow.  Keep the area around your vagina and rectum clean. ? Wash the area daily with warm water. ? Wipe yourself from front to back after using the toilet.  Keep all follow-up visits as told by your health care provider. This is important. How is this prevented?  Do not douche.  Wash the outside of your vagina with warm water only.  Use protection when having sex. This includes latex condoms and dental dams.  Limit how many sexual partners you have. To help prevent bacterial vaginosis, it is best to have sex with just one partner (  monogamous).  Make sure you and your sexual partner are tested for STIs.  Wear cotton or cotton-lined underwear.  Avoid wearing tight pants and pantyhose, especially during summer.  Limit the amount of alcohol that you drink.  Do not use any products that contain  nicotine or tobacco, such as cigarettes and e-cigarettes. If you need help quitting, ask your health care provider.  Do not use illegal drugs. Where to find more information  Centers for Disease Control and Prevention: www.cdc.gov/std  American Sexual Health Association (ASHA): www.ashastd.org  [place] Department of Health and Human Services, Office on Women's Health: www.womenshealth.gov/ or https://www.womenshealth.gov/a-z-topics/bacterial-vaginosis Contact a health care provider if:  Your symptoms do not improve, even after treatment.  You have more discharge or pain when urinating.  You have a fever.  You have pain in your abdomen.  You have pain during sex.  You have vaginal bleeding between periods. Summary  Bacterial vaginosis is a vaginal infection that occurs when the normal balance of bacteria in the vagina is disrupted.  Because bacterial vaginosis increases your risk for STIs (sexually transmitted infections), getting treated can help reduce your risk for chlamydia, gonorrhea, herpes, and HIV (human immunodeficiency virus). Treatment is also important for preventing complications in pregnant women, because the condition can cause an early (premature) delivery.  This condition is treated with antibiotic medicines. These may be given as a pill, a vaginal cream, or a medicine that is put into the vagina (suppository). This information is not intended to replace advice given to you by your health care provider. Make sure you discuss any questions you have with your health care provider. Document Released: 07/08/2005 Document Revised: 06/20/2017 Document Reviewed: 03/23/2016 Elsevier Patient Education  2020 [employer] Inc.  

## 2019-03-26 LAB — C. TRACHOMATIS/N. GONORRHOEAE RNA
C. trachomatis RNA, TMA: NOT DETECTED
N. gonorrhoeae RNA, TMA: NOT DETECTED

## 2019-04-12 ENCOUNTER — Encounter: Payer: Self-pay | Admitting: Gynecology

## 2019-04-30 ENCOUNTER — Encounter: Payer: Self-pay | Admitting: Adult Health

## 2019-04-30 ENCOUNTER — Other Ambulatory Visit: Payer: Self-pay

## 2019-04-30 ENCOUNTER — Ambulatory Visit (INDEPENDENT_AMBULATORY_CARE_PROVIDER_SITE_OTHER): Payer: BC Managed Care – PPO | Admitting: Adult Health

## 2019-04-30 VITALS — BP 114/60 | Temp 98.1°F | Wt 145.0 lb

## 2019-04-30 DIAGNOSIS — Z0000 Encounter for general adult medical examination without abnormal findings: Secondary | ICD-10-CM | POA: Diagnosis not present

## 2019-04-30 DIAGNOSIS — N920 Excessive and frequent menstruation with regular cycle: Secondary | ICD-10-CM

## 2019-04-30 LAB — CBC WITH DIFFERENTIAL/PLATELET
Basophils Absolute: 0 10*3/uL (ref 0.0–0.1)
Basophils Relative: 0.4 % (ref 0.0–3.0)
Eosinophils Absolute: 0 10*3/uL (ref 0.0–0.7)
Eosinophils Relative: 0.5 % (ref 0.0–5.0)
HCT: 37.9 % (ref 36.0–46.0)
Hemoglobin: 12.5 g/dL (ref 12.0–15.0)
Lymphocytes Relative: 31.4 % (ref 12.0–46.0)
Lymphs Abs: 1.2 10*3/uL (ref 0.7–4.0)
MCHC: 33 g/dL (ref 30.0–36.0)
MCV: 89.4 fl (ref 78.0–100.0)
Monocytes Absolute: 0.3 10*3/uL (ref 0.1–1.0)
Monocytes Relative: 8.2 % (ref 3.0–12.0)
Neutro Abs: 2.3 10*3/uL (ref 1.4–7.7)
Neutrophils Relative %: 59.5 % (ref 43.0–77.0)
Platelets: 166 10*3/uL (ref 150.0–400.0)
RBC: 4.23 Mil/uL (ref 3.87–5.11)
RDW: 12.7 % (ref 11.5–15.5)
WBC: 3.8 10*3/uL — ABNORMAL LOW (ref 4.0–10.5)

## 2019-04-30 LAB — IBC + FERRITIN
Ferritin: 5.9 ng/mL — ABNORMAL LOW (ref 10.0–291.0)
Iron: 42 ug/dL (ref 42–145)
Saturation Ratios: 9.6 % — ABNORMAL LOW (ref 20.0–50.0)
Transferrin: 312 mg/dL (ref 212.0–360.0)

## 2019-04-30 LAB — COMPREHENSIVE METABOLIC PANEL
ALT: 16 U/L (ref 0–35)
AST: 15 U/L (ref 0–37)
Albumin: 4.6 g/dL (ref 3.5–5.2)
Alkaline Phosphatase: 31 U/L — ABNORMAL LOW (ref 39–117)
BUN: 13 mg/dL (ref 6–23)
CO2: 29 mEq/L (ref 19–32)
Calcium: 10.4 mg/dL (ref 8.4–10.5)
Chloride: 103 mEq/L (ref 96–112)
Creatinine, Ser: 0.74 mg/dL (ref 0.40–1.20)
GFR: 115.92 mL/min (ref >60.00)
Glucose, Bld: 82 mg/dL (ref 70–99)
Potassium: 4.6 mEq/L (ref 3.5–5.1)
Sodium: 138 mEq/L (ref 135–145)
Total Bilirubin: 0.7 mg/dL (ref 0.2–1.2)
Total Protein: 7.3 g/dL (ref 6.0–8.3)

## 2019-04-30 LAB — LIPID PANEL
Cholesterol: 142 mg/dL (ref 0–200)
HDL: 90.1 mg/dL (ref >39.00)
LDL Cholesterol: 45 mg/dL (ref 0–99)
NonHDL: 51.98
Total CHOL/HDL Ratio: 2
Triglycerides: 33 mg/dL (ref 0.0–149.0)
VLDL: 6.6 mg/dL (ref 0.0–40.0)

## 2019-04-30 LAB — TSH: TSH: 0.6 u[IU]/mL (ref 0.35–4.50)

## 2019-04-30 NOTE — Progress Notes (Signed)
Subjective:    Patient ID: Veronica Mclean, female    DOB: [birth year], 23 y.o.   MRN: [id number]  HPI Patient presents for yearly preventative medicine examination. She is a pleasant 23 year old female who  has a past medical history of Chlamydia (09/2010, 01/2011, 12/2014), Gonorrhea (09/2010, 12/2014), and HSV infection.   Heavy Menses - is seen by GYN. Not currently taking iron supplement. She does not want to go on [facility] to help.   All immunizations and health maintenance protocols were reviewed with the patient and needed orders were placed.  Appropriate screening laboratory values were ordered for the patient including screening of hyperlipidemia, renal function and hepatic function.  Medication reconciliation,  past medical history, social history, problem list and allergies were reviewed in detail with the patient  Goals were established with regard to weight loss, exercise, and  diet in compliance with medications  She is seen by GYN for care.   She has no acute complaints.    Review of Systems  Constitutional: Negative.   HENT: Negative.   Eyes: Negative.   Respiratory: Negative.   Cardiovascular: Negative.   Gastrointestinal: Negative.   Endocrine: Negative.   Genitourinary: Negative.   Musculoskeletal: Negative.   Skin: Negative.   Allergic/Immunologic: Negative.   Neurological: Negative.   Hematological: Negative.   Psychiatric/Behavioral: Negative.    Past Medical History:  Diagnosis Date  . Chlamydia 09/2010, 01/2011, 12/2014  . Gonorrhea 09/2010, 12/2014  . HSV infection     Social History   Socioeconomic History  . Marital status: Single    Spouse name: Not on file  . Number of children: Not on file  . Years of education: Not on file  . Highest education level: Not on file  Occupational History  . Not on file  Social Needs  . Financial resource strain: Not on file  . Food insecurity    Worry: Not on file    Inability: Not on file  .  Transportation needs    Medical: Not on file    Non-medical: Not on file  Tobacco Use  . Smoking status: Former Smoker    Types: Cigars    Start date: 01/27/2019  . Smokeless tobacco: Never Used  Substance and Sexual Activity  . Alcohol use: Yes    Alcohol/week: 0.0 standard drinks    Frequency: Never    Comment: Social  . Drug use: Yes    Types: Marijuana  . Sexual activity: Yes  Lifestyle  . Physical activity    Days per week: Not on file    Minutes per session: Not on file  . Stress: Not on file  Relationships  . Social Herbalist on phone: Not on file    Gets together: Not on file    Attends religious service: Not on file    Active member of club or organization: Not on file    Attends meetings of clubs or organizations: Not on file    Relationship status: Not on file  . Intimate partner violence    Fear of current or ex partner: Not on file    Emotionally abused: Not on file    Physically abused: Not on file    Forced sexual activity: Not on file  Other Topics Concern  . Not on file  Social History Narrative   [employer]   Works at [employer].  Past Surgical History:  Procedure Laterality Date  . NO PAST SURGERIES      No family history on file.  No Known Allergies  Current Outpatient Medications on File Prior to Visit  Medication Sig Dispense Refill  . metroNIDAZOLE (FLAGYL) 500 MG tablet Take 1 tablet (500 mg total) by mouth 2 (two) times daily. 14 tablet 0   No current facility-administered medications on file prior to visit.     There were no vitals taken for this visit.      Objective:   Physical Exam Vitals signs and nursing note reviewed.  Constitutional:      General: She is not in acute distress.    Appearance: Normal appearance. She is normal weight. She is not diaphoretic.  HENT:     Head: Normocephalic and atraumatic.     Right Ear: Tympanic membrane, ear canal and external ear normal. There  is no impacted cerumen.     Left Ear: Tympanic membrane, ear canal and external ear normal. There is no impacted cerumen.     Nose: Nose normal. No congestion or rhinorrhea.     Mouth/Throat:     Mouth: Mucous membranes are moist.     Pharynx: Oropharynx is clear. No oropharyngeal exudate.  Eyes:     General: No scleral icterus.       Right eye: No discharge.        Left eye: No discharge.     Conjunctiva/sclera: Conjunctivae normal.     Pupils: Pupils are equal, round, and reactive to light.  Neck:     Musculoskeletal: Normal range of motion and neck supple.     Thyroid: No thyromegaly.     Vascular: No JVD.     Trachea: No tracheal deviation.  Cardiovascular:     Rate and Rhythm: Normal rate and regular rhythm.     Pulses: Normal pulses.     Heart sounds: Normal heart sounds. No murmur. No friction rub. No gallop.   Pulmonary:     Effort: Pulmonary effort is normal. No respiratory distress.     Breath sounds: Normal breath sounds. No stridor. No wheezing or rales.  Chest:     Chest wall: No tenderness.  Abdominal:     General: Bowel sounds are normal. There is no distension.     Palpations: Abdomen is soft. There is no mass.     Tenderness: There is no abdominal tenderness. There is no right CVA tenderness, left CVA tenderness, guarding or rebound.     Hernia: No hernia is present.  Musculoskeletal: Normal range of motion.        General: No swelling, tenderness, deformity or signs of injury.     Right lower leg: No edema.     Left lower leg: No edema.  Lymphadenopathy:     Cervical: No cervical adenopathy.  Skin:    General: Skin is warm and dry.     Capillary Refill: Capillary refill takes less than 2 seconds.     Coloration: Skin is not jaundiced or pale.     Findings: No bruising, erythema, lesion or rash.  Neurological:     General: No focal deficit present.     Mental Status: She is alert and oriented to person, place, and time. Mental status is at baseline.      Cranial Nerves: No cranial nerve deficit.     Sensory: No sensory deficit.     Motor: No weakness or abnormal muscle tone.     Coordination: Coordination  normal.     Gait: Gait normal.     Deep Tendon Reflexes: Reflexes normal.  Psychiatric:        Mood and Affect: Mood normal.        Thought Content: Thought content normal.        Judgment: Judgment normal.       Assessment & Plan:  1. Routine general medical examination at a health care facility - CBC with Differential/Platelet - Comprehensive metabolic panel - Lipid panel - TSH  2. Menorrhagia with regular cycle - Consider adding iron supplement  - CBC with Differential/Platelet - IBC + Ferritin   Shirline Frees, NP

## 2019-05-12 ENCOUNTER — Encounter: Payer: Self-pay | Admitting: Family Medicine

## 2019-06-13 ENCOUNTER — Ambulatory Visit (HOSPITAL_COMMUNITY)
Admission: EM | Admit: 2019-06-13 | Discharge: 2019-06-13 | Disposition: A | Discharge status: Home / Self Care | Payer: BC Managed Care – PPO | Attending: Urgent Care | Admitting: Urgent Care

## 2019-06-13 ENCOUNTER — Ambulatory Visit (INDEPENDENT_AMBULATORY_CARE_PROVIDER_SITE_OTHER): Payer: BC Managed Care – PPO

## 2019-06-13 ENCOUNTER — Encounter (HOSPITAL_COMMUNITY): Payer: Self-pay

## 2019-06-13 ENCOUNTER — Other Ambulatory Visit: Payer: Self-pay

## 2019-06-13 DIAGNOSIS — M25531 Pain in right wrist: Secondary | ICD-10-CM

## 2019-06-13 DIAGNOSIS — S60211A Contusion of right wrist, initial encounter: Secondary | ICD-10-CM

## 2019-06-13 DIAGNOSIS — M79641 Pain in right hand: Secondary | ICD-10-CM

## 2019-06-13 MED ORDER — NAPROXEN 500 MG PO TABS: 500.0000 mg | ORAL_TABLET | Freq: Two times a day (BID) | ORAL | 0 refills | Status: DC

## 2019-06-13 MED ORDER — KETOROLAC TROMETHAMINE 60 MG/2ML IM SOLN
INTRAMUSCULAR | Status: AC
  Filled 2019-06-13: qty 2

## 2019-06-13 MED ORDER — KETOROLAC TROMETHAMINE 60 MG/2ML IM SOLN
60.0000 mg | Freq: Once | INTRAMUSCULAR | Status: AC
  Administered 2019-06-13: 60 mg via INTRAMUSCULAR

## 2019-06-13 NOTE — ED Triage Notes (Signed)
Patient presents to Urgent Care with complaints of right wrist pain since 2 days ago. Patient reports she fell on it wrong, wrist is wrapped upon arrival.

## 2019-06-13 NOTE — ED Provider Notes (Signed)
[place]   MRN: [id number] DOB: May 13, 1996  Subjective:   Veronica Mclean is a 23 y.o. female presenting for 2-day history of acute onset right-sided wrist pain.  Patient states that she suffered an accidental fall on outstretched hand and made impact with that side over right wrist and hand.  She has had significant difficulty moving and using her wrist and hand.  She is very concerned that she has to go to work Midwife.  She is using Ace wrap and propped her wrist up.  This is given her minimal relief.  Denies taking any chronic medications.  No Known Allergies  Past Medical History:  Diagnosis Date  . Chlamydia 09/2010, 01/2011, 12/2014  . Gonorrhea 09/2010, 12/2014  . HSV infection      Past Surgical History:  Procedure Laterality Date  . NO PAST SURGERIES      Family History  Problem Relation Age of Onset  . Hypertension Mother   . Healthy Father     Social History   Tobacco Use  . Smoking status: Current Every Day Smoker    Types: Cigars    Start date: 01/27/2019  . Smokeless tobacco: Never Used  . Tobacco comment: 1-2 black and milds per day  Substance Use Topics  . Alcohol use: Yes    Alcohol/week: 0.0 standard drinks    Frequency: Never    Comment: Social  . Drug use: Yes    Types: Marijuana    ROS Denies bruising, frank swelling, bleeding, laceration, bony deformity.  Objective:   Vitals: BP 111/61 (BP Location: Left Arm)   Pulse 68   Temp 98.3 F (36.8 C) (Oral)   Resp 15   SpO2 100%   Physical Exam Constitutional:      General: She is not in acute distress.    Appearance: Normal appearance. She is well-developed. She is not ill-appearing.  HENT:     Head: Normocephalic and atraumatic.     Nose: Nose normal.     Mouth/Throat:     Mouth: Mucous membranes are moist.     Pharynx: Oropharynx is clear.  Eyes:     General: No scleral icterus.    Extraocular Movements: Extraocular movements intact.     Pupils: Pupils are equal,  round, and reactive to light.  Cardiovascular:     Rate and Rhythm: Normal rate.  Pulmonary:     Effort: Pulmonary effort is normal.  Musculoskeletal:     Right wrist: She exhibits decreased range of motion (Patient refuses to try active range of motion, also refused passive range of motion testing), tenderness (Physical exam findings out of proportion to reported pain) and bony tenderness (On lightest palpation of the lateral aspect of her right wrist and proximal portion of right hand). She exhibits no swelling, no effusion, no crepitus, no deformity and no laceration.  Skin:    General: Skin is warm and dry.  Neurological:     General: No focal deficit present.     Mental Status: She is alert and oriented to person, place, and time.  Psychiatric:        Mood and Affect: Mood normal.        Behavior: Behavior normal.     Dg Wrist Complete Right  Result Date: 06/13/2019 CLINICAL DATA:  Right wrist pain after fall EXAM: RIGHT WRIST - COMPLETE 3+ VIEW COMPARISON:  None. FINDINGS: Slightly suboptimal patient positioning on lateral view. There is no evidence of fracture or dislocation. There is no evidence  of arthropathy or other focal bone abnormality. Soft tissues are unremarkable. IMPRESSION: Negative. Electronically Signed   By: Duanne Guess M.D.   On: 06/13/2019 15:36     Assessment and Plan :   1. Contusion of right wrist, initial encounter   2. Acute pain of right wrist   3. Right hand pain     X-ray is completely normal, exam is difficult due to patient's reported pain.  I was able to wrap an Ace wrap.  This brought patient to tears but I reassured her that there is no fracture or dislocation.  Recommended using naproxen, IM Toradol in clinic.  Provided with work note for a couple of days. Counseled patient on potential for adverse effects with medications prescribed/recommended today, ER and return-to-clinic precautions discussed, patient verbalized understanding.     Wallis Bamberg, PA-C 06/13/19 1555

## 2019-10-01 ENCOUNTER — Telehealth: Payer: Self-pay | Admitting: Adult Health

## 2019-10-01 DIAGNOSIS — N926 Irregular menstruation, unspecified: Secondary | ICD-10-CM

## 2019-10-01 NOTE — Telephone Encounter (Signed)
Tried reaching the pt by telephone.  Received an automated message that the voicemail box is full and cannot accept messages.  Per Veronica Mclean, pt should see gynecologist.  Will order referral.  Pt needs to be notified.

## 2019-10-01 NOTE — Telephone Encounter (Signed)
Pt has been on her cycle since 09/10/2019 only going off for a little bit and coming back on heavier.  Pt state that she has not had and OB-GYN since the birth of her daughter and would like to come in the office and decline a virtual appointment.  Is there anything that we can do for this pt?

## 2019-10-05 NOTE — Telephone Encounter (Signed)
Tried X 2 to reach the pt.  Call was disconnected both times.  Will try again at a later time.

## 2019-10-06 NOTE — Telephone Encounter (Signed)
Left a message on voicemail (ok per dpr) informing the pt that I was calling to make sure she has received care.  Advised a call back if there was anything Kandee Keen or I could do.  Have made several attempts to reach the pt.  Will now close encounter.

## 2019-11-02 ENCOUNTER — Other Ambulatory Visit: Payer: Self-pay

## 2019-11-03 ENCOUNTER — Other Ambulatory Visit: Payer: Self-pay

## 2019-11-03 ENCOUNTER — Encounter: Payer: Self-pay | Admitting: Women's Health

## 2019-11-03 ENCOUNTER — Ambulatory Visit (INDEPENDENT_AMBULATORY_CARE_PROVIDER_SITE_OTHER): Payer: BC Managed Care – PPO | Admitting: Women's Health

## 2019-11-03 VITALS — BP 128/78 | Ht 67.0 in | Wt 134.0 lb

## 2019-11-03 DIAGNOSIS — Q513 Bicornate uterus: Secondary | ICD-10-CM

## 2019-11-03 DIAGNOSIS — B9689 Other specified bacterial agents as the cause of diseases classified elsewhere: Secondary | ICD-10-CM | POA: Diagnosis not present

## 2019-11-03 DIAGNOSIS — Z113 Encounter for screening for infections with a predominantly sexual mode of transmission: Secondary | ICD-10-CM

## 2019-11-03 DIAGNOSIS — N898 Other specified noninflammatory disorders of vagina: Secondary | ICD-10-CM

## 2019-11-03 DIAGNOSIS — N920 Excessive and frequent menstruation with regular cycle: Secondary | ICD-10-CM | POA: Diagnosis not present

## 2019-11-03 DIAGNOSIS — Z01419 Encounter for gynecological examination (general) (routine) without abnormal findings: Secondary | ICD-10-CM

## 2019-11-03 LAB — WET PREP FOR TRICH, YEAST, CLUE

## 2019-11-03 MED ORDER — METRONIDAZOLE 500 MG PO TABS: 500.0000 mg | ORAL_TABLET | Freq: Two times a day (BID) | ORAL | 0 refills | Status: DC

## 2019-11-03 NOTE — Patient Instructions (Signed)
MVI daily It has been a pleasure knowing you  Health Maintenance, Female Adopting a healthy lifestyle and getting preventive care are important in promoting health and wellness. Ask your health care provider about:  The right schedule for you to have regular tests and exams.  Things you can do on your own to prevent diseases and keep yourself healthy. What should I know about diet, weight, and exercise? Eat a healthy diet   Eat a diet that includes plenty of vegetables, fruits, low-fat dairy products, and lean protein.  Do not eat a lot of foods that are high in solid fats, added sugars, or sodium. Maintain a healthy weight Body mass index (BMI) is used to identify weight problems. It estimates body fat based on height and weight. Your health care provider can help determine your BMI and help you achieve or maintain a healthy weight. Get regular exercise Get regular exercise. This is one of the most important things you can do for your health. Most adults should:  Exercise for at least 150 minutes each week. The exercise should increase your heart rate and make you sweat (moderate-intensity exercise).  Do strengthening exercises at least twice a week. This is in addition to the moderate-intensity exercise.  Spend less time sitting. Even light physical activity can be beneficial. Watch cholesterol and blood lipids Have your blood tested for lipids and cholesterol at 25 years of age, then have this test every 5 years. Have your cholesterol levels checked more often if:  Your lipid or cholesterol levels are high.  You are older than 25 years of age.  You are at high risk for heart disease. What should I know about cancer screening? Depending on your health history and family history, you may need to have cancer screening at various ages. This may include screening for:  Breast cancer.  Cervical cancer.  Colorectal cancer.  Skin cancer.  Lung cancer. What should I know  about heart disease, diabetes, and high blood pressure? Blood pressure and heart disease  High blood pressure causes heart disease and increases the risk of stroke. This is more likely to develop in people who have high blood pressure readings, are of African descent, or are overweight.  Have your blood pressure checked: ? Every 3-5 years if you are 62-56 years of age. ? Every year if you are 53 years old or older. Diabetes Have regular diabetes screenings. This checks your fasting blood sugar level. Have the screening done:  Once every three years after age 29 if you are at a normal weight and have a low risk for diabetes.  More often and at a younger age if you are overweight or have a high risk for diabetes. What should I know about preventing infection? Hepatitis B If you have a higher risk for hepatitis B, you should be screened for this virus. Talk with your health care provider to find out if you are at risk for hepatitis B infection. Hepatitis C Testing is recommended for:  Everyone born from [birth year] through 1966.  Anyone with known risk factors for hepatitis C. Sexually transmitted infections (STIs)  Get screened for STIs, including gonorrhea and chlamydia, if: ? You are sexually active and are younger than 25 years of age. ? You are older than 25 years of age and your health care provider tells you that you are at risk for this type of infection. ? Your sexual activity has changed since you were last screened, and you are at increased risk  for chlamydia or gonorrhea. Ask your health care provider if you are at risk.  Ask your health care provider about whether you are at high risk for HIV. Your health care provider may recommend a prescription medicine to help prevent HIV infection. If you choose to take medicine to prevent HIV, you should first get tested for HIV. You should then be tested every 3 months for as long as you are taking the medicine. Pregnancy  If you are about  to stop having your period (premenopausal) and you may become pregnant, seek counseling before you get pregnant.  Take 400 to 800 micrograms (mcg) of folic acid every day if you become pregnant.  Ask for birth control (contraception) if you want to prevent pregnancy. Osteoporosis and menopause Osteoporosis is a disease in which the bones lose minerals and strength with aging. This can result in bone fractures. If you are 52 years old or older, or if you are at risk for osteoporosis and fractures, ask your health care provider if you should:  Be screened for bone loss.  Take a calcium or vitamin D supplement to lower your risk of fractures.  Be given hormone replacement therapy (HRT) to treat symptoms of menopause. Follow these instructions at home: Lifestyle  Do not use any products that contain nicotine or tobacco, such as cigarettes, e-cigarettes, and chewing tobacco. If you need help quitting, ask your health care provider.  Do not use street drugs.  Do not share needles.  Ask your health care provider for help if you need support or information about quitting drugs. Alcohol use  Do not drink alcohol if: ? Your health care provider tells you not to drink. ? You are pregnant, may be pregnant, or are planning to become pregnant.  If you drink alcohol: ? Limit how much you use to 0-1 drink a day. ? Limit intake if you are breastfeeding.  Be aware of how much alcohol is in your drink. In the [place] one drink equals one 12 oz bottle of beer (355 mL), one 5 oz glass of wine (148 mL), or one 1 oz glass of hard liquor (44 mL). General instructions  Schedule regular health, dental, and eye exams.  Stay current with your vaccines.  Tell your health care provider if: ? You often feel depressed. ? You have ever been abused or do not feel safe at home. Summary  Adopting a healthy lifestyle and getting preventive care are important in promoting health and wellness.  Follow your  health care provider's instructions about healthy diet, exercising, and getting tested or screened for diseases.  Follow your health care provider's instructions on monitoring your cholesterol and blood pressure. This information is not intended to replace advice given to you by your health care provider. Make sure you discuss any questions you have with your health care provider. Document Revised: 07/01/2018 Document Reviewed: 07/01/2018 Elsevier Patient Education  2020 [employer].

## 2019-11-03 NOTE — Progress Notes (Signed)
Veronica Mclean [birth year] [id number]    History:    Presents for annual exam.  Monthly cycle with spotting last couple of months. Condoms/new partner.  Normal Pap history.  Has had Gardasil.  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  History of positive GC and chlamydia in the past.  Past medical history, past surgical history, family history and social history were all reviewed and documented in the EPIC chart.  Works in a factory.  Numerous tattoos.  Mother healthy.  ROS:  A ROS was performed and pertinent positives and negatives are included.  Exam:  Vitals:   11/03/19 0944  BP: 128/78  Weight: 134 lb (60.8 kg)  Height: 5\' 7"  (1.702 m)   Body mass index is 20.99 kg/m.   General appearance:  Normal Thyroid:  Symmetrical, normal in size, without palpable masses or nodularity. Respiratory  Auscultation:  Clear without wheezing or rhonchi Cardiovascular  Auscultation:  Regular rate, without rubs, murmurs or gallops  Edema/varicosities:  Not grossly evident Abdominal  Soft,nontender, without masses, guarding or rebound.  Liver/spleen:  No organomegaly noted  Hernia:  None appreciated  Skin  Inspection:  Grossly normal   Breasts: Examined lying and sitting.     Right: Without masses, retractions, discharge or axillary adenopathy.     Left: Without masses, retractions, discharge or axillary adenopathy. Gentitourinary   Inguinal/mons:  Normal without inguinal adenopathy  External genitalia:  Normal  BUS/Urethra/Skene's glands:  Normal  Vagina: Moderate watery pink discharge wet prep positive for clues, TNTC bacteria  cervix:  Normal  Uterus:   normal in size, shape and contour.  Midline and mobile  Adnexa/parametria:     Rt: Without masses or tenderness.   Lt: Without masses or tenderness.  Anus and perineum: Normal   Assessment/Plan:  24 y.o. SBF G1P0 for annual exam with complaint of occasional spotting for the past 2 to 3 months, discharge with  odor.  Denies urinary symptoms or abdominal pain.  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  Instructed to call if discharge with odor and spotting persists.  GC/chlamydia, HIV, RPR, CBC, TSH.  Schedule sonohysterogram with Dr. 2020.  Reviewed possible need for surgery if uterus is septated.  SBEs, exercise, calcium rich foods, MVI daily encouraged.  Contraception reviewed and declined will continue condoms reviewed best not to conceive until after sonohysterogram to check uterus.  Agreeable with plan.  Seymour Bars [facility], 10:30 AM 11/03/2019

## 2019-11-04 LAB — CBC WITH DIFFERENTIAL/PLATELET
Absolute Monocytes: 331 cells/uL (ref 200–950)
Basophils Absolute: 22 cells/uL (ref 0–200)
Basophils Relative: 0.6 %
Eosinophils Absolute: 11 cells/uL — ABNORMAL LOW (ref 15–500)
Eosinophils Relative: 0.3 %
HCT: 35.5 % (ref 35.0–45.0)
Hemoglobin: 11.9 g/dL (ref 11.7–15.5)
Lymphs Abs: 623 cells/uL — ABNORMAL LOW (ref 850–3900)
MCH: 28.6 pg (ref 27.0–33.0)
MCHC: 33.5 g/dL (ref 32.0–36.0)
MCV: 85.3 fL (ref 80.0–100.0)
MPV: 11.6 fL (ref 7.5–12.5)
Monocytes Relative: 9.2 %
Neutro Abs: 2614 cells/uL (ref 1500–7800)
Neutrophils Relative %: 72.6 %
Platelets: 156 10*3/uL (ref 140–400)
RBC: 4.16 10*6/uL (ref 3.80–5.10)
RDW: 12.2 % (ref 11.0–15.0)
Total Lymphocyte: 17.3 %
WBC: 3.6 10*3/uL — ABNORMAL LOW (ref 3.8–10.8)

## 2019-11-04 LAB — TSH: TSH: 1.78 mIU/L

## 2019-11-04 LAB — RPR: RPR Ser Ql: NONREACTIVE

## 2019-11-04 LAB — HIV ANTIBODY (ROUTINE TESTING W REFLEX): HIV 1&2 Ab, 4th Generation: NONREACTIVE

## 2019-11-05 LAB — PAP THINPREP ASCUS RFLX HPV RFLX TYPE
C. trachomatis RNA, TMA: NOT DETECTED
N. gonorrhoeae RNA, TMA: NOT DETECTED

## 2019-12-02 ENCOUNTER — Other Ambulatory Visit: Payer: BC Managed Care – PPO

## 2019-12-02 ENCOUNTER — Ambulatory Visit: Payer: BC Managed Care – PPO | Admitting: Obstetrics & Gynecology

## 2020-02-14 ENCOUNTER — Ambulatory Visit (INDEPENDENT_AMBULATORY_CARE_PROVIDER_SITE_OTHER): Payer: BC Managed Care – PPO | Admitting: Obstetrics and Gynecology

## 2020-02-14 ENCOUNTER — Encounter: Payer: Self-pay | Admitting: Obstetrics and Gynecology

## 2020-02-14 ENCOUNTER — Other Ambulatory Visit: Payer: Self-pay

## 2020-02-14 ENCOUNTER — Telehealth: Payer: Self-pay | Admitting: *Deleted

## 2020-02-14 VITALS — BP 116/70

## 2020-02-14 DIAGNOSIS — Z113 Encounter for screening for infections with a predominantly sexual mode of transmission: Secondary | ICD-10-CM | POA: Diagnosis not present

## 2020-02-14 DIAGNOSIS — N760 Acute vaginitis: Secondary | ICD-10-CM | POA: Diagnosis not present

## 2020-02-14 DIAGNOSIS — R102 Pelvic and perineal pain: Secondary | ICD-10-CM | POA: Diagnosis not present

## 2020-02-14 DIAGNOSIS — B9689 Other specified bacterial agents as the cause of diseases classified elsewhere: Secondary | ICD-10-CM

## 2020-02-14 DIAGNOSIS — R110 Nausea: Secondary | ICD-10-CM

## 2020-02-14 DIAGNOSIS — N898 Other specified noninflammatory disorders of vagina: Secondary | ICD-10-CM

## 2020-02-14 MED ORDER — ONDANSETRON 4 MG PO TBDP
4.0000 mg | ORAL_TABLET | Freq: Three times a day (TID) | ORAL | 0 refills | Status: DC | PRN
Start: 2020-02-14 — End: 2020-04-21

## 2020-02-14 MED ORDER — METRONIDAZOLE 500 MG PO TABS: 500.0000 mg | ORAL_TABLET | Freq: Two times a day (BID) | ORAL | 0 refills | Status: DC

## 2020-02-14 NOTE — Telephone Encounter (Signed)
Patient called nurse triage. Patient reports  she has chronic abdominal pain that comes and goes. She was also having vomiting and diarrhea. She is not currently having the pain. The last time she experienced the pain was last night. She was last seen for this was several months ago but they did not find anything.  Patient was advised to see PCP with in 2 weeks

## 2020-02-14 NOTE — Progress Notes (Signed)
   Veronica Mclean 12-03-1995 [id number]  SUBJECTIVE:  24 y.o. G39P0100 female presents for evaluation of lower abdominal pain and vaginal discharge.  The pain feels like an intense cramping and tightening sensation, alternating sides of the lower abdomen.  She woke up Sunday morning 2 days ago with this discomfort.  She has tried some over-the-counter pain relievers which have helped just a little.  Taking 200 mg ibuprofen at a time.  Says she felt chills at work last night.  She is sexually active with the same partner of 1 year.  She has been using condoms intermittently.  She does have a history of positive trichomoniasis last year with follow-up negative testing.  No specific STD concerns at this time.  Denies any dysuria symptoms.  Only intermittent chills at work last night but no documented fever.  Denies any abnormal vaginal bleeding.  She is having some nausea and mild vomiting at times.   Current Outpatient Medications  Medication Sig Dispense Refill  . naproxen (NAPROSYN) 500 MG tablet Take 1 tablet (500 mg total) by mouth 2 (two) times daily. 30 tablet 0   No current facility-administered medications for this visit.   Allergies: Patient has no known allergies.  Patient's last menstrual period was 01/19/2020.  Past medical history,surgical history, problem list, medications, allergies, family history and social history were all reviewed and documented as reviewed in the EPIC chart.  ROS:  Feeling well. No dyspnea or chest pain on exertion.  No abdominal pain, change in bowel habits, black or bloody stools.  No urinary tract symptoms. GYN ROS: As described above in HPI   OBJECTIVE:  BP 116/70   LMP 01/19/2020  The patient appears well, alert, oriented x 3, in no distress. Abdomen: Soft, nontender, nondistended, no rebound or guarding PELVIC EXAM: VULVA: normal appearing vulva with no masses, tenderness or lesions, VAGINA: normal appearing vagina with normal color and  discharge, no lesions, CERVIX: normal appearing cervix without discharge or lesions, no cervical motion tenderness, UTERUS: uterus is normal size, shape, consistency and nontender, ADNEXA: normal adnexa in size, nontender and no masses, WET MOUNT done - results: + clue cells, no hyphae, no trichomonas, excessive bacteria, no WBC, 7-12 epithelial cells/hpf Urine pregnancy test negative (insufficient specimen to perform urinalysis)  Chaperone: Kennon Portela present during the examination  ASSESSMENT:  24 y.o. G1P0100 here for lower abdominal pain, + bacterial vaginosis  PLAN:  I suspect she is having symptoms related to bacterial vaginosis.  Will treat with metronidazole 500 mg twice daily for 7 days, discussed common side effects, need to avoid alcohol consumption. Unable to test for UTI today based on insufficient volume of sample, but she describes no dysuria symptoms and there is no tenderness over the bladder or abdominal wall.  If abdominal symptoms persist, will need to check a UA. Gonorrhea/chlamydia testing is performed today and results are pending.  Low suspicion for PID based on the examination. We will treat nausea with Zofran as needed.  Pregnancy test is negative today. Recommend using ibuprofen 400 to 600 mg every 6 hours as needed for pain along with a heat pack.  Tylenol as needed.  Work note given to excuse work absence for yesterday and today.   Theresia Majors MD 02/14/20

## 2020-02-15 DIAGNOSIS — R102 Pelvic and perineal pain: Secondary | ICD-10-CM | POA: Diagnosis not present

## 2020-02-15 DIAGNOSIS — N898 Other specified noninflammatory disorders of vagina: Secondary | ICD-10-CM | POA: Diagnosis not present

## 2020-02-15 LAB — C. TRACHOMATIS/N. GONORRHOEAE RNA
C. trachomatis RNA, TMA: NOT DETECTED
N. gonorrhoeae RNA, TMA: NOT DETECTED

## 2020-02-15 LAB — WET PREP FOR TRICH, YEAST, CLUE

## 2020-02-15 LAB — PREGNANCY, URINE: Preg Test, Ur: NEGATIVE

## 2020-02-15 NOTE — Telephone Encounter (Signed)
Left a message for pt to call the office and schedule an appt with [facility] in 2 weeks.

## 2020-02-15 NOTE — Addendum Note (Signed)
Addended by: Rushie Goltz on: 02/15/2020 08:50 AM   Modules accepted: Orders

## 2020-02-15 NOTE — Telephone Encounter (Signed)
Pt called back and was schedule

## 2020-02-19 ENCOUNTER — Ambulatory Visit: Payer: BC Managed Care – PPO | Attending: Internal Medicine

## 2020-02-19 ENCOUNTER — Other Ambulatory Visit: Payer: Self-pay

## 2020-02-19 DIAGNOSIS — Z23 Encounter for immunization: Secondary | ICD-10-CM

## 2020-02-19 NOTE — Progress Notes (Signed)
   Covid-19 Vaccination Clinic  Name:  Veronica Mclean    MRN: [id number] DOB: [birth year]  02/19/2020  [name] was observed post Covid-19 immunization for 15 minutes without incident. She was provided with Vaccine Information Sheet and instruction to access the V-Safe system.   [name] was instructed to call 911 with any severe reactions post vaccine: Marland Kitchen Difficulty breathing  . Swelling of face and throat  . A fast heartbeat  . A bad rash all over body  . Dizziness and weakness   Immunizations Administered    Name Date Dose VIS Date Route   Pfizer COVID-19 Vaccine 02/19/2020 11:26 AM 0.3 mL 09/15/2018 Intramuscular   Manufacturer: [employer], [employer]   Lot: [id number]   NDC: [address]-7

## 2020-02-29 ENCOUNTER — Other Ambulatory Visit: Payer: Self-pay

## 2020-02-29 ENCOUNTER — Ambulatory Visit (INDEPENDENT_AMBULATORY_CARE_PROVIDER_SITE_OTHER): Payer: BC Managed Care – PPO | Admitting: Adult Health

## 2020-02-29 ENCOUNTER — Encounter: Payer: Self-pay | Admitting: Adult Health

## 2020-02-29 VITALS — BP 108/62 | Temp 98.2°F | Wt 142.0 lb

## 2020-02-29 DIAGNOSIS — K5901 Slow transit constipation: Secondary | ICD-10-CM | POA: Diagnosis not present

## 2020-02-29 DIAGNOSIS — R350 Frequency of micturition: Secondary | ICD-10-CM | POA: Diagnosis not present

## 2020-02-29 DIAGNOSIS — R110 Nausea: Secondary | ICD-10-CM

## 2020-02-29 DIAGNOSIS — R1030 Lower abdominal pain, unspecified: Secondary | ICD-10-CM

## 2020-02-29 NOTE — Progress Notes (Signed)
Subjective:    Patient ID: Veronica Mclean, female    DOB: Jun 01, 1996, 24 y.o.   MRN: [id number]  HPI 24 year old female who  has a past medical history of Chlamydia (09/2010, 01/2011, 12/2014), Gonorrhea (09/2010, 12/2014), and HSV infection.  She presents to the office today for " blood pregnancy test"  She was recently seen by GYN about 2 weeks ago for lower abdominal pain and vaginal discharge.  At this time the pain felt as though as intense cramping and tightening and Sunday sensation, alternating sides of the lower abdomen.  It was suspected that she was having symptoms related to bacterial vaginosis and treated with Flagyl 500 mg twice daily x7 days  Her urine pregnancy was negative and trichomonas and gonorrhea were not detected.  Clue cells were present on wet prep.  Today she reports that the abdominal pain has improved but she continues to have some tenderness in bilateral lower quadrants per patient "I just feel  weird".  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  Nausea eventually goes away.  Denies vomiting.  She also reports low back pain, constipation, and frequent urination.  She denies fevers, chills, dysuria, hematuria, or incontinence.  Her last period was July 10th and was lighter than usual.   Review of Systems See HPI   Past Medical History:  Diagnosis Date  . Chlamydia 09/2010, 01/2011, 12/2014  . Gonorrhea 09/2010, 12/2014  . HSV infection     Social History   Socioeconomic History  . Marital status: Single    Spouse name: Not on file  . Number of children: Not on file  . Years of education: Not on file  . Highest education level: Not on file  Occupational History  . Not on file  Tobacco Use  . Smoking status: Current Every Day Smoker    Types: Cigars    Start date: 01/27/2019  . Smokeless tobacco: Never Used  . Tobacco comment: 1-2 black and milds per day  Vaping Use    . Vaping Use: Never used  Substance and Sexual Activity  . Alcohol use: Yes    Alcohol/week: 0.0 standard drinks    Comment: Social  . Drug use: Yes    Types: Marijuana  . Sexual activity: Yes    Birth control/protection: Condom    Comment: 1st intercourse 30 yo-5 partners  Other Topics Concern  . Not on file  Social History Narrative   [employer]   Works at [employer].      Social Determinants of Health   Financial Resource Strain:   . Difficulty of Paying Living Expenses:   Food Insecurity:   . Worried About Programme researcher, broadcasting/film/video in the Last Year:   . Barista in the Last Year:   Transportation Needs:   . Freight forwarder (Medical):   Marland Kitchen Lack of Transportation (Non-Medical):   Physical Activity:   . Days of Exercise per Week:   . Minutes of Exercise per Session:   Stress:   . Feeling of Stress :   Social Connections:   . Frequency of Communication with Friends and Family:   . Frequency of Social Gatherings with Friends and Family:   . Attends Religious Services:   . Active Member of Clubs or Organizations:   . Attends Banker Meetings:   .  Marital Status:   Intimate Partner Violence:   . Fear of Current or Ex-Partner:   . Emotionally Abused:   Marland Kitchen Physically Abused:   . Sexually Abused:     Past Surgical History:  Procedure Laterality Date  . NO PAST SURGERIES      Family History  Problem Relation Age of Onset  . Hypertension Mother   . Healthy Father     No Known Allergies  Current Outpatient Medications on File Prior to Visit  Medication Sig Dispense Refill  . naproxen (NAPROSYN) 500 MG tablet Take 1 tablet (500 mg total) by mouth 2 (two) times daily. 30 tablet 0  . ondansetron (ZOFRAN ODT) 4 MG disintegrating tablet Take 1 tablet (4 mg total) by mouth every 8 (eight) hours as needed for nausea or vomiting. 10 tablet 0   No current facility-administered medications on file prior to visit.    BP  108/62   Temp 98.2 F (36.8 C)   Wt 142 lb (64.4 kg) Comment: Steel toes  BMI 22.24 kg/m       Objective:   Physical Exam Vitals and nursing note reviewed.  Constitutional:      Appearance: Normal appearance.  Cardiovascular:     Rate and Rhythm: Normal rate and regular rhythm.     Pulses: Normal pulses.     Heart sounds: Normal heart sounds.  Pulmonary:     Effort: Pulmonary effort is normal.     Breath sounds: Normal breath sounds.  Abdominal:     General: Abdomen is flat. Bowel sounds are normal.     Palpations: Abdomen is soft.     Tenderness: There is abdominal tenderness in the right lower quadrant, suprapubic area and left lower quadrant. There is no right CVA tenderness, left CVA tenderness, guarding or rebound.     Hernia: No hernia is present.       Assessment & Plan:  1. Nausea - Abx use? GERD? Constipation?  - HCG, Quant, Pregnancy; Future - Basic Metabolic Panel; Future - CBC with Differential/Platelet; Future - Urinalysis; Future - Urinalysis - CBC with Differential/Platelet - Basic Metabolic Panel - HCG, Quant, Pregnancy  2. Lower abdominal pain  - HCG, Quant, Pregnancy; Future - Basic Metabolic Panel; Future - CBC with Differential/Platelet; Future - Urinalysis; Future  3. Urinary frequency  - HCG, Quant, Pregnancy; Future - Basic Metabolic Panel; Future - CBC with Differential/Platelet; Future - Urinalysis; Future  4. Slow transit constipation - Increase fiber and water intake  - HCG, Quant, Pregnancy; Future - Basic Metabolic Panel; Future - CBC with Differential/Platelet; Future - Urinalysis; Future   Shirline Frees, NP

## 2020-03-01 LAB — CBC WITH DIFFERENTIAL/PLATELET
Absolute Monocytes: 414 cells/uL (ref 200–950)
Basophils Absolute: 19 cells/uL (ref 0–200)
Basophils Relative: 0.5 %
Eosinophils Absolute: 30 cells/uL (ref 15–500)
Eosinophils Relative: 0.8 %
HCT: 38.4 % (ref 35.0–45.0)
Hemoglobin: 12.5 g/dL (ref 11.7–15.5)
Lymphs Abs: 1369 cells/uL (ref 850–3900)
MCH: 28.7 pg (ref 27.0–33.0)
MCHC: 32.6 g/dL (ref 32.0–36.0)
MCV: 88.3 fL (ref 80.0–100.0)
MPV: 11.2 fL (ref 7.5–12.5)
Monocytes Relative: 11.2 %
Neutro Abs: 1869 cells/uL (ref 1500–7800)
Neutrophils Relative %: 50.5 %
Platelets: 196 10*3/uL (ref 140–400)
RBC: 4.35 10*6/uL (ref 3.80–5.10)
RDW: 12.8 % (ref 11.0–15.0)
Total Lymphocyte: 37 %
WBC: 3.7 10*3/uL — ABNORMAL LOW (ref 3.8–10.8)

## 2020-03-01 LAB — URINALYSIS
Bilirubin Urine: NEGATIVE
Glucose, UA: NEGATIVE
Hgb urine dipstick: NEGATIVE
Ketones, ur: NEGATIVE
Nitrite: NEGATIVE
Protein, ur: NEGATIVE
Specific Gravity, Urine: 1.009 (ref 1.001–1.03)
pH: 6 (ref 5.0–8.0)

## 2020-03-01 LAB — BASIC METABOLIC PANEL
BUN: 19 mg/dL (ref 7–25)
CO2: 29 mmol/L (ref 20–32)
Calcium: 9.9 mg/dL (ref 8.6–10.2)
Chloride: 103 mmol/L (ref 98–110)
Creat: 0.84 mg/dL (ref 0.50–1.10)
Glucose, Bld: 103 mg/dL — ABNORMAL HIGH (ref 65–99)
Potassium: 4.9 mmol/L (ref 3.5–5.3)
Sodium: 138 mmol/L (ref 135–146)

## 2020-03-01 LAB — HCG, QUANTITATIVE, PREGNANCY: HCG, Total, QN: <3 m[IU]/mL

## 2020-03-14 ENCOUNTER — Ambulatory Visit: Payer: BC Managed Care – PPO

## 2020-04-20 ENCOUNTER — Ambulatory Visit: Payer: BC Managed Care – PPO | Admitting: Nurse Practitioner

## 2020-04-21 ENCOUNTER — Other Ambulatory Visit: Payer: Self-pay

## 2020-04-21 ENCOUNTER — Ambulatory Visit (INDEPENDENT_AMBULATORY_CARE_PROVIDER_SITE_OTHER): Payer: BC Managed Care – PPO | Admitting: Obstetrics and Gynecology

## 2020-04-21 ENCOUNTER — Encounter: Payer: Self-pay | Admitting: Obstetrics and Gynecology

## 2020-04-21 VITALS — BP 116/70

## 2020-04-21 DIAGNOSIS — N939 Abnormal uterine and vaginal bleeding, unspecified: Secondary | ICD-10-CM

## 2020-04-21 DIAGNOSIS — Q519 Congenital malformation of uterus and cervix, unspecified: Secondary | ICD-10-CM | POA: Diagnosis not present

## 2020-04-21 LAB — PREGNANCY, URINE: Preg Test, Ur: NEGATIVE

## 2020-04-21 NOTE — Progress Notes (Signed)
   Grayson White 1995-12-01 [id number]  SUBJECTIVE:  24 y.o. 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  She has chronically irregular periods, her last 2 periods been late, last month she was about a week late, this month a few days late.  Typically she will have 3 to 4 days of a menstrual bleeding, but this month she had a pretty heavy period for about 8 days before it tapered off.  This prevented her from going to work.  She did intermittently pass small clots.  She has cramping on the..  Bleeding has resolved now.  She is sexually active and intermittently uses condoms but would welcome a pregnancy.  No new sexual partners.  No STI or discharge concerns today.  Normal Pap 10/2019.   No current outpatient medications on file.   No current facility-administered medications for this visit.   Allergies: Patient has no known allergies.  Patient's last menstrual period was 04/12/2020.  Past medical history,surgical history, problem list, medications, allergies, family history and social history were all reviewed and documented as reviewed in the EPIC chart.  ROS: Pertinent positives and negatives as reviewed in HPI   OBJECTIVE:  BP 116/70   LMP 04/12/2020  The patient appears well, alert, oriented, in no distress. PELVIC EXAM: VULVA: normal appearing vulva with no masses, tenderness or lesions, VAGINA: normal appearing vagina with normal color and discharge, no lesions, CERVIX: normal appearing cervix without discharge or lesions  Chaperone: Kennon Portela present during the examination  ASSESSMENT:   24 y.o. G1P0100 here for abnormal uterine bleeding  PLAN:  We will check UPT We discussed that there are multiple potential causes for abnormal uterine bleeding including structural causes such as polyps and fibroids.  I recommended checking a sonohysterogram as  she did have a regular ultrasound a year ago that identified a probable septate versus bicornuate uterus.  Recently had normal TSH earlier this year.  Sometimes hormonal changes or another cause for the abnormal bleeding requires need for hormonal intervention through either birth control or cyclic progestins.  We will further discuss these options after we see her back for the pelvic imaging.  I did give her a work note to excuse her from work for 5 days last week during the heaviest part of her cycle.   Theresia Majors MD 04/21/20

## 2020-04-27 ENCOUNTER — Other Ambulatory Visit: Payer: Self-pay

## 2020-04-27 ENCOUNTER — Ambulatory Visit (INDEPENDENT_AMBULATORY_CARE_PROVIDER_SITE_OTHER): Payer: BC Managed Care – PPO

## 2020-04-27 ENCOUNTER — Encounter: Payer: Self-pay | Admitting: Obstetrics and Gynecology

## 2020-04-27 ENCOUNTER — Ambulatory Visit (INDEPENDENT_AMBULATORY_CARE_PROVIDER_SITE_OTHER): Payer: BC Managed Care – PPO | Admitting: Obstetrics and Gynecology

## 2020-04-27 ENCOUNTER — Other Ambulatory Visit: Payer: Self-pay | Admitting: Obstetrics and Gynecology

## 2020-04-27 DIAGNOSIS — Q513 Bicornate uterus: Secondary | ICD-10-CM | POA: Diagnosis not present

## 2020-04-27 DIAGNOSIS — N939 Abnormal uterine and vaginal bleeding, unspecified: Secondary | ICD-10-CM

## 2020-04-27 DIAGNOSIS — N854 Malposition of uterus: Secondary | ICD-10-CM | POA: Diagnosis not present

## 2020-04-27 MED ORDER — MEDROXYPROGESTERONE ACETATE 10 MG PO TABS: 10.0000 mg | ORAL_TABLET | Freq: Every day | ORAL | 1 refills | Status: DC

## 2020-04-27 NOTE — Progress Notes (Signed)
   Veronica Mclean [birth year] [id number]  SUBJECTIVE:  24 y.o. G80P0100 female presents for a pelvic ultrasound to evaluate abnormal uterine bleeding and heavy periods.  Please see the previous note for details.  We were planning to do a sonohysterogram, but did not because of the finding of a bicornuate uterus today.      Current Outpatient Medications  Medication Sig Dispense Refill  . [START ON 05/11/2020] medroxyPROGESTERone (PROVERA) 10 MG tablet Take 1 tablet (10 mg total) by mouth daily for 10 days. Start on 1st day of period. Continue for 10 days. Repeat each month during period. 30 tablet 1   No current facility-administered medications for this visit.   Allergies: Patient has no known allergies.  Patient's last menstrual period was 04/12/2020.  Past medical history,surgical history, problem list, medications, allergies, family history and social history were all reviewed and documented as reviewed in the EPIC chart.   OBJECTIVE:  LMP 04/12/2020  The patient appears well, alert, oriented, in no distress. Pelvic exam deferred as this was performed at last visit  Pelvic ultrasound Anteverted uterus 9.4 x 6.2 x 3.5 cm 3D imaging suggests bicornuate morphology of the uterus Endometrium thickness and right horn 12.5 mm, left horn 15.3 mm No myometrial masses. Bilateral ovaries are normal with normal follicular pattern. No adnexal masses.  No free fluid.   ASSESSMENT:  24 y.o. G1P0100 here for a pelvic ultrasound abnormal uterine bleeding, bicornuate uterus  PLAN:  1.  Bicornuate uterus.  Not the main reason why she is here today but again, we reviewed her pregnancy history with an adverse outcome involving a pregnancy loss/IUFD at 26 weeks.  I highly encouraged her to consult with a reproductive endocrinology specialist prior to conceiving again.  She is interested in trying to conceive and says she has been trying for a year.  I offered to help her with a referral when  she is ready.  Today's ultrasound imaging appears to be more convincing of bicornuate uterine morphology.    2.  Abnormal uterine bleeding.  We decided not to do the sonohysterogram today since she has a bicornuate uterus and this would not have provided useful information for evaluation of each uterine horn the presence of polyps.  She has no evidence of fibroids.  As she is hoping to conceive in the near future I discussed her options to include cyclic Provera versus tranexamic acid.  I will have her start on Provera 10 mg daily starting with the onset of her period and continuing for 10 days.  She will do this each month on her period.  I sent her a prescription.  She will follow up with me if she is having ongoing heavy menstrual concerns.  She was asking about FMLA for her workplace when she is on her period until menstruation is more controlled.  She will plan to bring the paperwork and so we can fill that out.  Theresia Majors MD 04/27/20

## 2020-06-06 ENCOUNTER — Other Ambulatory Visit: Payer: Self-pay | Admitting: Obstetrics and Gynecology

## 2020-06-06 NOTE — Telephone Encounter (Signed)
Left voice mail with pharmacy that Dr. Penni Bombard just sent this Rx on 05/11/20. I asked them to confirm they received it and I will refuse this refill. If they do not have it I will okay this and resend it.

## 2020-09-27 ENCOUNTER — Telehealth: Payer: Self-pay

## 2020-09-27 NOTE — Telephone Encounter (Signed)
I spoke with patient and recommend she go to [facility] for evaluation. I explained that they can provide pain meds, radiology services if indicated, etc. And she will be better served there than in the office setting. She agreed.

## 2020-09-27 NOTE — Telephone Encounter (Signed)
Please have patient go to the Emergency Department now for evaluation.

## 2020-09-27 NOTE — Telephone Encounter (Signed)
Patient called to schedule appointment for severe pain and she was transferred to triage. She rates Pain on scale at 7.  She said her menses started yesterday a week early and pain started then.  She is having bad pain in her back and abdomen.  No fever, no UTI sx. She is sexually active with no birth control.

## 2020-09-28 ENCOUNTER — Ambulatory Visit (INDEPENDENT_AMBULATORY_CARE_PROVIDER_SITE_OTHER): Payer: BC Managed Care – PPO | Admitting: Family Medicine

## 2020-09-28 ENCOUNTER — Other Ambulatory Visit: Payer: Self-pay

## 2020-09-28 ENCOUNTER — Encounter: Payer: Self-pay | Admitting: Family Medicine

## 2020-09-28 VITALS — BP 110/76 | Temp 98.3°F | Ht 67.0 in | Wt 134.4 lb

## 2020-09-28 DIAGNOSIS — M545 Low back pain, unspecified: Secondary | ICD-10-CM

## 2020-09-28 DIAGNOSIS — Z113 Encounter for screening for infections with a predominantly sexual mode of transmission: Secondary | ICD-10-CM | POA: Diagnosis not present

## 2020-09-28 LAB — CBC WITH DIFFERENTIAL/PLATELET
Basophils Absolute: 0 10*3/uL (ref 0.0–0.1)
Basophils Relative: 0.2 % (ref 0.0–3.0)
Eosinophils Absolute: 0 10*3/uL (ref 0.0–0.7)
Eosinophils Relative: 0.6 % (ref 0.0–5.0)
HCT: 41.7 % (ref 36.0–46.0)
Hemoglobin: 13.7 g/dL (ref 12.0–15.0)
Lymphocytes Relative: 18.8 % (ref 12.0–46.0)
Lymphs Abs: 0.9 10*3/uL (ref 0.7–4.0)
MCHC: 32.9 g/dL (ref 30.0–36.0)
MCV: 87 fl (ref 78.0–100.0)
Monocytes Absolute: 0.5 10*3/uL (ref 0.1–1.0)
Monocytes Relative: 11.5 % (ref 3.0–12.0)
Neutro Abs: 3.2 10*3/uL (ref 1.4–7.7)
Neutrophils Relative %: 68.9 % (ref 43.0–77.0)
Platelets: 225 10*3/uL (ref 150.0–400.0)
RBC: 4.79 Mil/uL (ref 3.87–5.11)
RDW: 13.3 % (ref 11.5–15.5)
WBC: 4.7 10*3/uL (ref 4.0–10.5)

## 2020-09-28 LAB — POCT URINALYSIS DIPSTICK
Bilirubin, UA: NEGATIVE
Glucose, UA: NEGATIVE
Ketones, UA: NEGATIVE
Nitrite, UA: NEGATIVE
Protein, UA: POSITIVE — AB
Spec Grav, UA: 1.015 (ref 1.010–1.025)
Urobilinogen, UA: 0.2 E.U./dL
pH, UA: 6 (ref 5.0–8.0)

## 2020-09-28 LAB — COMPREHENSIVE METABOLIC PANEL
ALT: 15 U/L (ref 0–35)
AST: 15 U/L (ref 0–37)
Albumin: 4.3 g/dL (ref 3.5–5.2)
Alkaline Phosphatase: 42 U/L (ref 39–117)
BUN: 6 mg/dL (ref 6–23)
CO2: 30 mEq/L (ref 19–32)
Calcium: 9.8 mg/dL (ref 8.4–10.5)
Chloride: 100 mEq/L (ref 96–112)
Creatinine, Ser: 0.81 mg/dL (ref 0.40–1.20)
GFR: 100.35 mL/min (ref >60.00)
Glucose, Bld: 85 mg/dL (ref 70–99)
Potassium: 3.8 mEq/L (ref 3.5–5.1)
Sodium: 135 mEq/L (ref 135–145)
Total Bilirubin: 0.5 mg/dL (ref 0.2–1.2)
Total Protein: 8.3 g/dL (ref 6.0–8.3)

## 2020-09-28 LAB — POCT URINE PREGNANCY: Preg Test, Ur: NEGATIVE

## 2020-09-28 NOTE — Progress Notes (Signed)
Low back pain for the last 2 days. Right side of lower back. Period came on around the same time and has been very painful since loss of her child.   No known injuries. Stabbing pain originating in the back and Patient states she feels a pulling in the lower abdomen as well.

## 2020-09-28 NOTE — Progress Notes (Signed)
Subjective:    Patient ID: Veronica Mclean, female    DOB: May 02, 1996, 25 y.o.   MRN: [id number]  Chief Complaint  Patient presents with  . Back Pain    HPI Patient was seen today for acute concern.  Patient endorses sharp, stabbing low back pain x2 days.  Patient also endorses nausea, early menses, blood on toilet tissue a few days ago, and incomplete bladder emptying.  Patient notes a history of increased pain menses s/p IUFD at 26 wks in 2019.  Patient followed by OB/GYN.  Pt denies fever, emesis, vomiting.  Past Medical History:  Diagnosis Date  . Chlamydia 09/2010, 01/2011, 12/2014  . Gonorrhea 09/2010, 12/2014  . HSV infection     No Known Allergies  ROS General: Denies fever, chills, night sweats, changes in weight, changes in appetite HEENT: Denies headaches, ear pain, changes in vision, rhinorrhea, sore throat CV: Denies CP, palpitations, SOB, orthopnea Pulm: Denies SOB, cough, wheezing GI: Denies abdominal pain, nausea, vomiting, diarrhea, constipation  +N GU: Denies dysuria, hematuria, frequency, vaginal discharge +AUB, incomplete bladder emptying. Msk: Denies muscle cramps, joint pains  +back pain Neuro: Denies weakness, numbness, tingling Skin: Denies rashes, bruising Psych: Denies depression, anxiety, hallucinations    Objective:    Blood pressure 110/76, temperature 98.3 F (36.8 C), temperature source Oral, height 5\' 7"  (1.702 m), weight 134 lb 6.4 oz (61 kg), last menstrual period 09/26/2020.  Gen. Pleasant, well-nourished, in no distress, normal affect   HEENT: [place]/AT, face symmetric, conjunctiva clear, no scleral icterus, PERRLA, EOMI, nares patent without drainage Lungs: no accessory muscle use, CTAB, no wheezes or rales Cardiovascular: RRR, no m/r/g, no peripheral edema Abdomen: BS present, soft, NT/ND, no hepatosplenomegaly. Musculoskeletal: TTP of R lumbar paraspinal muscles.  No deformities, no cyanosis or clubbing, normal tone Neuro:  A&Ox3, CN  II-XII intact, normal gait Skin:  Warm, no lesions/ rash   Wt Readings from Last 3 Encounters:  09/28/20 134 lb 6.4 oz (61 kg)  02/29/20 142 lb (64.4 kg)  11/03/19 134 lb (60.8 kg)    Lab Results  Component Value Date   WBC 3.7 (L) 02/29/2020   HGB 12.5 02/29/2020   HCT 38.4 02/29/2020   PLT 196 02/29/2020   GLUCOSE 103 (H) 02/29/2020   CHOL 142 04/30/2019   TRIG 33.0 04/30/2019   HDL 90.10 04/30/2019   LDLCALC 45 04/30/2019   ALT 16 04/30/2019   AST 15 04/30/2019   NA 138 02/29/2020   K 4.9 02/29/2020   CL 103 02/29/2020   CREATININE 0.84 02/29/2020   BUN 19 02/29/2020   CO2 29 02/29/2020   TSH 1.78 11/03/2019   INR 0.94 12/03/2017   HGBA1C 4.9 12/03/2017    Assessment/Plan:  Acute right-sided low back pain without sciatica  -discussed possible causes  -UhCG negative -UA with protein, 3+ RBCs, 1+leuks -further recs based on labs -given strict precautions - Plan: POCT urinalysis dipstick, POCT urine pregnancy, CBC with Differential/Platelet, Comprehensive metabolic panel, C. trachomatis/N. gonorrhoeae RNA  F/u prn with pcp and OB/Gyn  12/05/2017, MD

## 2020-09-29 ENCOUNTER — Other Ambulatory Visit: Payer: Self-pay | Admitting: Family Medicine

## 2020-09-29 DIAGNOSIS — R8290 Unspecified abnormal findings in urine: Secondary | ICD-10-CM

## 2020-09-29 LAB — C. TRACHOMATIS/N. GONORRHOEAE RNA
C. trachomatis RNA, TMA: NOT DETECTED
N. gonorrhoeae RNA, TMA: NOT DETECTED

## 2020-09-29 MED ORDER — NITROFURANTOIN MONOHYD MACRO 100 MG PO CAPS: 100.0000 mg | ORAL_CAPSULE | Freq: Two times a day (BID) | ORAL | 0 refills | Status: DC

## 2020-10-02 ENCOUNTER — Telehealth: Payer: Self-pay | Admitting: Adult Health

## 2020-10-02 NOTE — Telephone Encounter (Signed)
Pt calling would like test results from when she saw Dr. Salomon Fick on 09/28/2020. Please call [phone]

## 2020-10-03 NOTE — Progress Notes (Signed)
Spoke with pt, is aware of results. Made appointment with pcp.

## 2020-10-03 NOTE — Telephone Encounter (Signed)
Patient stated she just talked with someone and was given the results.

## 2020-10-04 ENCOUNTER — Other Ambulatory Visit: Payer: Self-pay

## 2020-10-04 ENCOUNTER — Encounter: Payer: Self-pay | Admitting: Adult Health

## 2020-10-04 ENCOUNTER — Ambulatory Visit (INDEPENDENT_AMBULATORY_CARE_PROVIDER_SITE_OTHER): Payer: BC Managed Care – PPO | Admitting: Adult Health

## 2020-10-04 VITALS — BP 90/60 | HR 79 | Temp 98.8°F | Ht 67.0 in | Wt 132.6 lb

## 2020-10-04 DIAGNOSIS — M545 Low back pain, unspecified: Secondary | ICD-10-CM

## 2020-10-04 DIAGNOSIS — N926 Irregular menstruation, unspecified: Secondary | ICD-10-CM | POA: Diagnosis not present

## 2020-10-04 NOTE — Progress Notes (Signed)
Subjective:    Patient ID: Veronica Mclean, female    DOB: 1995-08-29, 25 y.o.   MRN: [id number]  HPI 25 year old female who  has a past medical history of Chlamydia (09/2010, 01/2011, 12/2014), Gonorrhea (09/2010, 12/2014), and HSV infection.  She presents to the office today for follow up.   She was seen 6 days ago by another provider in the office for acute right-sided low back pain without sciatica..  Came on around the same time as the low back pain.  Blood work including CMP, CBC were reassuring.  Urinalysis showed leuks.  She was sent in a prescription for Macrobid, but just found out today that this was sent to the pharmacy so she has not started it yet.  She continues to have right-sided flank pain with some pelvic pressure.  She denies dysuria or frequency.  Additionally, she does have a history of abnormal uterine bleeding and heavy periods.  Was being seen by Dr. Penni Bombard, but he has left the practice.  Has an appointment tomorrow with her new provider.  Last seen by GYN in October she was started on Provera 10 mg daily starting with the onset of her period and continuing for 10 days.  She reports that she stopped taking this medication as she did not like the way it made her feel, reports that it made her feel cold.  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 Bilateral ovaries are normal with normal follicular pattern. No adnexal masses.  No free fluid.  Today she reports that she needs FMLA paperwork completed so she can keep her job.  Her periods have been heavier as of recently, has been on her period for the last 9 days.   Review of Systems See HPI   Past Medical History:  Diagnosis Date  . Chlamydia 09/2010, 01/2011, 12/2014  . Gonorrhea 09/2010,  12/2014  . HSV infection     Social History   Socioeconomic History  . Marital status: Single    Spouse name: Not on file  . Number of children: Not on file  . Years of education: Not on file  . Highest education level: Not on file  Occupational History  . Not on file  Tobacco Use  . Smoking status: Current Every Day Smoker    Types: Cigars    Start date: 01/27/2019  . Smokeless tobacco: Never Used  . Tobacco comment: 1-2 black and milds per day  Vaping Use  . Vaping Use: Never used  Substance and Sexual Activity  . Alcohol use: Yes    Alcohol/week: 0.0 standard drinks    Comment: Social  . Drug use: Yes    Types: Marijuana  . Sexual activity: Yes    Birth control/protection: Condom    Comment: 1st intercourse 47 yo-5 partners  Other Topics Concern  . Not on file  Social History Narrative   [employer]   Works at [employer].      Social Determinants of Health   Financial Resource Strain: Not on file  Food Insecurity: Not on file  Transportation Needs: Not on file  Physical Activity: Not on file  Stress: Not on file  Social Connections: Not on file  Intimate Partner  Violence: Not on file    Past Surgical History:  Procedure Laterality Date  . NO PAST SURGERIES      Family History  Problem Relation Age of Onset  . Hypertension Mother   . Healthy Father     No Known Allergies  Current Outpatient Medications on File Prior to Visit  Medication Sig Dispense Refill  . medroxyPROGESTERone (PROVERA) 10 MG tablet Take 1 tablet (10 mg total) by mouth daily for 10 days. Start on 1st day of period. Continue for 10 days. Repeat each month during period. (Patient not taking: Reported on 09/28/2020) 30 tablet 1  . nitrofurantoin, macrocrystal-monohydrate, (MACROBID) 100 MG capsule Take 1 capsule (100 mg total) by mouth 2 (two) times daily. (Patient not taking: Reported on 10/04/2020) 10 capsule 0   No current facility-administered medications on  file prior to visit.    BP 90/60   Pulse 79   Temp 98.8 F (37.1 C) (Oral)   Ht 5\' 7"  (1.702 m)   Wt 132 lb 9.6 oz (60.1 kg)   LMP 09/26/2020 (Exact Date)   SpO2 (!) 89% Comment: put on toe/nails were to long ng  BMI 20.77 kg/m       Objective:   Physical Exam Vitals and nursing note reviewed.  Constitutional:      Appearance: Normal appearance.  Cardiovascular:     Rate and Rhythm: Normal rate and regular rhythm.     Heart sounds: Normal heart sounds.  Pulmonary:     Effort: Pulmonary effort is normal.     Breath sounds: Normal breath sounds.  Abdominal:     General: Abdomen is flat. Bowel sounds are normal.     Palpations: Abdomen is soft.     Tenderness: There is abdominal tenderness (right flank ). There is no right CVA tenderness or left CVA tenderness.  Musculoskeletal:        General: Normal range of motion.  Skin:    General: Skin is warm and dry.  Neurological:     General: No focal deficit present.     Mental Status: She is alert and oriented to person, place, and time.  Psychiatric:        Mood and Affect: Mood normal.        Behavior: Behavior normal.        Thought Content: Thought content normal.        Judgment: Judgment normal.       Assessment & Plan:  1. Acute right-sided low back pain without sciatica -Encouraged to start antibiotics.  Follow-up with GYN.  We will fill out her FMLA paperwork this time, but additional FMLA will need to be done by GYN for this issue.  2. Abnormal menstrual cycle -Follow-up with GYN tomorrow  11/26/2020, NP

## 2020-10-05 ENCOUNTER — Telehealth: Payer: Self-pay | Admitting: Nurse Practitioner

## 2020-10-05 ENCOUNTER — Ambulatory Visit (INDEPENDENT_AMBULATORY_CARE_PROVIDER_SITE_OTHER): Payer: BC Managed Care – PPO | Admitting: Nurse Practitioner

## 2020-10-05 ENCOUNTER — Encounter: Payer: Self-pay | Admitting: Nurse Practitioner

## 2020-10-05 VITALS — BP 118/68 | HR 76 | Wt 131.0 lb

## 2020-10-05 DIAGNOSIS — Q513 Bicornate uterus: Secondary | ICD-10-CM | POA: Diagnosis not present

## 2020-10-05 DIAGNOSIS — N939 Abnormal uterine and vaginal bleeding, unspecified: Secondary | ICD-10-CM | POA: Diagnosis not present

## 2020-10-05 DIAGNOSIS — Z8759 Personal history of other complications of pregnancy, childbirth and the puerperium: Secondary | ICD-10-CM

## 2020-10-05 MED ORDER — MEDROXYPROGESTERONE ACETATE 10 MG PO TABS: 10.0000 mg | ORAL_TABLET | Freq: Every day | ORAL | 1 refills | Status: DC

## 2020-10-05 NOTE — Progress Notes (Signed)
   Acute Office Visit  Subjective:    Patient ID: Veronica Mclean, female    DOB: [birth year], 25 y.o.   MRN: [id number]   HPI 25 y.o. presents today for dysmenorrhea and heavy menstrual bleeding.  She was seen elsewhere for abdominal pain and treated for suspected UTI about a week ago but she did not start antibiotic as she feels the pain was not UTI related but instead from her menses because she was bleeding at the time. History of abnormal uterine bleeding, menorrhagia. She does not want an exam today. She has been seen for heavy, prolonged bleeding in the past. Cycles are monthly but bleeding is prolonged. 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 At that time it was recommended she do cyclic Provera days 1-10 of cycle as she has plans to try to conceive. Pregnancy loss/IUFD at 26 weeks in 2019.    Review of Systems  Constitutional: Negative.   Gastrointestinal: Positive for abdominal pain. Negative for nausea and vomiting.  Genitourinary: Positive for menstrual problem. Negative for dysuria, flank pain, frequency, hematuria, urgency, vaginal bleeding and vaginal discharge.       Objective:    Physical Exam Constitutional:      Appearance: Normal appearance.  Abdominal:     Tenderness: There is no abdominal tenderness. There is no right CVA tenderness or left CVA tenderness.   GU: declined  BP 118/68   Pulse 76   Wt 131 lb (59.4 kg)   LMP 09/26/2020 (Exact Date)   BMI 20.52 kg/m  Wt Readings from Last 3 Encounters:  10/05/20 131 lb (59.4 kg)  10/04/20 132 lb 9.6 oz (60.1 kg)  09/28/20 134 lb 6.4 oz (61 kg)        Assessment & Plan:   Problem List Items Addressed This Visit   None   Visit Diagnoses    Abnormal uterine bleeding    -  Primary   Relevant Medications   medroxyPROGESTERone (PROVERA) 10 MG tablet   Bicornuate uterus       History of IUFD         Plan: We discussed the causes of heavy, prolonged menstrual  bleeding and management options to include hormonal contraception versus cyclic progestins. She is aware endometriosis is diagnosed by laparoscopic surgery. She is wanting to conceive, so we agree to continue Provera cycle days 1-10 for now. Because of her history of IFUD it is recommended she see a fetal medicine specialist prior to conceiving. She is agreeable to plan.     Olivia Mackie DNP, 11:29 AM 10/05/2020

## 2020-10-05 NOTE — Telephone Encounter (Signed)
Attempted to call patient to discuss plan in full detail but home and cell does not have VM set up.

## 2020-10-09 ENCOUNTER — Telehealth: Payer: Self-pay | Admitting: Nurse Practitioner

## 2020-10-09 NOTE — Telephone Encounter (Signed)
Attempted again to reach patient to discuss plan of care. No answer and no VM set up.

## 2020-10-11 ENCOUNTER — Telehealth: Payer: Self-pay | Admitting: *Deleted

## 2020-10-11 DIAGNOSIS — Q513 Bicornate uterus: Secondary | ICD-10-CM

## 2020-10-11 DIAGNOSIS — Z8759 Personal history of other complications of pregnancy, childbirth and the puerperium: Secondary | ICD-10-CM

## 2020-10-11 NOTE — Telephone Encounter (Signed)
Referral placed at [facility] they will call patient to schedule.

## 2020-10-11 NOTE — Telephone Encounter (Signed)
-----   Message from Veronica Mackie, NP sent at 10/05/2020 11:29 AM EDT ----- Please send referral for fetal medicine specialist. Patient has history of pregnancy loss at 26 weeks due to intrauterine fetal demise. I also attempted to call patient's cell and home phone to discuss plan but no VM is set up and cell does not ring.

## 2020-10-12 ENCOUNTER — Telehealth: Payer: Self-pay | Admitting: Adult Health

## 2020-10-12 ENCOUNTER — Encounter: Payer: Self-pay | Admitting: Family Medicine

## 2020-10-12 NOTE — Telephone Encounter (Signed)
This really needs to be filled out by GYN since they are treating her

## 2020-10-12 NOTE — Telephone Encounter (Signed)
[employer] faxed a Disability form for completion  Fax to: [phone]  Disposition: Dr's Folder

## 2020-10-12 NOTE — Telephone Encounter (Signed)
Form has been placed in the Red folder at [facility].

## 2020-10-12 NOTE — Telephone Encounter (Signed)
Spoke with the patient. She is aware that GYN needs to complete this and will come pick up the forms.

## 2020-10-17 NOTE — Telephone Encounter (Signed)
Patient voicemail is not set up to discuss the below.

## 2020-11-01 NOTE — Telephone Encounter (Signed)
Encounter will be closed patient has not returned the phone call to discuss referral and schedule.

## 2020-11-02 NOTE — Telephone Encounter (Signed)
Forms were received again via fax from [place] on 4/13.  Spoke with the pts mother and advised her the forms will be left at the front desk for pick up as per PCP the forms should be given to her GYN.

## 2020-12-22 DIAGNOSIS — K047 Periapical abscess without sinus: Secondary | ICD-10-CM | POA: Diagnosis not present

## 2020-12-27 DIAGNOSIS — N925 Other specified irregular menstruation: Secondary | ICD-10-CM | POA: Diagnosis not present

## 2021-01-02 DIAGNOSIS — Z113 Encounter for screening for infections with a predominantly sexual mode of transmission: Secondary | ICD-10-CM | POA: Diagnosis not present

## 2021-01-02 DIAGNOSIS — N925 Other specified irregular menstruation: Secondary | ICD-10-CM | POA: Diagnosis not present

## 2021-01-02 DIAGNOSIS — Z124 Encounter for screening for malignant neoplasm of cervix: Secondary | ICD-10-CM | POA: Diagnosis not present

## 2021-01-02 DIAGNOSIS — N898 Other specified noninflammatory disorders of vagina: Secondary | ICD-10-CM | POA: Diagnosis not present

## 2021-01-02 LAB — HM PAP SMEAR: HM Pap smear: NORMAL

## 2021-01-03 ENCOUNTER — Encounter (HOSPITAL_COMMUNITY): Payer: Self-pay | Admitting: Obstetrics & Gynecology

## 2021-01-03 ENCOUNTER — Other Ambulatory Visit: Payer: Self-pay

## 2021-01-03 ENCOUNTER — Inpatient Hospital Stay (HOSPITAL_COMMUNITY)
Admission: AD | Admit: 2021-01-03 | Discharge: 2021-01-03 | Disposition: A | Discharge status: Home / Self Care | Payer: BC Managed Care – PPO | Attending: Obstetrics & Gynecology | Admitting: Obstetrics & Gynecology

## 2021-01-03 DIAGNOSIS — Z87891 Personal history of nicotine dependence: Secondary | ICD-10-CM | POA: Insufficient documentation

## 2021-01-03 DIAGNOSIS — O26851 Spotting complicating pregnancy, first trimester: Secondary | ICD-10-CM

## 2021-01-03 DIAGNOSIS — O209 Hemorrhage in early pregnancy, unspecified: Secondary | ICD-10-CM | POA: Diagnosis not present

## 2021-01-03 DIAGNOSIS — Z3A01 Less than 8 weeks gestation of pregnancy: Secondary | ICD-10-CM | POA: Diagnosis not present

## 2021-01-03 DIAGNOSIS — Z3490 Encounter for supervision of normal pregnancy, unspecified, unspecified trimester: Secondary | ICD-10-CM

## 2021-01-03 HISTORY — DX: Unspecified infectious disease: B99.9

## 2021-01-03 LAB — CBC
HCT: 34.5 % — ABNORMAL LOW (ref 36.0–46.0)
Hemoglobin: 11.6 g/dL — ABNORMAL LOW (ref 12.0–15.0)
MCH: 28.9 pg (ref 26.0–34.0)
MCHC: 33.6 g/dL (ref 30.0–36.0)
MCV: 85.8 fL (ref 80.0–100.0)
Platelets: 209 10*3/uL (ref 150–400)
RBC: 4.02 MIL/uL (ref 3.87–5.11)
RDW: 13.4 % (ref 11.5–15.5)
WBC: 3.9 10*3/uL — ABNORMAL LOW (ref 4.0–10.5)
nRBC: 0 % (ref 0.0–0.2)

## 2021-01-03 NOTE — Discharge Instructions (Signed)

## 2021-01-03 NOTE — MAU Note (Addendum)
Had some cramping last night at work, left early .  Today she had some spotting.  Had a pap smear yesterday. Pt is pregnant, hx of stillbirth- so is very nervous.  Started meds today for BV  had TV US on 6/8(gest sac/yolk sac per note on care everywhere), f/u is 6/27. [facility]. Was told yesterday she is probably 5 wks preg, had told them her LMP was 4/1, but later realized it was probably mid to late April- missed May period.

## 2021-01-03 NOTE — MAU Provider Note (Signed)
History     CSN: [id number]  Arrival date and time: 01/03/21 1342  Event Date/Time  First Provider Initiated Contact with Patient 01/03/21 1425     Chief Complaint  Patient presents with   Vaginal Bleeding   Abdominal Pain   HPI Veronica Mclean is a 25 y.o. G2P0100 with confirmed IUP at [redacted]w[redacted]d who presents to MAU with chief complaints of suprapubic cramping and vaginal spotting.   Abdominal pain This is a new complaint, onset last night. Pain score 3/10. Patient's abdominal pain does not radiate.  She has not taken medication or tried other treatments for this complaint.   Vaginal spotting Patient also c/o vaginal spotting. This is a new problem, onset this morning. Patient denies heavy vaginal bleeding. She has not needed to don a pad and is not seeing blood on her underwear or other clothing.  Patient is s/p pap smear with her PCP yesterday. She was prescribed medication for BV.   She is scheduled to initiate prenatal care with [facility] 06/27.  OB History     Gravida  2   Para  1   Term  0   Preterm  1   AB  0   Living  0      SAB  0   IAB  0   Ectopic  0   Multiple  0   Live Births  0           Past Medical History:  Diagnosis Date   Chlamydia 09/2010, 01/2011, 12/2014   Gonorrhea 09/2010, 12/2014   HSV infection    Infection    UTI    Past Surgical History:  Procedure Laterality Date   NO PAST SURGERIES      Family History  Problem Relation Age of Onset   Hypertension Mother    Kidney disease Father        kidney stones    Social History   Tobacco Use   Smoking status: Former    Pack years: 0.00    Types: Cigars    Start date: 01/27/2019   Smokeless tobacco: Never   Tobacco comments:    1-2 black and milds per day- stopped with  +preg  Vaping Use   Vaping Use: Former  Substance Use Topics   Alcohol use: Not Currently    Comment: Social   Drug use: Not Currently    Types: Marijuana    Allergies: No Known  Allergies  Medications Prior to Admission  Medication Sig Dispense Refill Last Dose   acetaminophen (TYLENOL) 325 MG tablet Take 650 mg by mouth every 6 (six) hours as needed.   Past Month   metroNIDAZOLE (FLAGYL) 500 MG tablet Take 500 mg by mouth 2 (two) times daily.   01/03/2021   Prenatal Vit-Fe Fumarate-FA (PRENATAL VITAMINS PO) Take by mouth.   01/03/2021   medroxyPROGESTERone (PROVERA) 10 MG tablet Take 1 tablet (10 mg total) by mouth daily for 10 days. Start on 1st day of period. Continue for 10 days. Repeat each month during period. 30 tablet 1    nitrofurantoin, macrocrystal-monohydrate, (MACROBID) 100 MG capsule Take 1 capsule (100 mg total) by mouth 2 (two) times daily. (Patient not taking: No sig reported) 10 capsule 0     Review of Systems  Gastrointestinal:  Positive for abdominal pain.  Genitourinary:  Positive for vaginal bleeding.  All other systems reviewed and are negative. Physical Exam   Blood pressure 105/61, pulse 91, temperature 98.9 F (37.2 C), resp. rate  17, height 5\' 7"  (1.702 m), weight 61.6 kg, last menstrual period 11/12/2020, SpO2 100 %.  Physical Exam Vitals and nursing note reviewed. Exam conducted with a chaperone present.  Constitutional:      Appearance: She is well-developed.  Cardiovascular:     Rate and Rhythm: Normal rate and regular rhythm.     Heart sounds: Normal heart sounds.  Pulmonary:     Effort: Pulmonary effort is normal.     Breath sounds: Normal breath sounds.  Abdominal:     Palpations: Abdomen is soft.     Tenderness: There is no abdominal tenderness.  Genitourinary:    Comments: Pelvic deferred due to report of scant bleeding and reassuring bedside ultrasound Neurological:     Mental Status: She is alert.    MAU Course  Procedures  --GS + YS visualized on formal ultrasound per PCP notes visible in Care Everywhere --GS + YS visualized on BSUS in MAU, performed by CNM --Patient already scheduled for viability scan in  conjunction with prenatal care --Vaginal swabs collected by PCP yesterday. Urine dip performed during that encounter and visible in Care Everywhere. Not performed today  Orders Placed This Encounter  Procedures   CBC   Discharge patient   Results for orders placed or performed during the hospital encounter of 01/03/21 (from the past 24 hour(s))  CBC     Status: Abnormal   Collection Time: 01/03/21  2:46 PM  Result Value Ref Range   WBC 3.9 (L) 4.0 - 10.5 K/uL   RBC 4.02 3.87 - 5.11 MIL/uL   Hemoglobin 11.6 (L) 12.0 - 15.0 g/dL   HCT 01/05/21 (L) 61.9 - 50.9 %   MCV 85.8 80.0 - 100.0 fL   MCH 28.9 26.0 - 34.0 pg   MCHC 33.6 30.0 - 36.0 g/dL   RDW 32.6 71.2 - 45.8 %   Platelets 209 150 - 400 K/uL   nRBC 0.0 0.0 - 0.2 %   Assessment and Plan  --25 y.o. G2P0100 with GS + YS on BSUS --Vaginal spotting likely associated with pap smear yesterday --Hgb 11.6 --Blood type B POS --Discharge home in stable condition  30, CNM 01/03/2021, 4:07 PM

## 2021-01-05 LAB — OB RESULTS CONSOLE GC/CHLAMYDIA
Chlamydia: NEGATIVE
Gonorrhea: NEGATIVE

## 2021-01-15 DIAGNOSIS — N925 Other specified irregular menstruation: Secondary | ICD-10-CM | POA: Diagnosis not present

## 2021-01-15 DIAGNOSIS — O3680X9 Pregnancy with inconclusive fetal viability, other fetus: Secondary | ICD-10-CM | POA: Diagnosis not present

## 2021-01-15 DIAGNOSIS — Z3A08 8 weeks gestation of pregnancy: Secondary | ICD-10-CM | POA: Diagnosis not present

## 2021-01-15 LAB — OB RESULTS CONSOLE HEPATITIS B SURFACE ANTIGEN: Hepatitis B Surface Ag: NEGATIVE

## 2021-01-15 LAB — HEPATITIS C ANTIBODY: HCV Ab: NEGATIVE

## 2021-01-15 LAB — OB RESULTS CONSOLE HIV ANTIBODY (ROUTINE TESTING): HIV: NONREACTIVE

## 2021-01-15 LAB — OB RESULTS CONSOLE RUBELLA ANTIBODY, IGM: Rubella: IMMUNE

## 2021-01-16 ENCOUNTER — Other Ambulatory Visit: Payer: Self-pay | Admitting: Obstetrics and Gynecology

## 2021-01-16 DIAGNOSIS — O3401 Maternal care for unspecified congenital malformation of uterus, first trimester: Secondary | ICD-10-CM

## 2021-01-16 DIAGNOSIS — Z363 Encounter for antenatal screening for malformations: Secondary | ICD-10-CM

## 2021-01-16 DIAGNOSIS — O26859 Spotting complicating pregnancy, unspecified trimester: Secondary | ICD-10-CM | POA: Diagnosis not present

## 2021-01-16 DIAGNOSIS — R350 Frequency of micturition: Secondary | ICD-10-CM | POA: Diagnosis not present

## 2021-01-16 DIAGNOSIS — Q5122 Other partial doubling of uterus: Secondary | ICD-10-CM | POA: Diagnosis not present

## 2021-01-16 DIAGNOSIS — Z3A08 8 weeks gestation of pregnancy: Secondary | ICD-10-CM

## 2021-02-05 DIAGNOSIS — Z3481 Encounter for supervision of other normal pregnancy, first trimester: Secondary | ICD-10-CM | POA: Diagnosis not present

## 2021-02-05 DIAGNOSIS — Z331 Pregnant state, incidental: Secondary | ICD-10-CM | POA: Diagnosis not present

## 2021-02-05 DIAGNOSIS — O219 Vomiting of pregnancy, unspecified: Secondary | ICD-10-CM | POA: Diagnosis not present

## 2021-02-06 ENCOUNTER — Ambulatory Visit: Payer: BC Managed Care – PPO

## 2021-02-13 DIAGNOSIS — O26899 Other specified pregnancy related conditions, unspecified trimester: Secondary | ICD-10-CM | POA: Diagnosis not present

## 2021-02-14 ENCOUNTER — Ambulatory Visit: Payer: BC Managed Care – PPO

## 2021-02-14 ENCOUNTER — Ambulatory Visit: Payer: BC Managed Care – PPO | Attending: Obstetrics and Gynecology

## 2021-02-27 ENCOUNTER — Encounter: Payer: Self-pay | Admitting: Adult Health

## 2021-03-20 ENCOUNTER — Other Ambulatory Visit: Payer: Self-pay

## 2021-03-20 ENCOUNTER — Encounter: Payer: Self-pay | Admitting: *Deleted

## 2021-03-20 ENCOUNTER — Ambulatory Visit (HOSPITAL_BASED_OUTPATIENT_CLINIC_OR_DEPARTMENT_OTHER): Payer: BC Managed Care – PPO | Admitting: Maternal & Fetal Medicine

## 2021-03-20 ENCOUNTER — Ambulatory Visit: Payer: BC Managed Care – PPO | Admitting: *Deleted

## 2021-03-20 ENCOUNTER — Other Ambulatory Visit: Payer: Self-pay | Admitting: *Deleted

## 2021-03-20 ENCOUNTER — Ambulatory Visit: Payer: BC Managed Care – PPO | Attending: Obstetrics and Gynecology

## 2021-03-20 VITALS — BP 110/53 | HR 93

## 2021-03-20 DIAGNOSIS — O34592 Maternal care for other abnormalities of gravid uterus, second trimester: Secondary | ICD-10-CM | POA: Diagnosis not present

## 2021-03-20 DIAGNOSIS — O3401 Maternal care for unspecified congenital malformation of uterus, first trimester: Secondary | ICD-10-CM | POA: Diagnosis not present

## 2021-03-20 DIAGNOSIS — O09292 Supervision of pregnancy with other poor reproductive or obstetric history, second trimester: Secondary | ICD-10-CM | POA: Insufficient documentation

## 2021-03-20 DIAGNOSIS — Q5128 Other doubling of uterus, other specified: Secondary | ICD-10-CM | POA: Insufficient documentation

## 2021-03-20 DIAGNOSIS — Q513 Bicornate uterus: Secondary | ICD-10-CM | POA: Diagnosis not present

## 2021-03-20 DIAGNOSIS — Z363 Encounter for antenatal screening for malformations: Secondary | ICD-10-CM | POA: Diagnosis not present

## 2021-03-20 DIAGNOSIS — O3402 Maternal care for unspecified congenital malformation of uterus, second trimester: Secondary | ICD-10-CM

## 2021-03-20 DIAGNOSIS — Z3A17 17 weeks gestation of pregnancy: Secondary | ICD-10-CM | POA: Insufficient documentation

## 2021-03-20 DIAGNOSIS — Z8759 Personal history of other complications of pregnancy, childbirth and the puerperium: Secondary | ICD-10-CM

## 2021-03-20 DIAGNOSIS — Z3689 Encounter for other specified antenatal screening: Secondary | ICD-10-CM

## 2021-03-20 DIAGNOSIS — Z3A08 8 weeks gestation of pregnancy: Secondary | ICD-10-CM | POA: Diagnosis not present

## 2021-03-20 NOTE — Progress Notes (Signed)
MFM consultation:  Veronica Mclean is a G2P0 who is here at 59 w 1 d in consultation for a bicornuate uterus and a history of anyhydramnios resulting in an IUFD at 26 weeks. She is seen a the request of Nigel Bridgeman.  Veronica Mclean notes that her pregnancy is overall going well. She denies s/sx of preterm labor. Her prenatal labs are normal. She had a low risk NIPS.  For further details of her history please see my epic consultation.  Regarding her pregnancy issues: 1) History of bicornuate uterus She has known of her bicornuate uterus since 2019. The uterus at that time did not appear heart shaped however, 3D imaging demonstrates septum to the internal os. There is one cervix but two cavities. The pregnancy is located on the left side cavity.  2) IUFD at 26 weeks.  Veronica Mclean, notes that she did not recall having loss of fluid or bleeding at the time. She did note that she had significant nausea and vomiting that subsided after delivery. Pathology report was not seen in EPIC.  Her records report that Veronica Mclean had a 102.6 fever and was treated with Unasyn. Thuse this is possible Preterm premature rupture with subsequent chorioamnionitis.  Today' exam revealed a single intrauterine pregnancy here for a detailed anatomy bicornuate uterus. Normal anatomy with measurements consistent with dates There is good fetal movement and amniotic fluid volume Suboptimal views of the fetal anatomy were obtained secondary to fetal position and early gestation.   Vitals with BMI 03/20/2021 01/03/2021 01/03/2021  Height - - 5\' 7"   Weight - - 135 lbs 13 oz  BMI - - 21.26  Systolic 110 107  Diastolic 53 56 61  Pulse 93 81 91   CBC Latest Ref Rng & Units 01/03/2021 09/28/2020 02/29/2020  WBC 4.0 - 10.5 K/uL 3.9(L) 4.7 3.7(L)  Hemoglobin 12.0 - 15.0 g/dL 11.6(L) 13.7 12.5  Hematocrit 36.0 - 46.0 % 34.5(L) 41.7 38.4  Platelets 150 - 400 K/uL 209 225.0 196   CMP Latest Ref Rng & Units 09/28/2020 02/29/2020  04/30/2019  Glucose 70 - 99 mg/dL 85 06/30/2019) 82  BUN 6 - 23 mg/dL 6 19 13   Creatinine 0.40 - 1.20 mg/dL 099(I 3.38  Sodium 135 - 145 mEq/L 135 138 138  Potassium 3.5 - 5.1 mEq/L 3.8 4.9 4.6  Chloride 96 - 112 mEq/L 100 103 103  CO2 19 - 32 mEq/L 30 29 29   Calcium 8.4 - 10.5 mg/dL 9.8 9.9 2.50  Total Protein 6.0 - 8.3 g/dL 8.3 - 7.3  Total Bilirubin 0.2 - 1.2 mg/dL 0.5 - 0.7  Alkaline Phos 39 - 117 U/L 42 - 31(L)  AST 0 - 37 U/L 15 - 15  ALT 0 - 35 U/L 15 - 16   OB History  Gravida Para Term Preterm AB Living  2 1 0 1 0 0  SAB IAB Ectopic Multiple Live Births  0 0 0 0 0    # Outcome Date GA Lbr Len/2nd Weight Sex Delivery Anes PTL Lv  2 Current           1 Preterm 12/04/17 [redacted]w[redacted]d 01:25 1 lb 2.9 oz (0.536 kg) F Vag-Spont None  FD     Name: Lillo,PENDINGBABY FD     Apgar1: 0  Apgar5: 0   Past Medical History:  Diagnosis Date   Chlamydia 09/2010, 01/2011, 12/2014   Gonorrhea 09/2010, 12/2014   HSV infection    Infection    UTI   Past  Surgical History:  Procedure Laterality Date   NO PAST SURGERIES     Family History  Problem Relation Age of Onset   Hypertension Mother    Kidney disease Father        kidney stones   Social History   Socioeconomic History   Marital status: Single    Spouse name: Not on file   Number of children: Not on file   Years of education: Not on file   Highest education level: Not on file  Occupational History   Not on file  Tobacco Use   Smoking status: Former    Types: Cigars    Start date: 01/27/2019   Smokeless tobacco: Never   Tobacco comments:    1-2 black and milds per day- stopped with  +preg  Vaping Use   Vaping Use: Former  Substance and Sexual Activity   Alcohol use: Not Currently    Comment: Social   Drug use: Not Currently    Types: Marijuana   Sexual activity: Not Currently    Birth control/protection: Condom    Comment: 1st intercourse 16 yo-5 partners  Other Topics Concern   Not on file  Social History Narrative    [employer]   Works at [employer].      Social Determinants of Health   Financial Resource Strain: Not on file  Food Insecurity: Not on file  Transportation Needs: Not on file  Physical Activity: Not on file  Stress: Not on file  Social Connections: Not on file  Intimate Partner Violence: Not on file       Current Outpatient Medications (Analgesics):    acetaminophen (TYLENOL) 325 MG tablet, Take 650 mg by mouth every 6 (six) hours as needed.   Current Outpatient Medications (Other):    nitrofurantoin, macrocrystal-monohydrate, (MACROBID) 100 MG capsule, Take 1 capsule (100 mg total) by mouth 2 (two) times daily. (Patient not taking: No sig reported)   Prenatal Vit-Fe Fumarate-FA (PRENATAL VITAMINS PO), Take by mouth.  No Known Allergies   Impression/Counseling  1) Bicornuate uterus A bicornuate uterus represents partial lack of fusion of the two mullerian ducts thereby resulting in a single cervix with a varying degree of separation in the two uterine horns. This anomaly is relatively common, and pregnancy outcome has usually been reported to be near normal. Some, however, find a high rate of early miscarriage, preterm labor and breech presentation.  Renal anomalies are found in 20 to 30 percent of women with mllerian defects. Therefore, all women with mllerian defects should undergo a radiologic renal investigation, such as an intravenous pyelogram or renal ultrasound. This can be done in the post-partum period.  2) IUFD at 26 weeks. I discussed with Veronica Mclean that the cause for the IUFD is still uncertain however, there is a suspecion for preterm premature rupture of membranes given her fever at the time of delivery. In addition, I discussed that this is likely a non recurrent cause if PPROM is the etiology. In addition, bicornuate uterus is associated with early miscarriage, PTL and malpresentation, but likely not IUFD.   3) Marginal cord  insertion: We observed a marginal cord insertion. I explained that this is defined as a placental cord insertion that is < 2 cm from the placental edge. In these cases there is an increased associated with small for gestational age. Therefore serial growth exams are recommended to trend growth.   Recommendations:  Follow up growth in  4 weeks with cervical length measurements. Veronica Mclean was counseled regarding s/sx of preterm labor.  I spent 60 minutes with > 50% in face to face consultation.  Novella Olive, MD.

## 2021-04-17 ENCOUNTER — Other Ambulatory Visit: Payer: Self-pay

## 2021-04-17 ENCOUNTER — Ambulatory Visit: Payer: BC Managed Care – PPO | Admitting: *Deleted

## 2021-04-17 ENCOUNTER — Ambulatory Visit: Payer: BC Managed Care – PPO | Attending: Obstetrics | Admitting: Obstetrics

## 2021-04-17 ENCOUNTER — Ambulatory Visit: Payer: BC Managed Care – PPO | Attending: Obstetrics and Gynecology

## 2021-04-17 ENCOUNTER — Other Ambulatory Visit: Payer: Self-pay | Admitting: *Deleted

## 2021-04-17 ENCOUNTER — Other Ambulatory Visit: Payer: Self-pay | Admitting: Maternal & Fetal Medicine

## 2021-04-17 VITALS — BP 108/47 | HR 78

## 2021-04-17 DIAGNOSIS — O358XX0 Maternal care for other (suspected) fetal abnormality and damage, not applicable or unspecified: Secondary | ICD-10-CM | POA: Diagnosis not present

## 2021-04-17 DIAGNOSIS — O09292 Supervision of pregnancy with other poor reproductive or obstetric history, second trimester: Secondary | ICD-10-CM | POA: Diagnosis not present

## 2021-04-17 DIAGNOSIS — O43199 Other malformation of placenta, unspecified trimester: Secondary | ICD-10-CM | POA: Insufficient documentation

## 2021-04-17 DIAGNOSIS — O3402 Maternal care for unspecified congenital malformation of uterus, second trimester: Secondary | ICD-10-CM | POA: Insufficient documentation

## 2021-04-17 DIAGNOSIS — Z8759 Personal history of other complications of pregnancy, childbirth and the puerperium: Secondary | ICD-10-CM

## 2021-04-17 DIAGNOSIS — Z3689 Encounter for other specified antenatal screening: Secondary | ICD-10-CM | POA: Diagnosis not present

## 2021-04-17 DIAGNOSIS — Q513 Bicornate uterus: Secondary | ICD-10-CM | POA: Diagnosis not present

## 2021-04-17 DIAGNOSIS — O35BXX0 Maternal care for other (suspected) fetal abnormality and damage, fetal cardiac anomalies, not applicable or unspecified: Secondary | ICD-10-CM

## 2021-04-17 DIAGNOSIS — Z3A21 21 weeks gestation of pregnancy: Secondary | ICD-10-CM

## 2021-04-17 NOTE — Progress Notes (Signed)
C/o" ? Leakage of fluid since last night, not sure if urine." Denies gush of fluid or continued leakage or trickling. Advised to notify OB MD.

## 2021-04-17 NOTE — Progress Notes (Signed)
MFM Note  Veronica Mclean seen for a detailed fetal anatomy scan due to a history of a bicornuate uterus and prior IUFD at 26 weeks.  Anhydramnios was noted prior to the fetal demise at 26 weeks.  She denies any other significant past medical history and denies any problems in her current pregnancy.    She had a cell free DNA test earlier in her pregnancy which indicated a low risk for trisomy 65, 47, and 13. A female fetus is predicted.   She was informed that the fetal growth and amniotic fluid level were appropriate for her gestational age.   On today's exam, an intracardiac echogenic focus was noted in the left and right ventricles of the fetal heart.  The small association between an echogenic focus and Down syndrome was discussed. Due to the echogenic focus noted today, the patient was offered and declined an amniocentesis today for definitive diagnosis of fetal aneuploidy.  She reports that she is comfortable with her negative cell free DNA test.  The patient was informed that anomalies may be missed due to technical limitations. If the fetus is in a suboptimal position or maternal habitus is increased, visualization of the fetus in the maternal uterus may be impaired.  A transvaginal ultrasound performed today due to her history of a prior loss showed a cervical length of 3.89 cm long without any signs of funneling.  The patient was reassured that her risk of a preterm birth is low due to the normal cervical length noted today.  Due to her bicornuate uterus and history of a prior fetal demise, we will continue to follow her with monthly growth ultrasounds.  Weekly fetal testing should be started in your office at around 30 weeks.    A follow-up growth scan was scheduled in 4 weeks.  A total of 20 minutes was spent counseling and coordinating the care for this patient.  Greater than 50% of the time was spent in direct face-to-face contact.

## 2021-05-18 ENCOUNTER — Ambulatory Visit: Payer: BC Managed Care – PPO | Admitting: *Deleted

## 2021-05-18 ENCOUNTER — Other Ambulatory Visit: Payer: Self-pay

## 2021-05-18 ENCOUNTER — Encounter: Payer: Self-pay | Admitting: *Deleted

## 2021-05-18 ENCOUNTER — Other Ambulatory Visit: Payer: Self-pay | Admitting: *Deleted

## 2021-05-18 ENCOUNTER — Other Ambulatory Visit: Payer: Self-pay | Admitting: Obstetrics

## 2021-05-18 ENCOUNTER — Ambulatory Visit: Payer: BC Managed Care – PPO | Attending: Obstetrics

## 2021-05-18 VITALS — BP 111/53 | HR 79

## 2021-05-18 DIAGNOSIS — Q513 Bicornate uterus: Secondary | ICD-10-CM | POA: Diagnosis not present

## 2021-05-18 DIAGNOSIS — Z8759 Personal history of other complications of pregnancy, childbirth and the puerperium: Secondary | ICD-10-CM

## 2021-05-18 DIAGNOSIS — Z3A25 25 weeks gestation of pregnancy: Secondary | ICD-10-CM | POA: Diagnosis not present

## 2021-05-18 DIAGNOSIS — O43192 Other malformation of placenta, second trimester: Secondary | ICD-10-CM

## 2021-05-18 DIAGNOSIS — O3402 Maternal care for unspecified congenital malformation of uterus, second trimester: Secondary | ICD-10-CM | POA: Diagnosis not present

## 2021-05-18 DIAGNOSIS — O09293 Supervision of pregnancy with other poor reproductive or obstetric history, third trimester: Secondary | ICD-10-CM | POA: Diagnosis not present

## 2021-05-22 DIAGNOSIS — Z8759 Personal history of other complications of pregnancy, childbirth and the puerperium: Secondary | ICD-10-CM | POA: Diagnosis not present

## 2021-05-22 DIAGNOSIS — Z3A26 26 weeks gestation of pregnancy: Secondary | ICD-10-CM | POA: Diagnosis not present

## 2021-05-31 DIAGNOSIS — Z369 Encounter for antenatal screening, unspecified: Secondary | ICD-10-CM | POA: Diagnosis not present

## 2021-06-19 ENCOUNTER — Other Ambulatory Visit: Payer: Self-pay

## 2021-06-19 ENCOUNTER — Ambulatory Visit: Payer: BC Managed Care – PPO | Admitting: *Deleted

## 2021-06-19 ENCOUNTER — Ambulatory Visit: Payer: BC Managed Care – PPO | Attending: Obstetrics

## 2021-06-19 VITALS — BP 123/47 | HR 79

## 2021-06-19 DIAGNOSIS — Z3A30 30 weeks gestation of pregnancy: Secondary | ICD-10-CM

## 2021-06-19 DIAGNOSIS — Q513 Bicornate uterus: Secondary | ICD-10-CM | POA: Diagnosis not present

## 2021-06-19 DIAGNOSIS — Z8759 Personal history of other complications of pregnancy, childbirth and the puerperium: Secondary | ICD-10-CM | POA: Insufficient documentation

## 2021-06-19 DIAGNOSIS — O3402 Maternal care for unspecified congenital malformation of uterus, second trimester: Secondary | ICD-10-CM | POA: Diagnosis not present

## 2021-06-19 DIAGNOSIS — O3400 Maternal care for unspecified congenital malformation of uterus, unspecified trimester: Secondary | ICD-10-CM | POA: Diagnosis not present

## 2021-06-19 DIAGNOSIS — O09293 Supervision of pregnancy with other poor reproductive or obstetric history, third trimester: Secondary | ICD-10-CM

## 2021-06-20 ENCOUNTER — Other Ambulatory Visit: Payer: Self-pay | Admitting: *Deleted

## 2021-06-20 DIAGNOSIS — O43199 Other malformation of placenta, unspecified trimester: Secondary | ICD-10-CM

## 2021-06-20 DIAGNOSIS — Z8759 Personal history of other complications of pregnancy, childbirth and the puerperium: Secondary | ICD-10-CM

## 2021-06-25 DIAGNOSIS — R8279 Other abnormal findings on microbiological examination of urine: Secondary | ICD-10-CM | POA: Diagnosis not present

## 2021-06-27 ENCOUNTER — Ambulatory Visit: Payer: BC Managed Care – PPO | Attending: Obstetrics

## 2021-06-27 ENCOUNTER — Encounter: Payer: Self-pay | Admitting: *Deleted

## 2021-06-27 ENCOUNTER — Ambulatory Visit: Payer: BC Managed Care – PPO | Admitting: *Deleted

## 2021-06-27 ENCOUNTER — Other Ambulatory Visit: Payer: Self-pay

## 2021-06-27 VITALS — BP 123/54 | HR 83

## 2021-06-27 DIAGNOSIS — O43199 Other malformation of placenta, unspecified trimester: Secondary | ICD-10-CM | POA: Diagnosis not present

## 2021-06-27 DIAGNOSIS — O09293 Supervision of pregnancy with other poor reproductive or obstetric history, third trimester: Secondary | ICD-10-CM

## 2021-06-27 DIAGNOSIS — Z8759 Personal history of other complications of pregnancy, childbirth and the puerperium: Secondary | ICD-10-CM | POA: Insufficient documentation

## 2021-06-27 DIAGNOSIS — O43193 Other malformation of placenta, third trimester: Secondary | ICD-10-CM

## 2021-06-27 DIAGNOSIS — Z3A31 31 weeks gestation of pregnancy: Secondary | ICD-10-CM

## 2021-07-04 ENCOUNTER — Encounter: Payer: Self-pay | Admitting: Adult Health

## 2021-07-05 ENCOUNTER — Other Ambulatory Visit: Payer: Self-pay | Admitting: *Deleted

## 2021-07-05 ENCOUNTER — Other Ambulatory Visit: Payer: Self-pay

## 2021-07-05 ENCOUNTER — Ambulatory Visit: Payer: BC Managed Care – PPO | Admitting: *Deleted

## 2021-07-05 ENCOUNTER — Encounter: Payer: Self-pay | Admitting: *Deleted

## 2021-07-05 ENCOUNTER — Ambulatory Visit: Payer: BC Managed Care – PPO | Attending: Obstetrics

## 2021-07-05 VITALS — BP 107/55 | HR 92

## 2021-07-05 DIAGNOSIS — O3403 Maternal care for unspecified congenital malformation of uterus, third trimester: Secondary | ICD-10-CM | POA: Diagnosis not present

## 2021-07-05 DIAGNOSIS — Z3A32 32 weeks gestation of pregnancy: Secondary | ICD-10-CM

## 2021-07-05 DIAGNOSIS — Q513 Bicornate uterus: Secondary | ICD-10-CM | POA: Insufficient documentation

## 2021-07-05 DIAGNOSIS — O43199 Other malformation of placenta, unspecified trimester: Secondary | ICD-10-CM

## 2021-07-05 DIAGNOSIS — Z8759 Personal history of other complications of pregnancy, childbirth and the puerperium: Secondary | ICD-10-CM

## 2021-07-05 DIAGNOSIS — O09293 Supervision of pregnancy with other poor reproductive or obstetric history, third trimester: Secondary | ICD-10-CM

## 2021-07-05 DIAGNOSIS — O34593 Maternal care for other abnormalities of gravid uterus, third trimester: Secondary | ICD-10-CM

## 2021-07-12 ENCOUNTER — Ambulatory Visit: Payer: BC Managed Care – PPO | Attending: Obstetrics

## 2021-07-12 ENCOUNTER — Ambulatory Visit: Payer: BC Managed Care – PPO

## 2021-07-19 ENCOUNTER — Ambulatory Visit: Payer: BC Managed Care – PPO | Admitting: *Deleted

## 2021-07-19 ENCOUNTER — Other Ambulatory Visit: Payer: Self-pay | Admitting: *Deleted

## 2021-07-19 ENCOUNTER — Ambulatory Visit: Payer: BC Managed Care – PPO | Attending: Obstetrics

## 2021-07-19 ENCOUNTER — Other Ambulatory Visit: Payer: Self-pay

## 2021-07-19 VITALS — BP 117/64 | HR 81

## 2021-07-19 DIAGNOSIS — O3403 Maternal care for unspecified congenital malformation of uterus, third trimester: Secondary | ICD-10-CM | POA: Diagnosis present

## 2021-07-19 DIAGNOSIS — O43193 Other malformation of placenta, third trimester: Secondary | ICD-10-CM

## 2021-07-19 DIAGNOSIS — Z3A34 34 weeks gestation of pregnancy: Secondary | ICD-10-CM

## 2021-07-19 DIAGNOSIS — O09293 Supervision of pregnancy with other poor reproductive or obstetric history, third trimester: Secondary | ICD-10-CM | POA: Diagnosis not present

## 2021-07-19 DIAGNOSIS — Z8759 Personal history of other complications of pregnancy, childbirth and the puerperium: Secondary | ICD-10-CM | POA: Insufficient documentation

## 2021-07-19 DIAGNOSIS — Q513 Bicornate uterus: Secondary | ICD-10-CM | POA: Insufficient documentation

## 2021-07-19 DIAGNOSIS — O43199 Other malformation of placenta, unspecified trimester: Secondary | ICD-10-CM | POA: Diagnosis not present

## 2021-07-22 NOTE — L&D Delivery Note (Signed)
Delivery Note   Patient Name: Zanetta Dehaan DOB: 29-Aug-1995 MRN: [id number]  Date of admission: 07/27/2021 Delivering MD: Dale [place]  Date of delivery: 07/28/21 Type of delivery: SVD  Newborn Data: Live born female  Birth Weight:   APGAR: 8, 9  Newborn Delivery   Birth date/time: 07/[birth year]/2021 17:07:00 Delivery type: Vaginal, Spontaneous     Arta Silence, 26 y.o., @ [redacted]w[redacted]d,  G2P0100, who was admitted for Samanthamarie Dafna Romo is a 26 y.o. female, G2P0100, IUP admitted at 35.4 weeks, currently 35.5 weeks presenting for PPROM over last couple days, heavier 1/6 @ 0500  with cxt with preterm labor now. Saw MFM today BPP 8/8. GBS unknown and pending, BMZ 1/6 given. 12/29 Growth, AFI 20.2  vertex, 4.15lb 23%, Pt endorse + Fm. Denies vaginal bleeding.. I was called to the room when she progressed 7cm and +1 station in the second stage of labor, pt felt urges to push, cat 2 strip noted with prolonged decel to nadir of 60 for 7 mins, dr dillard called pitocin turned off, BP low phenylephrine given with position change and bolus with amnio infusing, pt rapid became complete at 2+ station. NICU called ot be present for delivery.  She pushed for 10/min.  She delivered a viable infant, cephalic and restituted to the ROA position over an intact perineum.  A tight nuchal cord   was identified, and right compound hand, unable to reduce, newborn summersault through. The baby was placed on maternal abdomen while initial step of NRP were perfmored (Dry, Stimulated, and warmed). Hat placed on baby for thermoregulation. Delayed cord clamping was performed for 2 minutes.  Cord double clamped and cut.  Cord cut by pts mother. Apgar scores were 8 and 9. Newborn was then taken to radient warmer to be evaluated by NICU then newborn replaced on maternal chest. Prophylactic Pitocin was started in the third stage of labor for active management. The placenta delivered spontaneously, shultz, with a 3 vessel cord  and was sent to pathology.  Inspection revealed none. An examination of the vaginal vault and cervix was free from lacerations. The uterus was firm, bleeding stable.  Umbilical artery blood gas were not sent.  There were no complications during the procedure.  Mom and baby skin to skin following delivery. Left in stable condition.  Maternal Info: Anesthesia:Epidural Episiotomy: No Lacerations:  no Suture Repair: No Est. Blood Loss (mL):   Newborn Info:  Baby Sex: female Circumcision: in pt desired  APGAR (1 MIN): 8   APGAR (5 MINS): 9   APGAR (10 MINS):     Mom to postpartum.  Baby to Couplet care / Skin to Skin.  Dr Normand Sloop updated   [place], [place], NP-C 07/28/21 5:45 PM

## 2021-07-27 ENCOUNTER — Ambulatory Visit (HOSPITAL_BASED_OUTPATIENT_CLINIC_OR_DEPARTMENT_OTHER): Payer: BC Managed Care – PPO

## 2021-07-27 ENCOUNTER — Encounter (HOSPITAL_COMMUNITY): Payer: Self-pay | Admitting: Obstetrics and Gynecology

## 2021-07-27 ENCOUNTER — Inpatient Hospital Stay (HOSPITAL_COMMUNITY)
Admission: AD | Admit: 2021-07-27 | Discharge: 2021-07-30 | DRG: 807 | Disposition: A | Discharge status: Home / Self Care | Payer: BC Managed Care – PPO | Attending: Obstetrics & Gynecology | Admitting: Obstetrics & Gynecology

## 2021-07-27 ENCOUNTER — Ambulatory Visit: Payer: BC Managed Care – PPO | Admitting: *Deleted

## 2021-07-27 ENCOUNTER — Other Ambulatory Visit: Payer: Self-pay

## 2021-07-27 VITALS — BP 111/66 | HR 74

## 2021-07-27 DIAGNOSIS — Z87891 Personal history of nicotine dependence: Secondary | ICD-10-CM | POA: Diagnosis not present

## 2021-07-27 DIAGNOSIS — O322XX0 Maternal care for transverse and oblique lie, not applicable or unspecified: Secondary | ICD-10-CM | POA: Diagnosis present

## 2021-07-27 DIAGNOSIS — Q513 Bicornate uterus: Secondary | ICD-10-CM

## 2021-07-27 DIAGNOSIS — O43123 Velamentous insertion of umbilical cord, third trimester: Secondary | ICD-10-CM | POA: Diagnosis present

## 2021-07-27 DIAGNOSIS — D573 Sickle-cell trait: Secondary | ICD-10-CM | POA: Diagnosis present

## 2021-07-27 DIAGNOSIS — Q5128 Other doubling of uterus, other specified: Secondary | ICD-10-CM | POA: Diagnosis not present

## 2021-07-27 DIAGNOSIS — Z8759 Personal history of other complications of pregnancy, childbirth and the puerperium: Secondary | ICD-10-CM

## 2021-07-27 DIAGNOSIS — O09293 Supervision of pregnancy with other poor reproductive or obstetric history, third trimester: Secondary | ICD-10-CM

## 2021-07-27 DIAGNOSIS — O42013 Preterm premature rupture of membranes, onset of labor within 24 hours of rupture, third trimester: Secondary | ICD-10-CM

## 2021-07-27 DIAGNOSIS — O42919 Preterm premature rupture of membranes, unspecified as to length of time between rupture and onset of labor, unspecified trimester: Secondary | ICD-10-CM | POA: Diagnosis present

## 2021-07-27 DIAGNOSIS — O3403 Maternal care for unspecified congenital malformation of uterus, third trimester: Secondary | ICD-10-CM | POA: Insufficient documentation

## 2021-07-27 DIAGNOSIS — O9902 Anemia complicating childbirth: Secondary | ICD-10-CM | POA: Diagnosis present

## 2021-07-27 DIAGNOSIS — Z3A35 35 weeks gestation of pregnancy: Secondary | ICD-10-CM | POA: Diagnosis not present

## 2021-07-27 DIAGNOSIS — O42913 Preterm premature rupture of membranes, unspecified as to length of time between rupture and onset of labor, third trimester: Principal | ICD-10-CM | POA: Diagnosis present

## 2021-07-27 DIAGNOSIS — Z20822 Contact with and (suspected) exposure to covid-19: Secondary | ICD-10-CM | POA: Diagnosis present

## 2021-07-27 LAB — RESP PANEL BY RT-PCR (FLU A&B, COVID) ARPGX2
Influenza A by PCR: NEGATIVE
Influenza B by PCR: NEGATIVE
SARS Coronavirus 2 by RT PCR: NEGATIVE

## 2021-07-27 LAB — AMNISURE RUPTURE OF MEMBRANE (ROM) NOT AT ARMC: Amnisure ROM: POSITIVE

## 2021-07-27 LAB — CBC WITH DIFFERENTIAL/PLATELET
Abs Immature Granulocytes: 0.03 10*3/uL (ref 0.00–0.07)
Basophils Absolute: 0 10*3/uL (ref 0.0–0.1)
Basophils Relative: 0 %
Eosinophils Absolute: 0 10*3/uL (ref 0.0–0.5)
Eosinophils Relative: 0 %
HCT: 32.5 % — ABNORMAL LOW (ref 36.0–46.0)
Hemoglobin: 10.9 g/dL — ABNORMAL LOW (ref 12.0–15.0)
Immature Granulocytes: 0 %
Lymphocytes Relative: 19 %
Lymphs Abs: 1.9 10*3/uL (ref 0.7–4.0)
MCH: 28.7 pg (ref 26.0–34.0)
MCHC: 33.5 g/dL (ref 30.0–36.0)
MCV: 85.5 fL (ref 80.0–100.0)
Monocytes Absolute: 1 10*3/uL (ref 0.1–1.0)
Monocytes Relative: 10 %
Neutro Abs: 7.1 10*3/uL (ref 1.7–7.7)
Neutrophils Relative %: 71 %
Platelets: 189 10*3/uL (ref 150–400)
RBC: 3.8 MIL/uL — ABNORMAL LOW (ref 3.87–5.11)
RDW: 12.1 % (ref 11.5–15.5)
WBC: 10.1 10*3/uL (ref 4.0–10.5)
nRBC: 0 % (ref 0.0–0.2)

## 2021-07-27 LAB — TYPE AND SCREEN
ABO/RH(D): B POS
Antibody Screen: NEGATIVE

## 2021-07-27 MED ORDER — OXYCODONE-ACETAMINOPHEN 5-325 MG PO TABS: 1.0000 | ORAL_TABLET | ORAL | Status: DC | PRN

## 2021-07-27 MED ORDER — SOD CITRATE-CITRIC ACID 500-334 MG/5ML PO SOLN: 30.0000 mL | ORAL | Status: DC | PRN

## 2021-07-27 MED ORDER — FENTANYL CITRATE (PF) 100 MCG/2ML IJ SOLN
50.0000 ug | INTRAMUSCULAR | Status: DC | PRN
  Administered 2021-07-28: 50 ug via INTRAVENOUS
  Filled 2021-07-27: qty 2

## 2021-07-27 MED ORDER — PENICILLIN G POT IN DEXTROSE 60000 UNIT/ML IV SOLN: 3.0000 10*6.[IU] | INTRAVENOUS | Status: DC

## 2021-07-27 MED ORDER — OXYCODONE-ACETAMINOPHEN 5-325 MG PO TABS: 2.0000 | ORAL_TABLET | ORAL | Status: DC | PRN

## 2021-07-27 MED ORDER — OXYTOCIN-SODIUM CHLORIDE 30-0.9 UT/500ML-% IV SOLN: 2.5000 [IU]/h | INTRAVENOUS | Status: DC

## 2021-07-27 MED ORDER — LACTATED RINGERS IV SOLN: INTRAVENOUS | Status: DC

## 2021-07-27 MED ORDER — LACTATED RINGERS IV SOLN
500.0000 mL | INTRAVENOUS | Status: DC | PRN
  Administered 2021-07-28: 500 mL via INTRAVENOUS

## 2021-07-27 MED ORDER — ONDANSETRON HCL 4 MG/2ML IJ SOLN: 4.0000 mg | Freq: Four times a day (QID) | INTRAMUSCULAR | Status: DC | PRN

## 2021-07-27 MED ORDER — SODIUM CHLORIDE 0.9 % IV SOLN
5.0000 10*6.[IU] | Freq: Once | INTRAVENOUS | Status: AC
  Administered 2021-07-27: 5 10*6.[IU] via INTRAVENOUS
  Filled 2021-07-27: qty 5

## 2021-07-27 MED ORDER — LIDOCAINE HCL (PF) 1 % IJ SOLN: 30.0000 mL | INTRAMUSCULAR | Status: DC | PRN

## 2021-07-27 MED ORDER — OXYTOCIN BOLUS FROM INFUSION: 333.0000 mL | Freq: Once | INTRAVENOUS | Status: DC

## 2021-07-27 MED ORDER — ACETAMINOPHEN 325 MG PO TABS: 650.0000 mg | ORAL_TABLET | ORAL | Status: DC | PRN

## 2021-07-27 MED ORDER — BETAMETHASONE SOD PHOS & ACET 6 (3-3) MG/ML IJ SUSP
12.0000 mg | INTRAMUSCULAR | Status: DC
  Administered 2021-07-27: 12 mg via INTRAMUSCULAR
  Filled 2021-07-27: qty 5
  Filled 2021-07-27: qty 2

## 2021-07-27 NOTE — H&P (Signed)
Veronica SilenceZynovia Jewell Mclean is a 26 y.o. female, G2P0100, IUP at 35.4 weeks, presenting for PPROM over last couple days, heavier today with cxt with preterm labor now. Saw MFM today BPP 8/8. GBS unknown and pending, BMZ 1/6 given. 12/29 Growth, AFI 20.2  vertex, 4.15lb 23%, Pt endorse + Fm. Denies vaginal bleeding.   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 infection, normal chromosomes.  Follow growth.  Per MFM, start antenatal testing at 30 weeks.) increased amniotic fluid production marginal insertion of umbilical cord (Monitor growth WNL) sickle cell trait (Patient--FOB not involved, no information on [place] status.) vitamin D deficiency (Vitamin D = 21, on supplement) Bilateral EIFs noted, low risk genetic testing at 30 weeks.  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    Past Medical History:  Diagnosis Date   Chlamydia 09/2010, 01/2011, 12/2014   Gonorrhea 09/2010, 12/2014   HSV infection    Infection    UTI     No current facility-administered medications on file prior to encounter.   Current Outpatient Medications on File Prior to Encounter  Medication Sig Dispense Refill   Prenatal Vit-Fe Fumarate-FA  (PRENATAL VITAMINS PO) Take by mouth.     terconazole (TERAZOL 7) 0.4 % vaginal cream Place 1 applicator vaginally at bedtime. (Patient not taking: Reported on 07/27/2021)       No Known Allergies  History of present pregnancy: Pt Info/Preference:  Screening/Consents:  Labs:   EDD: Estimated Date of Delivery: 08/27/21  Establised: Patient's last menstrual period was 11/12/2020 (approximate).  Anatomy Scan: Date: 10/28 Placenta Location: anterior  Genetic Screen: [place]:LR female AFP:  First Tri: Quad:  Office: [facility]            First PNV: 11.1 weeks Blood Type --/--/PENDING (01/06 2220) B+  Language: english Last PNV: 35.3 weeks Rhogam    Flu Vaccine:  Declined   Antibody PENDING (01/06 2220) Neg  TDaP vaccine Declined   GTT: Early: ???? Third Trimester: 2593  Feeding Plan: Breast/bottle BTL: no Rubella:  Immune  Contraception: ??? VBAC: no RPR:   NR  Circumcision: In pt deisred    HBsAg:  Neg/ HCV Neg  Pediatrician:  ???   HIV:   Neg  Prenatal Classes: no Additional US: Growth, AFI 20.2  vertex, 4.15lb 23% GBS:  Unknown pending (For PCN allergy, check sensitivities)       Chlamydia: neg    MFM Referral/Consult: Done see EPIC GC: neg  Support Person: mother   PAP: 2022 WNL  Pain Management: epidural Neonatologist Referral:  Hgb Electrophoresis:  AS  Birth Plan: None   Hgb NOB: 13    28W: 11.5  OB History     Gravida  2   Para  1   Term  0   Preterm  1   AB  0   Living  0      SAB  0   IAB  0   Ectopic  0   Multiple  0   Live Births  0          Past Medical History:  Diagnosis Date   Chlamydia 09/2010, 01/2011, 12/2014   Gonorrhea 09/2010, 12/2014   HSV infection    Infection    UTI   Past Surgical History:  Procedure Laterality Date   NO PAST SURGERIES     Family History: family history includes Hypertension in her mother; Kidney disease in her father. Social History:  reports that she quit smoking about 9 months ago. Her smoking use included cigars. She  started smoking about 2 years ago. She has never used smokeless tobacco. She reports that she does not currently use alcohol. She reports that she does not currently use drugs after having used the following drugs: Marijuana.   Prenatal Transfer Tool  Maternal Diabetes: No Genetic Screening: Normal Maternal Ultrasounds/Referrals: Isolated EIF (echogenic intracardiac focus) Fetal Ultrasounds or other Referrals:  Referred to Materal Fetal Medicine  Maternal Substance Abuse:  No Significant Maternal Medications:  None Significant Maternal Lab Results: Other: unknown and pending now.   ROS:  ROS   Physical Exam: BP 120/67    Pulse 95    Temp 98.9 F (37.2 C) (Oral)    Resp 18    Ht 5\' 7"  (1.702 m)    Wt 81.6 kg    LMP 11/12/2020 (Approximate)    SpO2 100%    BMI 28.19 kg/m   Physical Exam   NST: FHR baseline 140 bpm, Variability: moderate, Accelerations:present, Decelerations:  Absent= Cat 1/Reactive UC:   irregular, every 2-5 minutes, lasting 30-80 seconds SVE:   Dilation: 3 Effacement (%): 80 Station: -3 Exam by:: [facility], cnm, vertex verified by fetal sutures.  Leopold's: Position vertex, EFW 5lbs via leopold's.   Labs: Results for orders placed or performed during the hospital encounter of 07/27/21 (from the past 24 hour(s))  Amnisure rupture of membrane (rom)not at [facility]     Status: None   Collection Time: 07/27/21  8:47 PM  Result Value Ref Range   Amnisure ROM POSITIVE   Type and screen [place]     Status: None (Preliminary result)   Collection Time: 07/27/21 10:20 PM  Result Value Ref Range   ABO/RH(D) PENDING    Antibody Screen PENDING    Sample Expiration      07/30/2021,2359 Performed at [facility], [place] [address], [place], [place] [address]     Imaging:  [place] MFM FETAL BPP WO NON STRESS  Result Date: 07/27/2021 ----------------------------------------------------------------------  OBSTETRICS REPORT                       (Signed  Final 07/27/2021 01:56 pm) ---------------------------------------------------------------------- Patient Info  ID #:       [id number]                          D.O.B.:  1996/06/05 (26 yrs)  Name:       Veronica Mclean Mclean                   Visit Date: 07/27/2021 01:03 pm  [place] ---------------------------------------------------------------------- Performed By  Attending:        Tama High MD        Ref. Address:     [facility] &                                                             Gynecology                                                             [place]                                                             [place], [place]  Performed By:     Margaretann Loveless     Location:         [facility]                    RDMS                                     [facility] at  [facility]                                                             [facility]  Referred By:      Henreitta Leber                    [place] ---------------------------------------------------------------------- Orders  #  Description                           Code        Ordered By  1  [place] MFM FETAL BPP WO NON               76819.01    YU FANG     STRESS ----------------------------------------------------------------------  #  Order #                     Accession #                Episode #  1  [id number]                   [id number]                 [id number] ---------------------------------------------------------------------- Indications  Poor obstetric history: Previous IUFD (26      O09.299  weeks)  Marginal insertion of umbilical cord affecting O43.193  management of mother in third  trimester  Uterine abnormality during pregnancy           O34.599  (bicornuate)  [redacted] weeks gestation of pregnancy                Z3A.35 ---------------------------------------------------------------------- Fetal Evaluation  Num Of Fetuses:         1  Fetal Heart Rate(bpm):  138  Cardiac Activity:       Observed  Presentation:           Cephalic  Placenta:               Anterior  P. Cord Insertion:      Marginal insertion prev vis.  Amniotic Fluid  AFI FV:      Within normal limits  AFI Sum(cm)     %Tile       Largest Pocket(cm)  10.11           22          3.8  RUQ(cm)       RLQ(cm)       LUQ(cm)        LLQ(cm)  3.07          3.8           1.62           1.62 ---------------------------------------------------------------------- Biophysical Evaluation  Amniotic F.V:   Pocket => 2 cm             F. Tone:        Observed  F. Movement:    Observed                   Score:          8/8  F. Breathing:   Observed ---------------------------------------------------------------------- OB History  Gravidity:    2  Term:   0        Prem:   1        SAB:   0  TOP:          0       Ectopic:  0        Living: 0 ---------------------------------------------------------------------- Gestational Age  LMP:           36w 5d        Date:  11/12/20                 EDD:   08/19/21  Best:          Barbie Haggis 4d     Det. ByLoman Chroman         EDD:   08/27/21                                      (01/15/21) ---------------------------------------------------------------------- Anatomy  Cranium:               Appears normal         LVOT:                   Previously seen  Cavum:                 Previously seen        Aortic Arch:            Previously seen  Ventricles:            Previously seen        Ductal Arch:            Previously seen  Choroid Plexus:        Previously seen        Diaphragm:              Appears normal  Cerebellum:            Previously seen        Stomach:                Appears normal, left                                                                         sided  Posterior Fossa:       Previously seen        Abdomen:                Previously seen  Nuchal Fold:           Not applicable (Q000111Q    Abdominal Wall:         Previously seen                         wks GA)  Face:                  Orbits and profile     Cord Vessels:           Previously seen  previously seen  Lips:                  Previously seen        Kidneys:                Appear normal  Palate:                Previously seen        Bladder:                Appears normal  Thoracic:              Appears normal         Spine:                  Previously seen  Heart:                 Appears normal; EIF    Upper Extremities:      Previously seen  RVOT:                  Previously seen        Lower Extremities:      Previously seen  Other:  Heels/feet, open hands/5th digits, 3VV, lenses, and nasal bone          previously visualized. Female gender previously seen.adv Technically          difficult due to advanced GA and fetal position. ---------------------------------------------------------------------- Cervix Uterus Adnexa  Cervix  Not visualized (advanced GA >24wks)  Uterus  Normal shape and size.  Right Ovary  Within normal limits.  Left Ovary  Within normal limits.  Cul De Sac  No free fluid seen.  Adnexa  No adnexal mass visualized. ---------------------------------------------------------------------- Impression  History of stillbirth at [redacted] weeks gestation.  Amniotic fluid is normal and good fetal activity is seen  .Antenatal testing is reassuring. BPP 8/8. ---------------------------------------------------------------------- Recommendations  Continue weekly BPP till delivery ----------------------------------------------------------------------                  Tama High, MD Electronically Signed Final Report   07/27/2021 01:56 pm ----------------------------------------------------------------------  [place] MFM FETAL BPP WO  NON STRESS  Result Date: 07/19/2021 ----------------------------------------------------------------------  OBSTETRICS REPORT                       (Signed Final 07/19/2021 09:51 am) ---------------------------------------------------------------------- Patient Info  ID #:       [id number]                          D.O.B.:  Apr 01, 1996 (25 yrs)  Name:       Veronica Mclean Mclean                   Visit Date: 07/19/2021 08:44 am              [place] ---------------------------------------------------------------------- Performed By  Attending:        Tama High MD        Ref. Address:     [facility] &  Gynecology                                                             [place]                                                             [place], [place]  Performed By:     Jacob Moores BS,       Location:         [facility]                    RDMS, RVT                                Fetal Care at                                                             [facility]                                                             [facility]  Referred By:      Earnstine Regal                    [place] ---------------------------------------------------------------------- Orders  #  Description                           Code        Ordered By  1  [place] MFM FETAL BPP WO NON  [id number]    YU FANG     STRESS  2  [place] MFM OB FOLLOW UP                   [id number]    YU FANG ----------------------------------------------------------------------  #  Order #                     Accession #                Episode #  1  [id number]                   [id number]                 [id number]  2  [id number]                    [id number]                 [id number] ---------------------------------------------------------------------- Indications  Poor obstetric history: Previous IUFD (26      O09.299  weeks)  Marginal insertion of umbilical cord affecting O43.193  management of mother in third trimester  [redacted] weeks gestation of pregnancy                Z3A.34  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 Cord Insertion:      Previously Visualized  Amniotic Fluid  AFI FV:      Within normal limits  AFI Sum(cm)     %Tile       Largest Pocket(cm)  20.2            76          6.5  RUQ(cm)       RLQ(cm)       LUQ(cm)        LLQ(cm)  4.1           5.3           6.5            4.3 ---------------------------------------------------------------------- Biophysical Evaluation  Amniotic F.V:   Pocket => 2 cm             F. Tone:        Observed  F. Movement:    Observed                   Score:          8/8  F. Breathing:   Observed ---------------------------------------------------------------------- Biometry  BPD:      87.4  mm     G. Age:  35w 2d         74  %    CI:        84.82   %    70 - 86  FL/HC:      21.2   %    19.4 - 21.8  HC:       299   mm     G. Age:  33w 1d        2.7  %    HC/AC:      1.00        0.96 - 1.11  AC:      299.2  mm     G. Age:  33w 6d         39  %    FL/BPD:     72.7   %    71 - 87  FL:       63.5  mm     G. Age:  32w 6d          9  %    FL/AC:      21.2   %    20 - 24  LV:        6.8  mm  Est. FW:    2242  gm    4 lb 15 oz      23  % ---------------------------------------------------------------------- OB History   Gravidity:    2         Term:   0        Prem:   1        SAB:   0  TOP:          0       Ectopic:  0        Living: 0 ---------------------------------------------------------------------- Gestational Age  LMP:           35w 4d        Date:  11/12/20                 EDD:   08/19/21  U/S Today:     33w 6d                                        EDD:   08/31/21  Best:          34w 3d     Det. By:  Loman Chroman         EDD:   08/27/21                                      (01/15/21) ---------------------------------------------------------------------- Anatomy  Cranium:               Appears normal         LVOT:                   Previously seen  Cavum:                 Previously seen        Aortic Arch:            Previously seen  Ventricles:            Appears normal         Ductal Arch:            Previously seen  Choroid Plexus:        Previously seen        Diaphragm:  Appears normal  Cerebellum:            Previously seen        Stomach:                Appears normal, left                                                                        sided  Posterior Fossa:       Previously seen        Abdomen:                Previously seen  Nuchal Fold:           Not applicable (Q000111Q    Abdominal Wall:         Previously seen                         wks GA)  Face:                  Orbits and profile     Cord Vessels:           Previously seen                         previously seen  Lips:                  Previously seen        Kidneys:                Appear normal  Palate:                Previously seen        Bladder:                Appears normal  Thoracic:              Appears normal         Spine:                  Previously seen  Heart:                 Appears normal         Upper Extremities:      Previously seen                         (4CH, axis, and                         situs)  RVOT:                  Previously seen        Lower Extremities:      Previously seen  Other:  Heels/feet, open  hands/5th digits, 3VV, lenses, and nasal bone          previously visualized. Female gender previously seen.adv Technically          difficult due to advanced GA and fetal position. ---------------------------------------------------------------------- Cervix Uterus Adnexa  Cervix  Not visualized (advanced GA >24wks)  Uterus  Bicornuate.  Right Ovary  Within normal limits.  Left Ovary  Within normal limits.  Cul De Sac  No free fluid seen.  Adnexa  No adnexal mass visualized. ---------------------------------------------------------------------- Impression  History of stillbirth at [redacted] weeks gestation.  Patient is here for  fetal growth assessment and antenatal testing.  She does not  have gestational diabetes.  Blood pressure today at her office  is 117/64 mmHg.  Marginal cord insertion was seen on previous ultrasound.  Amniotic fluid is normal and good fetal activity is seen  .Antenatal testing is reassuring. BPP 8/8. Fetal growth is  appropriate for gestational age .  We reassured the patient of the findings ---------------------------------------------------------------------- Recommendations  -Continue weekly BPP till delivery.  -Delivery at [redacted] weeks gestation. If the patient is extremely  anxious because of history of stillbirth, delivery at 60 weeks'  gestation may be considered. ----------------------------------------------------------------------                  Tama High, MD Electronically Signed Final Report   07/19/2021 09:51 am ----------------------------------------------------------------------  [place] MFM FETAL BPP WO NON STRESS  Result Date: 07/05/2021 ----------------------------------------------------------------------  OBSTETRICS REPORT                       (Signed Final 07/05/2021 12:19 pm) ---------------------------------------------------------------------- Patient Info  ID #:       [id number]                          D.O.B.:  09-Apr-1996 (25 yrs)  Name:       Veronica Mclean Mclean                    Visit Date: 07/05/2021 08:20 am              [place] ---------------------------------------------------------------------- Performed By  Attending:        Johnell Comings MD         Ref. Address:     [facility] &                                                             Gynecology                                                             [address]  [place], [place]  Performed By:     Marye Round Pharisien     Location:         [facility]                    RDMS                                     Fetal Care at                                                             [place] [facility]                                                             [facility]  Referred By:      Earnstine Regal                    [place] ---------------------------------------------------------------------- Orders  #  Description                           Code        Ordered By  1  [place] MFM FETAL BPP WO NON               [id number]    YU FANG     STRESS ----------------------------------------------------------------------  #  Order #                     Accession #                Episode #  1  [id number]                   [id number]                 [id number] ---------------------------------------------------------------------- Indications  Poor obstetric history: Previous IUFD (26      O09.299  weeks)  Uterine abnormality during pregnancy           O34.599  (bicornuate)  [redacted] weeks gestation of pregnancy                Z3A.32 ---------------------------------------------------------------------- Fetal Evaluation  Num Of Fetuses:         1  Fetal Heart Rate(bpm):  144  Cardiac Activity:       Observed  Presentation:            Cephalic  Placenta:               Anterior  P. Cord Insertion:      Previously Visualized  Amniotic Fluid  AFI FV:      Within normal limits  AFI Sum(cm)     %Tile       Largest Pocket(cm)  19.12           71          5.45  RUQ(cm)       RLQ(cm)       LUQ(cm)        LLQ(cm)  5.15          3.79          5.45           4.73 ---------------------------------------------------------------------- Biophysical Evaluation  Amniotic F.V:   Pocket => 2 cm             F. Tone:        Observed  F. Movement:    Observed                   Score:          8/8  F. Breathing:   Observed ---------------------------------------------------------------------- Biometry  LV:          3  mm ---------------------------------------------------------------------- OB History  Gravidity:    2         Term:   0        Prem:   1        SAB:   0  TOP:          0       Ectopic:  0        Living: 0 ---------------------------------------------------------------------- Gestational Age  LMP:           33w 4d        Date:  11/12/20                 EDD:   08/19/21  Best:          Milderd Meager 3d     Det. By:  Loman Chroman         EDD:   08/27/21                                      (01/15/21) ---------------------------------------------------------------------- Anatomy  Cranium:               Appears normal         Stomach:                Appears normal, left                                                                        sided  Ventricles:            Appears normal         Kidneys:  Appear normal  Thoracic:              Appears normal         Bladder:                Appears normal  Diaphragm:             Appears normal ---------------------------------------------------------------------- Cervix Uterus Adnexa  Cervix  Not visualized (advanced GA >24wks)  Uterus  Bicornuate.  Right Ovary  Within normal limits.  Left Ovary  Within normal limits.  Cul De Sac  No free fluid seen.  Adnexa  No adnexal mass visualized.  ---------------------------------------------------------------------- Comments  This patient was seen for a BPP due to a prior IUFD.  She  denies any problems since her last exam.  A biophysical profile performed today was 8 out of 8.  There was normal amniotic fluid noted on today's ultrasound  exam.  She will return in 1 week for another BPP. ----------------------------------------------------------------------                   Johnell Comings, MD Electronically Signed Final Report   07/05/2021 12:19 pm ----------------------------------------------------------------------  [place] MFM OB FOLLOW UP  Result Date: 07/19/2021 ----------------------------------------------------------------------  OBSTETRICS REPORT                       (Signed Final 07/19/2021 09:51 am) ---------------------------------------------------------------------- Patient Info  ID #:       [id number]                          D.O.B.:  [birth year] (25 yrs)  Name:       Veronica Mclean Mclean                   Visit Date: 07/19/2021 08:44 am              [place] ---------------------------------------------------------------------- Performed By  Attending:        Tama High MD        Ref. Address:     [facility] &                                                             Gynecology                                                             [address]  [place], [place]  Performed By:     Jacob Moores BS,       Location:         [facility]                    RDMS, RVT                                Fetal Care at                                                             [facility]                                                              Women  Referred By:      Earnstine Regal                    [place] ---------------------------------------------------------------------- Orders  #  Description                           Code        Ordered By  1  [place] MFM FETAL BPP WO NON               76819.01    [place]  2  [place] MFM OB FOLLOW UP                   [id number]    YU FANG ----------------------------------------------------------------------  #  Order #                     Accession #                Episode #  1  [id number]                   [id number]  [id number]  2  [id number]                   [id number]                 [id number] ---------------------------------------------------------------------- Indications  Poor obstetric history: Previous IUFD (26      O09.299  weeks)  Marginal insertion of umbilical cord affecting O43.193  management of mother in third trimester  [redacted] weeks gestation of pregnancy                Z3A.34  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 Cord Insertion:      Previously Visualized  Amniotic Fluid  AFI FV:      Within normal limits  AFI Sum(cm)     %Tile       Largest Pocket(cm)  20.2            76          6.5  RUQ(cm)       RLQ(cm)       LUQ(cm)        LLQ(cm)  4.1           5.3           6.5            4.3 ---------------------------------------------------------------------- Biophysical Evaluation  Amniotic F.V:   Pocket => 2 cm             F. Tone:        Observed  F. Movement:    Observed                   Score:          8/8  F. Breathing:   Observed ---------------------------------------------------------------------- Biometry  BPD:       87.4  mm     G. Age:  35w 2d         74  %    CI:        84.82   %    70 - 86                                                          FL/HC:      21.2   %    19.4 - 21.8  HC:       299   mm     G. Age:  33w 1d        2.7  %    HC/AC:      1.00        0.96 - 1.11  AC:      299.2  mm     G. Age:  33w 6d         39  %  FL/BPD:     72.7   %    71 - 87  FL:       63.5  mm     G. Age:  32w 6d          9  %    FL/AC:      21.2   %    20 - 24  LV:        6.8  mm  Est. FW:    2242  gm    4 lb 15 oz      23  % ---------------------------------------------------------------------- OB History  Gravidity:    2         Term:   0        Prem:   1        SAB:   0  TOP:          0       Ectopic:  0        Living: 0 ---------------------------------------------------------------------- Gestational Age  LMP:           35w 4d        Date:  11/12/20                 EDD:   08/19/21  U/S Today:     33w 6d                                        EDD:   08/31/21  Best:          34w 3d     Det. ByLoman Chroman         EDD:   08/27/21                                      (01/15/21) ---------------------------------------------------------------------- Anatomy  Cranium:               Appears normal         LVOT:                   Previously seen  Cavum:                 Previously seen        Aortic Arch:            Previously seen  Ventricles:            Appears normal         Ductal Arch:            Previously seen  Choroid Plexus:        Previously seen        Diaphragm:              Appears normal  Cerebellum:            Previously seen        Stomach:                Appears normal, left  sided  Posterior Fossa:       Previously seen        Abdomen:                Previously seen  Nuchal Fold:           Not applicable ([id number]    Abdominal Wall:         Previously seen                         wks GA)  Face:                  Orbits and profile     Cord Vessels:            Previously seen                         previously seen  Lips:                  Previously seen        Kidneys:                Appear normal  Palate:                Previously seen        Bladder:                Appears normal  Thoracic:              Appears normal         Spine:                  Previously seen  Heart:                 Appears normal         Upper Extremities:      Previously seen                         (4CH, axis, and                         situs)  RVOT:                  Previously seen        Lower Extremities:      Previously seen  Other:  Heels/feet, open hands/5th digits, 3VV, lenses, and nasal bone          previously visualized. Female gender previously seen.adv Technically          difficult due to advanced GA and fetal position. ---------------------------------------------------------------------- Cervix Uterus Adnexa  Cervix  Not visualized (advanced GA >24wks)  Uterus  Bicornuate.  Right Ovary  Within normal limits.  Left Ovary  Within normal limits.  Cul De Sac  No free fluid seen.  Adnexa  No adnexal mass visualized. ---------------------------------------------------------------------- Impression  History of stillbirth at [redacted] weeks gestation.  Patient is here for  fetal growth assessment and antenatal testing.  She does not  have gestational diabetes.  Blood pressure today at her office  is 117/64 mmHg.  Marginal cord insertion was seen on previous ultrasound.  Amniotic fluid is normal and good fetal activity is seen  .Antenatal testing is reassuring. BPP 8/8. Fetal growth is  appropriate for gestational age .  We reassured the patient of the findings ---------------------------------------------------------------------- Recommendations  -  Continue weekly BPP till delivery.  -Delivery at [redacted] weeks gestation. If the patient is extremely  anxious because of history of stillbirth, delivery at 68 weeks'  gestation may be considered.  ----------------------------------------------------------------------                  Tama High, MD Electronically Signed Final Report   07/19/2021 09:51 am ----------------------------------------------------------------------   MAU Course: Orders Placed This Encounter  Procedures   Resp Panel by RT-PCR (Flu A&B, Covid) Nasopharyngeal Swab   Group B strep by PCR   Amnisure rupture of membrane (rom)not at [facility]   CBC with Differential/Platelet   Cervical Exam   Insert urethral catheter X 1 PRN If Coude Catheter is chosen, qualified resources by campus can be found in the clinical skills nursing procedure for Coude Catheter 1. If straight catheterized > 2 times or patient unable to void post epidural plac...   Type and screen [place]   Meds ordered this encounter  Medications   betamethasone acetate-betamethasone sodium phosphate (CELESTONE) injection 12 mg    Assessment/Plan: Denene Reisinger is a 26 y.o. female, G2P0100, IUP at 35.4 weeks, presenting for PPROM over last couple days, heavier today with cxt and preterm labor now. Saw MFM today BPP 8/8. GBS unknown and pending, BMZ 1/6 given. 12/29 Growth, AFI 20.2  vertex, 4.15lb 23%, Pt endorse + Fm. Denies vaginal bleeding.   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 infection, normal chromosomes.  Follow growth.  Per MFM, start antenatal testing at 30 weeks.) increased amniotic fluid production marginal insertion of umbilical cord (Monitor growth WNL) sickle cell trait (Patient--FOB not involved, no information on [place] status.) vitamin D deficiency (Vitamin D = 21, on supplement) Bilateral EIFs noted, low risk genetic testing at 30 weeks.  FWB: Cat 1 Fetal Tracing.   Plan: Admit to [place] per consult with Dr Charlesetta Garibaldi Routine [facility] orders Expectant management BMZ Q24H x2 Pain med/epidural  prn PCN G for GBS prophylaxis for pending GBS, will d/c if negative.  Anticipate labor progression   Noralyn Pick NP-C, CNM, MSN 07/27/2021, 10:43 PM

## 2021-07-27 NOTE — Progress Notes (Signed)
Mistakenly DC pt from epic

## 2021-07-27 NOTE — MAU Provider Note (Addendum)
History     CSN: [id number]  Arrival date and time: 07/27/21 2017   Event Date/Time   First Provider Initiated Contact with Patient 07/27/21 2051      Chief Complaint  Patient presents with   Rupture of Membranes   HPI Shealynn Saulnier 26 y.o. [redacted]w[redacted]d Reports having leaking of fluid since 0500.  Reports it keeps running down her leg although none viewed by RN in MAU.  Is having some lower abdominal cramping that is starting tonight.  On fetal monitor.  Will order amnisure.  OB History     Gravida  2   Para  1   Term  0   Preterm  1   AB  0   Living  0      SAB  0   IAB  0   Ectopic  0   Multiple  0   Live Births  0           Past Medical History:  Diagnosis Date   Chlamydia 09/2010, 01/2011, 12/2014   Gonorrhea 09/2010, 12/2014   HSV infection    Infection    UTI    Past Surgical History:  Procedure Laterality Date   NO PAST SURGERIES      Family History  Problem Relation Age of Onset   Hypertension Mother    Kidney disease Father        kidney stones    Social History   Tobacco Use   Smoking status: Former    Types: Cigars    Start date: 01/27/2019    Quit date: 10/21/2020    Years since quitting: 0.7   Smokeless tobacco: Never   Tobacco comments:    1-2 black and milds per day- stopped with  +preg  Vaping Use   Vaping Use: Former  Substance Use Topics   Alcohol use: Not Currently    Comment: Social   Drug use: Not Currently    Types: Marijuana    Comment: LAST USE A COUPLE MONTHS    Allergies: No Known Allergies  Medications Prior to Admission  Medication Sig Dispense Refill Last Dose   Prenatal Vit-Fe Fumarate-FA (PRENATAL VITAMINS PO) Take by mouth.      terconazole (TERAZOL 7) 0.4 % vaginal cream Place 1 applicator vaginally at bedtime. (Patient not taking: Reported on 07/27/2021)       Review of Systems  Constitutional:  Negative for fever.  Respiratory:  Negative for cough and shortness of breath.    Gastrointestinal:  Positive for abdominal pain. Negative for nausea and vomiting.  Genitourinary:  Negative for dysuria, vaginal bleeding and vaginal discharge.   Physical Exam   Blood pressure 120/67, pulse 95, temperature 98.9 F (37.2 C), temperature source Oral, resp. rate 18, height 5\' 7"  (1.702 m), weight 81.6 kg, last menstrual period 11/12/2020, SpO2 100 %.  Physical Exam Vitals and nursing note reviewed.  Constitutional:      Appearance: She is well-developed.  HENT:     Head: Normocephalic.  Cardiovascular:     Rate and Rhythm: Regular rhythm.  Pulmonary:     Effort: Pulmonary effort is normal.  Abdominal:     Palpations: Abdomen is soft.     Tenderness: There is no abdominal tenderness. There is no guarding or rebound.  Musculoskeletal:        General: Normal range of motion.     Cervical back: Neck supple.  Skin:    General: Skin is warm and dry.  Neurological:  Mental Status: She is alert and oriented to person, place, and time.  Psychiatric:        Mood and Affect: Mood normal.        Behavior: Behavior normal.        Thought Content: Thought content normal.    MAU Course  Procedures  MDM Seen in the office today and was told by the nurse that increased discharge is normal.  Patient says fluid keeps coming and thinks she is leaking amniotic fluid.  Will do Amnisure to confirm.  Contractions 5-7 minutes  Care assumed by Sabas Sous, CNM at 2110  Assessment and Plan    Terri L Burleson 07/27/2021, 9:01 PM   Reassessment (9:48 PM) -Amnisure returns positive. -Provider to bedside to inform patient of results.  -Informed that primary ob will contacted regarding findings.  -Patient appropriately tearful and reassurances given. -Questions addressed. Philemon Kingdom, CNM contacted and informed of findings. -States she will be at bedside to assess and implement plan accordingly.  Cherre Robins MSN, CNM Advanced Practice Provider, [facility] [employer]

## 2021-07-27 NOTE — MAU Note (Signed)
Pt stated she has being leaking a lot of clear fluid since 5am, has an appt today and "told the nurse, but she said it was normal" Uncomplicated pregnancy, has hx of IUFD

## 2021-07-28 ENCOUNTER — Encounter (HOSPITAL_COMMUNITY): Payer: Self-pay | Admitting: Obstetrics and Gynecology

## 2021-07-28 ENCOUNTER — Inpatient Hospital Stay (HOSPITAL_COMMUNITY): Payer: BC Managed Care – PPO | Admitting: Anesthesiology

## 2021-07-28 LAB — GROUP B STREP BY PCR: Group B strep by PCR: NEGATIVE

## 2021-07-28 MED ORDER — COCONUT OIL OIL
1.0000 "application " | TOPICAL_OIL | Status: DC | PRN
  Administered 2021-07-28: 1 via TOPICAL

## 2021-07-28 MED ORDER — TERBUTALINE SULFATE 1 MG/ML IJ SOLN: 0.2500 mg | Freq: Once | INTRAMUSCULAR | Status: DC | PRN

## 2021-07-28 MED ORDER — DIPHENHYDRAMINE HCL 50 MG/ML IJ SOLN: 12.5000 mg | INTRAMUSCULAR | Status: DC | PRN

## 2021-07-28 MED ORDER — OXYTOCIN-SODIUM CHLORIDE 30-0.9 UT/500ML-% IV SOLN
1.0000 m[IU]/min | INTRAVENOUS | Status: DC
  Administered 2021-07-28: 1 m[IU]/min via INTRAVENOUS
  Filled 2021-07-28: qty 500

## 2021-07-28 MED ORDER — FENTANYL-BUPIVACAINE-NACL 0.5-0.125-0.9 MG/250ML-% EP SOLN
EPIDURAL | Status: DC | PRN
  Administered 2021-07-28: 12 mL/h via EPIDURAL

## 2021-07-28 MED ORDER — LACTATED RINGERS AMNIOINFUSION: INTRAVENOUS | Status: DC

## 2021-07-28 MED ORDER — SENNOSIDES-DOCUSATE SODIUM 8.6-50 MG PO TABS
2.0000 | ORAL_TABLET | Freq: Every day | ORAL | Status: DC
  Administered 2021-07-29: 2 via ORAL
  Filled 2021-07-28: qty 2

## 2021-07-28 MED ORDER — TETANUS-DIPHTH-ACELL PERTUSSIS 5-2.5-18.5 LF-MCG/0.5 IM SUSY: 0.5000 mL | PREFILLED_SYRINGE | Freq: Once | INTRAMUSCULAR | Status: DC

## 2021-07-28 MED ORDER — EPHEDRINE 5 MG/ML INJ: 10.0000 mg | INTRAVENOUS | Status: DC | PRN

## 2021-07-28 MED ORDER — WITCH HAZEL-GLYCERIN EX PADS: 1.0000 "application " | MEDICATED_PAD | CUTANEOUS | Status: DC | PRN

## 2021-07-28 MED ORDER — PHENYLEPHRINE 40 MCG/ML (10ML) SYRINGE FOR IV PUSH (FOR BLOOD PRESSURE SUPPORT)
80.0000 ug | PREFILLED_SYRINGE | INTRAVENOUS | Status: DC | PRN
  Administered 2021-07-28: 80 ug via INTRAVENOUS

## 2021-07-28 MED ORDER — DIPHENHYDRAMINE HCL 25 MG PO CAPS: 25.0000 mg | ORAL_CAPSULE | Freq: Four times a day (QID) | ORAL | Status: DC | PRN

## 2021-07-28 MED ORDER — LIDOCAINE HCL (PF) 1 % IJ SOLN
INTRAMUSCULAR | Status: DC | PRN
  Administered 2021-07-28: 5 mL via EPIDURAL

## 2021-07-28 MED ORDER — LACTATED RINGERS IV SOLN: 500.0000 mL | Freq: Once | INTRAVENOUS | Status: DC

## 2021-07-28 MED ORDER — BENZOCAINE-MENTHOL 20-0.5 % EX AERO
1.0000 "application " | INHALATION_SPRAY | CUTANEOUS | Status: DC | PRN
  Administered 2021-07-28: 1 via TOPICAL
  Filled 2021-07-28: qty 56

## 2021-07-28 MED ORDER — PHENYLEPHRINE 40 MCG/ML (10ML) SYRINGE FOR IV PUSH (FOR BLOOD PRESSURE SUPPORT): 80.0000 ug | PREFILLED_SYRINGE | INTRAVENOUS | Status: DC | PRN

## 2021-07-28 MED ORDER — ACETAMINOPHEN 325 MG PO TABS
650.0000 mg | ORAL_TABLET | ORAL | Status: DC | PRN
  Administered 2021-07-29: 650 mg via ORAL
  Filled 2021-07-28: qty 2

## 2021-07-28 MED ORDER — IBUPROFEN 600 MG PO TABS
600.0000 mg | ORAL_TABLET | Freq: Four times a day (QID) | ORAL | Status: DC
  Administered 2021-07-28 – 2021-07-30 (×8): 600 mg via ORAL
  Filled 2021-07-28 (×8): qty 1

## 2021-07-28 MED ORDER — SIMETHICONE 80 MG PO CHEW: 80.0000 mg | CHEWABLE_TABLET | ORAL | Status: DC | PRN

## 2021-07-28 MED ORDER — ZOLPIDEM TARTRATE 5 MG PO TABS: 5.0000 mg | ORAL_TABLET | Freq: Every evening | ORAL | Status: DC | PRN

## 2021-07-28 MED ORDER — FENTANYL-BUPIVACAINE-NACL 0.5-0.125-0.9 MG/250ML-% EP SOLN
12.0000 mL/h | EPIDURAL | Status: DC | PRN
  Filled 2021-07-28: qty 250

## 2021-07-28 MED ORDER — DIBUCAINE (PERIANAL) 1 % EX OINT: 1.0000 "application " | TOPICAL_OINTMENT | CUTANEOUS | Status: DC | PRN

## 2021-07-28 MED ORDER — PRENATAL MULTIVITAMIN CH
1.0000 | ORAL_TABLET | Freq: Every day | ORAL | Status: DC
  Filled 2021-07-28: qty 1

## 2021-07-28 NOTE — Lactation Note (Signed)
This note was copied from a baby's chart. Lactation Consultation Note  Patient Name: Veronica Mclean [id number] Date: 07/28/2021 Reason for consult: L&D Initial assessment;Mother's request;Difficult latch;Primapara;1st time breastfeeding;Late-preterm 34-36.6wks;Breastfeeding assistance Age:27 hours  LC attempted latch but breast compressible and inverts with touch, some areolar edema.  Mom need hand pump or breast shells to help with breast tissue.   RN, Sherol Dade aware offer formula for now. Mom to get further [facility] assistance with breastfeeding on the floor.   Maternal Data Has patient been taught Hand Expression?: Yes Does the patient have breastfeeding experience prior to this delivery?: No  Feeding Mother's Current Feeding Choice: Breast Milk and Formula  LATCH Score                    Lactation Tools Discussed/Used    Interventions Interventions: Breast feeding basics reviewed;Assisted with latch;Skin to skin;Breast massage;Hand express;Breast compression;Position options;Education  Discharge    Consult Status Consult Status: Follow-up from L&D Date: 07/29/21 Follow-up type: In-patient    Bertel Venard  Nicholson-Springer 07/28/2021, 6:46 PM

## 2021-07-28 NOTE — Anesthesia Procedure Notes (Signed)
Epidural Patient location during procedure: OB Start time: 07/28/2021 11:02 AM End time: 07/28/2021 11:18 AM  Staffing Anesthesiologist: Barnet Glasgow, MD Performed: anesthesiologist   Preanesthetic Checklist Completed: patient identified, IV checked, site marked, risks and benefits discussed, surgical consent, monitors and equipment checked, pre-op evaluation and timeout performed  Epidural Patient position: sitting Prep: DuraPrep and site prepped and draped Patient monitoring: continuous pulse ox and blood pressure Approach: midline Location: L3-L4 Injection technique: LOR air  Needle:  Needle type: Tuohy  Needle gauge: 17 G Needle length: 9 cm and 9 Needle insertion depth: 6 cm Catheter type: closed end flexible Catheter size: 19 Gauge Catheter at skin depth: 12 cm Test dose: negative  Assessment Events: blood not aspirated, injection not painful, no injection resistance, no paresthesia and negative IV test  Additional Notes Patient identified. Risks/Benefits/Options discussed with patient including but not limited to bleeding, infection, nerve damage, paralysis, failed block, incomplete pain control, headache, blood pressure changes, nausea, vomiting, reactions to medication both or allergic, itching and postpartum back pain. Confirmed with bedside nurse the patient's most recent platelet count. Confirmed with patient that they are not currently taking any anticoagulation, have any bleeding history or any family history of bleeding disorders. Patient expressed understanding and wished to proceed. All questions were answered. Sterile technique was used throughout the entire procedure. Please see nursing notes for vital signs. Test dose was given through epidural needle and negative prior to continuing to dose epidural or start infusion. Warning signs of high block given to the patient including shortness of breath, tingling/numbness in hands, complete motor block, or any  concerning symptoms with instructions to call for help. Patient was given instructions on fall risk and not to get out of bed. All questions and concerns addressed with instructions to call with any issues. 1 Attempt (S) . Patient tolerated procedure well.

## 2021-07-28 NOTE — Progress Notes (Signed)
Labor Progress Note  Sallee Kaidance Pantoja is a 26 y.o. female, G2P0100, IUP admitted at 35.4 weeks, currently 35.5 weeks presenting for PPROM over last couple days, heavier 1/6 @ 0500  with cxt with preterm labor now. Saw MFM today BPP 8/8. GBS unknown and pending, BMZ 1/6 given. 12/29 Growth, AFI 20.2  vertex, 4.15lb 23%, Pt endorse + Fm. Denies vaginal bleeding.   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 infection, normal chromosomes.  Follow growth.  Per MFM, start antenatal testing at 30 weeks.) increased amniotic fluid production marginal insertion of umbilical cord (Monitor growth WNL) sickle cell trait (Patient--FOB not involved, no information on [place] status.) vitamin D deficiency (Vitamin D = 21, on supplement) Bilateral EIFs noted, low risk genetic testing at 30 weeks. HSV 1+ (no lesion on spec, no prodromal s/sx.)   Subjective: Pt comfortable with epidural placement. Pt verbalized consent to continue iwht pitocin, R/B/A reviewed again with pt.  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 No intake/output data recorded. NST: FHR baseline 135 bpm, Variability: moderate, Accelerations:present, Decelerations:  Present  occ varible = Cat 1/Reactive CTX:  irregular, every 3-10 minutes Uterus gravid, soft non tender, moderate to palpate with contractions.  SVE:   Dilation: 5 Effacement (%): 80 Station: -1 Exam by:: [email or web address], CNM Pitocin at (5) mUn/min IUPC placed with ease: MVU 65 Amnio in place with fluid return.   Assessment:  Rufus Cypert is a 26 y.o. female, G2P0100, IUP admitted at 35.4 weeks, currently 35.5 weeks presenting for PPROM over last couple days, heavier 1/6 @ 0500  with cxt with preterm labor now. Saw MFM today BPP 8/8. GBS unknown and pending, BMZ 1/6 given. 12/29 Growth, AFI 20.2  vertex, 4.15lb 23%, Pt endorse + Fm. Denies vaginal bleeding. Pt in prodromal latent labor at 5cm now, IUPC for amnio continued.   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 infection, normal chromosomes.  Follow growth.  Per MFM, start antenatal testing at 30 weeks.) increased amniotic fluid production marginal insertion of umbilical cord (Monitor growth WNL) sickle cell trait (Patient--FOB not involved, no information on [place] status.) vitamin D deficiency (Vitamin D = 21, on supplement) Bilateral EIFs noted, low risk genetic testing at 30 weeks. 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   Membranes:  PPROM, clear 1/6 @ 0500 x 21hrs, no s/s of infection  IUPC: MVU 65s, amnio has fluid return noted.   Induction:    Cytotec xN/A pt presented with 3cm, ripe  Foley Bulb: N/A  Pitocin - 5  Pain management:    IV pain management: x Fentanyl given for vaginal check  and IUPC placement 50 mcg @ 0705  Nitrous:  PRN             Epidural placement: Placed @ 11 on 1/7  GBS Negative  Abx: PCN x1 dose given at that time GBS was unknown @ 2346 1/6  Preterm labor with PPROM: BMZ given 1/6 @ 2349   Plan: Continue labor plan Continuous monitoring Rest Frequent position changes to facilitate fetal rotation and descent. Will reassess with cervical exam at 4 hours or earlier if necessary Amnioinfusion continue 150 cc/hr. Continue pitocin 1x1 Next dose BMZ  1/7 @ 2300 Anticipate labor progression and vaginal delivery.   Dale [place], NP-C, CNM, MSN 07/28/2021. 12:47 PM

## 2021-07-28 NOTE — Anesthesia Preprocedure Evaluation (Signed)
Anesthesia Evaluation  Patient identified by MRN, date of birth, ID band Patient awake    Reviewed: Allergy & Precautions, NPO status , Patient's Chart, lab work & pertinent test results  Airway Mallampati: II  TM Distance: >3 FB Neck ROM: Full    Dental no notable dental hx. (+) Teeth Intact, Dental Advisory Given   Pulmonary former smoker,    Pulmonary exam normal breath sounds clear to auscultation       Cardiovascular Exercise Tolerance: Good negative cardio ROS Normal cardiovascular exam Rhythm:Regular Rate:Normal     Neuro/Psych negative neurological ROS  negative psych ROS   GI/Hepatic negative GI ROS, Neg liver ROS,   Endo/Other  negative endocrine ROS  Renal/GU negative Renal ROS     Musculoskeletal   Abdominal   Peds  Hematology Lab Results      Component                Value               Date                      WBC                      10.1                07/27/2021                HGB                      10.9 (L)            07/27/2021                HCT                      32.5 (L)            07/27/2021                MCV                      85.5                07/27/2021                PLT                      189                 07/27/2021              Anesthesia Other Findings   Reproductive/Obstetrics (+) Pregnancy                             Anesthesia Physical Anesthesia Plan  ASA: 2  Anesthesia Plan: Epidural   Post-op Pain Management:    Induction:   PONV Risk Score and Plan:   Airway Management Planned:   Additional Equipment:   Intra-op Plan:   Post-operative Plan:   Informed Consent: I have reviewed the patients History and Physical, chart, labs and discussed the procedure including the risks, benefits and alternatives for the proposed anesthesia with the patient or authorized representative who has indicated his/her understanding and  acceptance.       Plan Discussed with:   Anesthesia Plan Comments: (35.4 Wk G2P0  for LEA)        Anesthesia Quick Evaluation

## 2021-07-28 NOTE — Progress Notes (Signed)
Labor Progress Note  Veronica Mclean is a 26 y.o. female, G2P0100, IUP admitted at 35.4 weeks, currently 35.5 weeks presenting for PPROM over last couple days, heavier 1/6 @ 0500  with cxt with preterm labor now. Saw MFM today BPP 8/8. GBS unknown and pending, BMZ 1/6 given. 12/29 Growth, AFI 20.2  vertex, 4.15lb 23%, Pt endorse + Fm. Denies vaginal bleeding.   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 infection, normal chromosomes.  Follow growth.  Per MFM, start antenatal testing at 30 weeks.) increased amniotic fluid production marginal insertion of umbilical cord (Monitor growth WNL) sickle cell trait (Patient--FOB not involved, no information on [place] status.) vitamin D deficiency (Vitamin D = 21, on supplement) Bilateral EIFs noted, low risk genetic testing at 30 weeks.  Subjective: Pt stable and resting well in bed with GF and mother support at bedside.  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 No intake/output data recorded. NST: FHR baseline 130 bpm, Variability: moderate, Accelerations:present, Decelerations:  Present  occ varible = Cat 1/Reactive CTX:  irregular, every 3-10 minutes Uterus gravid, soft non tender, moderate to palpate with contractions.  SVE:  Dilation: 3 Effacement (%): 80 Station: -3 Exam by:: [email or web address], CNM Pitocin at (N/A) mUn/min  Assessment:  Veronica Mclean is a 26 y.o. female, G2P0100, IUP admitted at 35.4 weeks, currently 35.5 weeks presenting for PPROM over last couple days, heavier 1/6 @ 0500  with cxt with preterm labor now. Saw MFM today BPP 8/8. GBS unknown and pending, BMZ 1/6 given. 12/29 Growth, AFI 20.2  vertex, 4.15lb 23%, Pt endorse + Fm. Denies vaginal bleeding. Pt not progressing in early labor, cxt spacing out. Occ variables noted and spontaneously resolves.   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 infection, normal chromosomes.  Follow growth.  Per MFM, start antenatal testing at 30 weeks.) increased amniotic fluid production marginal insertion of umbilical cord (Monitor growth WNL) sickle cell trait (Patient--FOB not involved, no information on [place] status.) vitamin D deficiency (Vitamin D = 21, on supplement) Bilateral EIFs noted, low risk genetic testing at 30 weeks. 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   Membranes:  PPROM, clear 1/6 @ 0500 x 21hrs, no s/s of infection  Induction:    Cytotec xN/A pt 3cm, ripe  Foley Bulb: N/A  Pitocin - Expectant management.   Pain management:  IV pain management: x PRN  Nitrous:  PRN             Epidural placement: Plan to get later in pain   GBS Negative  Abx: PCN x1 dose given at that time GBS was unknown.    Plan: Continue labor plan Continuous monitoring Rest Ambulate Frequent position changes to facilitate fetal rotation and descent. Will  reassess with cervical exam at 4 hours or earlier if necessary Expectant management.  Anticipate labor progression and vaginal delivery.   [place], NP-C, CNM, MSN 07/28/2021. 2:50 AM

## 2021-07-28 NOTE — Progress Notes (Signed)
Labor Progress Note  Veronica Mclean is a 26 y.o. female, G2P0100, IUP admitted at 35.4 weeks, currently 35.5 weeks presenting for PPROM over last couple days, heavier 1/6 @ 0500  with cxt with preterm labor now. Saw MFM today BPP 8/8. GBS unknown and pending, BMZ 1/6 given. 12/29 Growth, AFI 20.2  vertex, 4.15lb 23%, Pt endorse + Fm. Denies vaginal bleeding.   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 infection, normal chromosomes.  Follow growth.  Per MFM, start antenatal testing at 30 weeks.) increased amniotic fluid production marginal insertion of umbilical cord (Monitor growth WNL) sickle cell trait (Patient--FOB not involved, no information on [place] status.) vitamin D deficiency (Vitamin D = 21, on supplement) Bilateral EIFs noted, low risk genetic testing at 30 weeks. HSV 1+ (no lesion on spec, no prodromal s/sx.)   Subjective: Pt comfortable with epidural placement still, amnioinfusion going, Cat 2 strip with variables and prolonged decel to nadir of 90 noted, strip reviewed with RN and DR Charlesetta Garibaldi. Plan to keep going r/t cervical change to 6, will keep pitocin at 6, continue maternal position change and fluid bolus with amnioinfusion.  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 No intake/output data recorded. NST: FHR baseline 135 bpm, Variability: moderate,  Accelerations:present, Decelerations:  Present  occ varible = Cat 1/Reactive CTX:  irregular, every 3-10 minutes Uterus gravid, soft non tender, moderate to palpate with contractions.  SVE:  Dilation: 6 Effacement (%): 80 Station: 0 Exam by:: [facility], CNM Pitocin at (6) mUn/min IUPC placed with ease: MVU 65 Amnio in place with fluid return.   Assessment:  Veronica Mclean is a 26 y.o. female, G2P0100, IUP admitted at 35.4 weeks, currently 35.5 weeks presenting for PPROM over last couple days, heavier 1/6 @ 0500  with cxt with preterm labor now. Saw MFM today BPP 8/8. GBS unknown and pending, BMZ 1/6 given. 12/29 Growth, AFI 20.2  vertex, 4.15lb 23%, Pt endorse + Fm. Denies vaginal bleeding. Pt in active labor at 6cm now, IUPC for amnio continued, Dr Charlesetta Garibaldi consulted and reviewed cat 2 strip. .   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 infection, normal chromosomes.  Follow growth.  Per MFM, start antenatal testing at 30 weeks.) increased amniotic fluid production marginal insertion of umbilical cord (Monitor growth WNL) sickle cell trait (Patient--FOB not involved, no information on [place] status.) vitamin D deficiency (Vitamin D = 21, on supplement) Bilateral EIFs noted, low risk genetic testing at 30 weeks. 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   Membranes:  PPROM, clear 1/6 @ 0500 x 21hrs, no s/s  of infection  IUPC: MVU 170s, amnio has fluid return noted.   Induction:    Cytotec xN/A pt presented with 3cm, ripe  Foley Bulb: N/A  Pitocin - 6  Pain management:    IV pain management: x Fentanyl given for vaginal check and IUPC placement 50 mcg @ 0705  Nitrous:  PRN             Epidural placement: Placed @ 11 on 1/7  GBS Negative  Abx: PCN x1 dose given at that time GBS was unknown @ 2346 1/6  Preterm labor with PPROM: BMZ given 1/6 @ 2349   Plan: Continue labor plan Continuous monitoring Rest Frequent position changes to facilitate fetal rotation and descent. Will reassess with cervical exam at 4 hours or earlier if necessary Amnioinfusion continue 150 cc/hr. Continue pitocin but stay at 6 Next dose BMZ  1/7 @ 2300 Anticipate labor progression and vaginal delivery.   Noralyn Pick, NP-C, CNM, MSN 07/28/2021. 3:58 PM

## 2021-07-28 NOTE — Progress Notes (Addendum)
Labor Progress Note  Veronica Mclean is a 26 y.o. female, G2P0100, IUP admitted at 35.4 weeks, currently 35.5 weeks presenting for PPROM over last couple days, heavier 1/6 @ 0500  with cxt with preterm labor now. Saw MFM today BPP 8/8. GBS unknown and pending, BMZ 1/6 given. 12/29 Growth, AFI 20.2  vertex, 4.15lb 23%, Pt endorse + Fm. Denies vaginal bleeding.   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 infection, normal chromosomes.  Follow growth.  Per MFM, start antenatal testing at 30 weeks.) increased amniotic fluid production marginal insertion of umbilical cord (Monitor growth WNL) sickle cell trait (Patient--FOB not involved, no information on [place] status.) vitamin D deficiency (Vitamin D = 21, on supplement) Bilateral EIFs noted, low risk genetic testing at 30 weeks. HSV 1+ (no lesion on spec, no prodromal s/sx.)   Subjective: RN report a couple prolonged decels noted. @ 0422 21mins prolonged to nadir of 100s, @ 0441 prolonged to nadir of 95s x 2 mins, @0503  prolonged x2 mins to nadir 80s, @ 0628 prolong x3 mins to nadir of 90s, @ 0704 prolonged x3 mins to nadir of 90, during every prolong or occ variable fetus maintained moderate variability, fluid bolus and maternal position were performed with return to baseline. Pt is stable in bed, Consulted Dr Charlesetta Garibaldi and updated on strip, pt cxt and labor stalled, pt having occ cxt Q42mins, 1cm cervical change over last 12 hours noted, Dr Charlesetta Garibaldi recommended starting amnioinfusion and once fetus returns to baseline start pitocin 1x1 for augmention.  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 No intake/output data recorded. NST: FHR baseline 130 bpm, Variability: moderate, Accelerations:present, Decelerations:  Present  occ varible = Cat 1/Reactive CTX:  irregular, every 3-10 minutes Uterus gravid, soft non tender, moderate to palpate with contractions.  SVE:  Dilation: 4 Effacement (%): 80 Station: -2 Exam by:: [employer], CNM Pitocin at (Plan to start 1x1) mUn/min IUPC placed with ease: MVU 30 Amnio in place with fluid return.   Assessment:  Veronica Mclean is a 26 y.o. female, G2P0100, IUP admitted at 35.4 weeks, currently 35.5 weeks presenting for PPROM over last couple days, heavier 1/6 @ 0500  with cxt with preterm labor now. Saw MFM today BPP 8/8. GBS unknown and pending, BMZ 1/6 given. 12/29 Growth, AFI 20.2  vertex, 4.15lb 23%, Pt endorse + Fm. Denies vaginal bleeding. Pt in prodromal latent labor at 4cm now, with prolonged decels noted, DR Dillard consulted, IUPC for amnio placed.   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 infection, normal chromosomes.  Follow growth.  Per MFM, start antenatal testing at 30 weeks.) increased amniotic fluid production marginal insertion of umbilical cord (Monitor growth WNL) sickle cell trait (Patient--FOB not  involved, no information on [place] status.) vitamin D deficiency (Vitamin D = 21, on supplement) Bilateral EIFs noted, low risk genetic testing at 30 weeks. 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   Membranes:  PPROM, clear 1/6 @ 0500 x 21hrs, no s/s of infection  IUPC: MVU 30s, amnio has fluid return noted.   Induction:    Cytotec xN/A pt 3cm, ripe  Foley Bulb: N/A  Pitocin - Plan to start 1x1  Pain management:    IV pain management: x Fentanyl given for vaginal check and IUPC placement 50 mcg @ 0705  Nitrous:  PRN             Epidural placement: Plan over next 1-2 hours.   GBS Negative  Abx: PCN x1 dose given at that time GBS was unknown @ 2346 1/6  Preterm labor with PPROM: BMZ given 1/6 @ 2349   Plan: Continue labor plan Continuous monitoring Rest Ambulate Frequent position changes to facilitate fetal rotation and descent. Will reassess with cervical exam at 4 hours or earlier if necessary Amnioinfusion with bolus of 300 cc, then 150 cc/hr. Next dose BMZ  1/7 @ [id number] Anticipate labor progression and vaginal delivery.   Dr Charlesetta Garibaldi consulted and aware of strip and POC.   Noralyn Pick, NP-C, CNM, MSN 07/28/2021. 7:36 AM

## 2021-07-29 ENCOUNTER — Encounter (HOSPITAL_COMMUNITY): Payer: Self-pay | Admitting: Obstetrics and Gynecology

## 2021-07-29 LAB — CBC
HCT: 27.9 % — ABNORMAL LOW (ref 36.0–46.0)
Hemoglobin: 9.3 g/dL — ABNORMAL LOW (ref 12.0–15.0)
MCH: 28.6 pg (ref 26.0–34.0)
MCHC: 33.3 g/dL (ref 30.0–36.0)
MCV: 85.8 fL (ref 80.0–100.0)
Platelets: 156 10*3/uL (ref 150–400)
RBC: 3.25 MIL/uL — ABNORMAL LOW (ref 3.87–5.11)
RDW: 12.2 % (ref 11.5–15.5)
WBC: 15.4 10*3/uL — ABNORMAL HIGH (ref 4.0–10.5)
nRBC: 0 % (ref 0.0–0.2)

## 2021-07-29 LAB — RPR: RPR Ser Ql: NONREACTIVE

## 2021-07-29 MED ORDER — ONDANSETRON HCL 4 MG PO TABS: 4.0000 mg | ORAL_TABLET | ORAL | Status: DC | PRN

## 2021-07-29 MED ORDER — ONDANSETRON HCL 4 MG/2ML IJ SOLN: 4.0000 mg | INTRAMUSCULAR | Status: DC | PRN

## 2021-07-29 NOTE — Progress Notes (Signed)
PPD# 1 SVD w/ intact perineum Information for the patient's newborn:  Canela, Hach [id number]  female    Circumcision desires inpatient   S:   Reports feeling "great" Tolerating PO fluid and solids No nausea or vomiting Bleeding is light Pain controlled with  PO medications Up ad lib / ambulatory / voiding w/o difficulty Feeding:  pumping and bottle feeding     O:   VS: BP 110/69 (BP Location: Left Arm)    Pulse 78    Temp 98.4 F (36.9 C)    Resp 16    Ht 5\' 7"  (1.702 m)    Wt 81.6 kg    LMP 11/12/2020 (Approximate)    SpO2 100%    Breastfeeding Unknown    BMI 28.19 kg/m   LABS:  Recent Labs    07/27/21 2234 07/29/21 0607  WBC 10.1 15.4*  HGB 10.9* 9.3*  PLT 189 156   Blood type: --/--/B POS (01/06 2220) Rubella: Immune (06/27 0000)                      I&O: Intake/Output      01/07 0701 01/08 0700 01/08 0701 01/09 0700   I.V. (mL/kg) 0 (0)    Other 0    Total Intake(mL/kg) 0 (0)    Urine (mL/kg/hr) 2650 (1.4)    Blood 225    Total Output 2875    Net -2875         Urine Occurrence 2 x      Physical Exam: Alert and oriented X3 Lungs: Clear and unlabored Heart: regular rate and rhythm / no mumurs Abdomen: soft, non-tender, non-distended  Fundus: firm, non-tender, U-2 Perineum: intact Lochia: light, red Extremities: no edema, no calf pain, tenderness, or cords    A:  PPD # 1  Normal exam  P:  Routine post partum orders  Anticipate D/C on 07/30/2021   Plan reviewed w/ Dr. Thayer Ohm, MSN, CNM 07/29/2021, 12:10 PM

## 2021-07-29 NOTE — Anesthesia Postprocedure Evaluation (Signed)
Anesthesia Post Note  Patient: Veronica Mclean  Procedure(s) Performed: AN AD HOC LABOR EPIDURAL     Patient location during evaluation: Mother Baby Anesthesia Type: Epidural Level of consciousness: awake and alert Pain management: pain level controlled Vital Signs Assessment: post-procedure vital signs reviewed and stable Respiratory status: spontaneous breathing, nonlabored ventilation and respiratory function stable Cardiovascular status: stable Postop Assessment: no headache, no backache, epidural receding, no apparent nausea or vomiting, patient able to bend at knees, adequate PO intake and able to ambulate Anesthetic complications: no   No notable events documented.  Last Vitals:  Vitals:   07/29/21 0200 07/29/21 0605  BP: 113/65 115/61  Pulse: 75 74  Resp: 16   Temp: 36.7 C 36.8 C  SpO2: 100% 100%    Last Pain:  Vitals:   07/29/21 0730  TempSrc:   PainSc: Asleep   Pain Goal:                   Laban Emperor

## 2021-07-29 NOTE — Lactation Note (Signed)
This note was copied from a baby's chart. Lactation Consultation Note  Patient Name: Veronica Mclean [id number] Date: 07/29/2021 Reason for consult: Initial assessment;Primapara;1st time breastfeeding;Difficult latch;Late-preterm 34-36.6wks;Infant < 6lbs Age:27 hours  LC in to visit with P1 Mom of LPTI.  Baby weighed 5 lbs 6.8 oz at birth.  Baby has been STS on Mom, but has had difficulty latching and sustaining a latch to the breast.  Baby has been supplemented with drops of colostrum and 22 cal formula by spoon and paced bottle.  Baby showing some cues and offered to assist with positioning and latch.  Mom has large, heavy breasts and nipples are large and flat.  [place] is firm which tends to invert nipple when breast is sandwiched.  Mom has very sensitive nipples with hand expression.  Pre-pumped using hand pump and Mom wincing in pain.  Baby not showing any cues at the breast.    LPTI guideline handout provided to Mom, partner and GMOB.    Plan- 1- Keep baby STS as much as possible 2- At least every 3 hrs, awaken baby for feedings. 3- after breast attempt, Mom to supplement per LPTI guidelines (5-10 ml today) by paced bottle using Nfant standard nipple 4- Mom to double pump on initiation setting, using coconut oil for lubrication.  Mom to do breast massage and hand expression also.  Mom doesn't have a DEBP at home yet.  Explained about the STORK pump option as Mom has [employer].  Family is deciding.  Mom to ask for help prn.  Maternal Data Has patient been taught Hand Expression?: Yes Does the patient have breastfeeding experience prior to this delivery?: No  Feeding Mother's Current Feeding Choice: Breast Milk and Formula Nipple Type: Nfant Standard Flow (white)  LATCH Score Latch: Too sleepy or reluctant, no latch achieved, no sucking elicited.  Audible Swallowing: None  Type of Nipple: Flat  Comfort (Breast/Nipple): Filling, red/small blisters or bruises,  mild/mod discomfort  Hold (Positioning): Full assist, staff holds infant at breast  LATCH Score: 2   Lactation Tools Discussed/Used Tools: Pump;Flanges;Bottle Flange Size: 24 Breast pump type: Double-Electric Breast Pump;Manual Pump Education: Setup, frequency, and cleaning;Milk Storage Reason for Pumping: support milk supply/LPTI/<6 lbs Pumping frequency: Encouraged Mom to double pump every 2- 3 hrs or with baby's feedings Pumped volume: 0 mL (drops)  Interventions Interventions: Breast feeding basics reviewed;Assisted with latch;Skin to skin;Breast massage;Hand express;Pre-pump if needed;Breast compression;Adjust position;Support pillows;Position options;Hand pump;DEBP;Coconut oil;LC Services brochure;Pace feeding  Discharge Pump: Advised to call insurance company [facility] Program: No  Consult Status Consult Status: Follow-up Date: 07/30/21 Follow-up type: In-patient    Broadus John 07/29/2021, 11:03 AM

## 2021-07-30 ENCOUNTER — Other Ambulatory Visit (HOSPITAL_COMMUNITY): Payer: Self-pay

## 2021-07-30 MED ORDER — IBUPROFEN 600 MG PO TABS
600.0000 mg | ORAL_TABLET | Freq: Four times a day (QID) | ORAL | 3 refills | Status: DC | PRN
  Filled 2021-07-30: qty 60, 15d supply, fill #0

## 2021-07-30 NOTE — Lactation Note (Signed)
This note was copied from a baby's chart. Lactation Consultation Note  Patient Name: Veronica Mclean [id number] Date: 07/30/2021 Reason for consult: Follow-up assessment;Primapara;1st time breastfeeding;Infant < 6lbs Age:27 hours   P1 mother whose infant is now 51 hours old.  This is a LPTI at 35+5 weeks with a CGA of 36+0 weeks.  Baby is < 6 lbs.  Mother's current feeding preference is breast/formula.  She hopes to exclusively breast feed for 6 months.  Baby "[name]" had just finished feeding prior to my arrival.  RN in room.  Discussed current feeding plan.  Mother has not been latching to the breast but has provided formula using Similac 22 calorie formula and has been feeding at least every three hours.  Reviewed volume supplementation amounts for "[name]".  He has been feeding well using the Nfant nipple.    SLP to return at 1130 for a consult.  Asked mother to call for my assistance when SLP arrives.  Mother interested in attempting to latch.  Discussed total feeding times will be limited to 30 minutes or less.  Mother verbalized understanding.  Mother interested in having my assistance.  Mother does not currently have a DEBP for home use.  She informed me that she was told she would be discharged today.  Discussed the Stork pump option and mother willing to obtain a DEBP.  RN placed order for the pump.  Will return for the next feeding when mother calls.    Maternal Data Has patient been taught Hand Expression?: Yes Does the patient have breastfeeding experience prior to this delivery?: No  Feeding Mother's Current Feeding Choice: Breast Milk and Formula Nipple Type: Nfant Standard Flow (white)  LATCH Score                    Lactation Tools Discussed/Used Tools: Shells;Pump;Flanges;Coconut oil Flange Size: 24 Breast pump type: Double-Electric Breast Pump;Manual Pump Education: Setup, frequency, and cleaning (No review needed) Reason for Pumping: Breast stimulation  for supplementation for LPTI Pumping frequency: Every three hours Pumped volume: 20 mL  Interventions Interventions: Education  Discharge Pump: DEBP;Manual;Stork Pump (Order placed by RN this morning) WIC Program: No  Consult Status Consult Status: Follow-up Date: 07/31/21 Follow-up type: In-patient    Valentino Saavedra R Cheyanna Strick 07/30/2021, 9:26 AM

## 2021-07-30 NOTE — Progress Notes (Signed)
Spoke to Sheila at [phone] about ordering DEBP (Stork Pump) for mother/infant. (Per LC's recommendation) °

## 2021-07-30 NOTE — Discharge Summary (Signed)
[facility] Discharge Summary   Patient Name:   Veronica Mclean DOB:     1995-08-04 MRN:     [id number]  Date of Admission:   07/27/2021 Date of Discharge:  07/30/2021  Admitting diagnosis: Preterm premature rupture of membranes (PPROM) delivered, current hospitalization [O42.919] Principal Problem:   Preterm premature rupture of membranes (PPROM) delivered, current hospitalization Active Problems:   Preterm labor     Discharge diagnosis:   Preterm premature rupture of membranes (PPROM) delivered, current hospitalization [O42.919] Principal Problem:   Preterm premature rupture of membranes (PPROM) delivered, current hospitalization Active Problems:   Preterm labor  Additional problems: None                                          Post partum procedures: none Augmentation: Pitocin Complications: None  Hospital course: Onset of Labor With Vaginal Delivery      26 y.o. yo G2P0201 at 81w5dwas admitted in Active Labor on 07/27/2021. Patient had an uncomplicated labor course as follows:  Membrane Rupture Time/Date: 5:00 AM ,07/27/2021   Delivery Method:Vaginal, Spontaneous  Episiotomy: None  Lacerations:    Patient had an uncomplicated postpartum course.  She is ambulating, tolerating a regular diet, passing flatus, and urinating well. Patient is discharged home in stable condition on 07/30/21.  Newborn Data: Birth date:07/28/2021  Birth time:5:07 PM  Gender:Female  Living status:Living  Apgars:8 ,9  Weight:2460 g   Magnesium Sulfate received: No BMZ received: Yes Rhophylac:No MMR:No T-DaP: declined Flu: No Transfusion:No                                                               Type of Delivery:  NSVD Delivering Provider: MNoralyn Pick Date of Delivery:  07/28/2021  Newborn Data:  Baby Feeding:   Breast Disposition:   rooming in  Physical Exam:   Vitals:   07/29/21 0605 07/29/21 1028 07/29/21 2118 07/30/21 0527  BP: 115/61 110/69 114/61  110/68  Pulse: 74 78 75 67  Resp:  _0 Temp: 98.2 F (36.8 C) 98.4 F (36.9 C) 98 F (36.7 C) 98.2 F (36.8 C)  TempSrc: Oral  Oral Oral  SpO2: 100% 100% 97% 100%  Weight:      Height:       General: alert, cooperative, and no distress Lochia: appropriate Uterine Fundus: firm Incision: N/A DVT Evaluation: No evidence of DVT seen on physical exam. Negative Homan's sign. No cords or calf tenderness.  Labs: Lab Results  Component Value Date   WBC 15.4 (H) 07/29/2021   HGB 9.3 (L) 07/29/2021   HCT 27.9 (L) 07/29/2021   MCV 85.8 07/29/2021   PLT 156 07/29/2021   CMP Latest Ref Rng & Units 09/28/2020  Glucose 70 - 99 mg/dL 85  BUN 6 - 23 mg/dL 6  Creatinine 0.40 - 1.20 mg/dL 0.81  Sodium 135 - 145 mEq/L 135  Potassium 3.5 - 5.1 mEq/L 3.8  Chloride 96 - 112 mEq/L 100  CO2 19 - 32 mEq/L 30  Calcium 8.4 - 10.5 mg/dL 9.8  Total Protein 6.0 - 8.3 g/dL 8.3  Total Bilirubin 0.2 - 1.2 mg/dL 0.5  Phos 39 - 117 U/L 42  °AST 0 - 37 U/L 15  °ALT 0 - 35 U/L 15  ° ° °Discharge instruction: per After Visit Summary and "Baby and Me Booklet". ° °After Visit Meds:  °Allergies as of 07/30/2021   °No Known Allergies °  ° °  °Medication List  °  ° °TAKE these medications   ° °ibuprofen 600 MG tablet °Commonly known as: ADVIL °Take 1 tablet (600 mg total) by mouth every 6 (six) hours as needed for cramping or moderate pain. °  °PRENATAL VITAMINS PO °Take by mouth. °  °terconazole 0.4 % vaginal cream °Commonly known as: TERAZOL 7 °Place 1 applicator vaginally at bedtime. °  ° °  ° °  °  ° ° °  °Durable Medical Equipment  °(From admission, onward)  °  ° ° °  ° °  Start     Ordered  ° 07/30/21 0919  For home use only DME double electric breast pump  Once       °Comments: Z39.1  ° 07/30/21 0918  ° °  °  ° °  ° ° °Diet: routine diet ° °Activity: Advance as tolerated. Pelvic rest for 6 weeks.  ° °Outpatient follow up:6 weeks °Follow up Appt: °Future Appointments  °Date Time Provider Department  Center  °08/03/2021 11:00 AM [facility] NURSE [facility]  °08/03/2021 11:15 AM [facility] US2 [facility]  °08/10/2021  2:45 PM [facility] NURSE [facility]  °08/10/2021  3:00 PM [facility] US1 [facility]  °08/17/2021  3:00 PM [facility] NURSE [facility]  °08/17/2021  3:15 PM [facility] US2 [facility]  ° °Follow up visit: No follow-ups on file. ° °Postpartum contraception: Not Discussed ° °07/30/2021 °Walda Pinn, MD ° °  °

## 2021-07-31 ENCOUNTER — Ambulatory Visit: Payer: Self-pay

## 2021-07-31 LAB — SURGICAL PATHOLOGY

## 2021-07-31 NOTE — Lactation Note (Signed)
This note was copied from a baby's chart. Lactation Consultation Note  Patient Name: Veronica Mclean IRJJO'A Date: 07/31/2021 Reason for consult: Follow-up assessment;1st time breastfeeding;Late-preterm 34-36.6wks Age:27 hours  Baby [redacted]w[redacted]d. < 6 lbs. Mother bottle feeding breastmilk upon entering.  Now pumping 70 ml per session. Discussed increasing per day of life and as baby desires. Mother has stork pump in room.   Discussed milk storage and pumping with larger bottles.   Feeding Mother's Current Feeding Choice: Breast Milk and Formula Nipple Type: Nfant Standard Flow (white)   Lactation Tools Discussed/Used Tools: Pump Flange Size: 24 Breast pump type: Double-Electric Breast Pump Reason for Pumping: stimulation and supplementation Pumping frequency: q 3 hours and with cues Pumped volume: 70 mL  Interventions Interventions: Education;DEBP  Discharge Pump: Stork Pump  Consult Status Consult Status: Follow-up Date: 08/01/21    Dahlia Byes [facility] 07/31/2021, 10:44 AM

## 2021-08-03 ENCOUNTER — Ambulatory Visit: Payer: BC Managed Care – PPO

## 2021-08-03 ENCOUNTER — Other Ambulatory Visit: Payer: BC Managed Care – PPO

## 2021-08-10 ENCOUNTER — Ambulatory Visit: Payer: BC Managed Care – PPO

## 2021-08-11 ENCOUNTER — Telehealth (HOSPITAL_COMMUNITY): Payer: Self-pay

## 2021-08-11 NOTE — Telephone Encounter (Signed)
No answer. No voicemail set up yet.  Sharyn Lull [facility] 08/11/2021,1429

## 2021-08-17 ENCOUNTER — Ambulatory Visit: Payer: BC Managed Care – PPO

## 2021-08-23 ENCOUNTER — Encounter: Payer: Self-pay | Admitting: Adult Health

## 2021-11-20 ENCOUNTER — Encounter (HOSPITAL_BASED_OUTPATIENT_CLINIC_OR_DEPARTMENT_OTHER): Payer: Self-pay

## 2021-11-20 ENCOUNTER — Other Ambulatory Visit: Payer: Self-pay

## 2021-11-20 ENCOUNTER — Emergency Department (HOSPITAL_BASED_OUTPATIENT_CLINIC_OR_DEPARTMENT_OTHER): Payer: BC Managed Care – PPO

## 2021-11-20 ENCOUNTER — Emergency Department (HOSPITAL_COMMUNITY)
Admission: EM | Admit: 2021-11-20 | Discharge: 2021-11-20 | Discharge status: Against Medical Advice | Payer: BC Managed Care – PPO | Source: Home / Self Care

## 2021-11-20 ENCOUNTER — Emergency Department (HOSPITAL_BASED_OUTPATIENT_CLINIC_OR_DEPARTMENT_OTHER)
Admission: EM | Admit: 2021-11-20 | Discharge: 2021-11-20 | Disposition: A | Discharge status: Home / Self Care | Payer: BC Managed Care – PPO | Attending: Emergency Medicine | Admitting: Emergency Medicine

## 2021-11-20 ENCOUNTER — Telehealth: Payer: Self-pay

## 2021-11-20 DIAGNOSIS — M799 Soft tissue disorder, unspecified: Secondary | ICD-10-CM | POA: Insufficient documentation

## 2021-11-20 DIAGNOSIS — R079 Chest pain, unspecified: Secondary | ICD-10-CM

## 2021-11-20 DIAGNOSIS — U071 COVID-19: Secondary | ICD-10-CM | POA: Diagnosis not present

## 2021-11-20 DIAGNOSIS — R059 Cough, unspecified: Secondary | ICD-10-CM | POA: Diagnosis present

## 2021-11-20 LAB — RESP PANEL BY RT-PCR (FLU A&B, COVID) ARPGX2
Influenza A by PCR: NEGATIVE
Influenza B by PCR: NEGATIVE
SARS Coronavirus 2 by RT PCR: POSITIVE — AB

## 2021-11-20 LAB — CBC
HCT: 40 % (ref 36.0–46.0)
Hemoglobin: 13 g/dL (ref 12.0–15.0)
MCH: 26.7 pg (ref 26.0–34.0)
MCHC: 32.5 g/dL (ref 30.0–36.0)
MCV: 82.1 fL (ref 80.0–100.0)
Platelets: 149 10*3/uL — ABNORMAL LOW (ref 150–400)
RBC: 4.87 MIL/uL (ref 3.87–5.11)
RDW: 15.1 % (ref 11.5–15.5)
WBC: 5 10*3/uL (ref 4.0–10.5)
nRBC: 0 % (ref 0.0–0.2)

## 2021-11-20 LAB — BASIC METABOLIC PANEL
Anion gap: 8 (ref 5–15)
BUN: 7 mg/dL (ref 6–20)
CO2: 26 mmol/L (ref 22–32)
Calcium: 9.7 mg/dL (ref 8.9–10.3)
Chloride: 105 mmol/L (ref 98–111)
Creatinine, Ser: 0.84 mg/dL (ref 0.44–1.00)
GFR, Estimated: >60 mL/min (ref >60)
Glucose, Bld: 84 mg/dL (ref 70–99)
Potassium: 3.7 mmol/L (ref 3.5–5.1)
Sodium: 139 mmol/L (ref 135–145)

## 2021-11-20 LAB — TROPONIN I (HIGH SENSITIVITY): Troponin I (High Sensitivity): <2 ng/L (ref <18)

## 2021-11-20 LAB — PREGNANCY, URINE: Preg Test, Ur: NEGATIVE

## 2021-11-20 NOTE — ED Notes (Signed)
Pt mild distress in facial expressions. A/ox4, c/o intermittent 7/10 left chest ache x 3-4 days. +pain increase on inspiration, states has been coughing. Pt also endorses left arm "feeling funny" from time to time. Pt states when she takes Motrin, all pain subsides.  ?

## 2021-11-20 NOTE — ED Notes (Signed)
Pt NAD, a/ox4. Pt verbalizes understanding of all DC and f/u instructions. All questions answered. Pt walks with steady gait to lobby at DC with family member  

## 2021-11-20 NOTE — Telephone Encounter (Signed)
Patient calling in with  respiratory symptoms: ? Shortness of breath, chest pain, palpitations or other red words send to Triage ? ?Does the patient have a fever over 100 or positive COVID  test within the last 5 days?  ? ? ? ?Does the patient have 3 or more of the following symptoms?  ?Cough, runny nose ? ?Yes, pt is scheduled for VV  ?If no availability for virtual visit in office,  please schedule another [place] office ? ?Pt will arrive 30 mins before visit to be tested for COVID ?

## 2021-11-20 NOTE — ED Triage Notes (Signed)
Chest pain x 3 days with cough, back pain, left side pain, left arm pain x 5 days. Pt states she feels a lump on left chest that is not painful to touch.  ?

## 2021-11-20 NOTE — ED Notes (Signed)
Pt requesting to go home, states she wants to know if she needs the CT scan. MD made aware of pt questions. MD to bedside to speak with pt ?

## 2021-11-20 NOTE — Telephone Encounter (Signed)
--  caller states she has a cough, pain when taking a ?deep breath. says her arm went numb last week. it ?started feeling funny/weird. feels a lump between ?breast and axillary like fluid ? ?11/19/2021 1:39:23 PM Go to ED Now [place], RN, Estill Bamberg ? ?Comments ?User: Rozelle Logan, RN Date/Time Eilene Ghazi Time): 11/19/2021 1:39:50 PM ?understands to go to ER now. going to [place], has someone that will take her ? ?Referrals ?[facility] - ED ?

## 2021-11-20 NOTE — ED Provider Notes (Signed)
?[facility] EMERGENCY DEPARTMENT ?Provider Note ? ? ?CSN: [id number] ?Arrival date & time: 11/20/21  1923 ? ?  ? ?History ? ?Chief Complaint  ?Patient presents with  ? Chest Pain  ? Cough  ? ? ?Veronica Mclean is a 26 y.o. female. ? ? ?Chest Pain ?Associated symptoms: cough   ?Cough ?Associated symptoms: chest pain   ?Patient presents with cough chest pain and headache.  Feeling bad.  Some pain in left upper chest.  Mildly pleuritic.  No known sick contacts.  Says symptoms for 5 days.  Also worried that there is some swelling by her left shoulder.  Mild tenderness in this area.  No swelling in her legs.  No known sick contacts.  Patient does smoke. ?  ? ?Home Medications ?Prior to Admission medications   ?Medication Sig Start Date End Date Taking? Authorizing Provider  ?ibuprofen (ADVIL) 600 MG tablet Take 1 tablet (600 mg total) by mouth every 6 (six) hours as needed for cramping or moderate pain. 07/30/21   Essie Hart, MD  ?Prenatal Vit-Fe Fumarate-FA (PRENATAL VITAMINS PO) Take 1 tablet by mouth daily.    [provider]  ?terconazole (TERAZOL 7) 0.4 % vaginal cream Place 1 applicator vaginally at bedtime as needed (vaginal irritation).    [provider]  ?   ? ?Allergies    ?Patient has no known allergies.   ? ?Review of Systems   ?Review of Systems  ?Respiratory:  Positive for cough.   ?Cardiovascular:  Positive for chest pain.  ? ?Physical Exam ?Updated Vital Signs ?BP 121/88   Pulse 68   Temp 98.4 ?F (36.9 ?C) (Oral)   Resp 13   Ht 5\' 7"  (1.702 m)   Wt 63.5 kg   LMP 11/06/2021 (Approximate)   SpO2 100%   BMI 21.93 kg/m?  ?Physical Exam ?Vitals and nursing note reviewed.  ?Cardiovascular:  ?   Rate and Rhythm: Normal rate and regular rhythm.  ?Pulmonary:  ?   Breath sounds: No decreased breath sounds.  ?Chest:  ?   Chest wall: Tenderness present.  ?   Comments: Some tenderness to left upper lateral chest wall.  No crepitance.  No deformity. ?Abdominal:  ?   Tenderness:  There is no abdominal tenderness.  ?Musculoskeletal:  ?   Right lower leg: No edema.  ?   Left lower leg: No edema.  ?Skin: ?   General: Skin is warm.  ?Neurological:  ?   Mental Status: She is alert.  ? ? ?ED Results / Procedures / Treatments   ?Labs ?(all labs ordered are listed, but only abnormal results are displayed) ?Labs Reviewed  ?RESP PANEL BY RT-PCR (FLU A&B, COVID) ARPGX2 - Abnormal; Notable for the following components:  ?    Result Value  ? SARS Coronavirus 2 by RT PCR POSITIVE (*)   ? All other components within normal limits  ?CBC - Abnormal; Notable for the following components:  ? Platelets 149 (*)   ? All other components within normal limits  ?BASIC METABOLIC PANEL  ?PREGNANCY, URINE  ?TROPONIN I (HIGH SENSITIVITY)  ? ? ?EKG ?EKG Interpretation ? ?Date/Time:  Tuesday Nov 20 2021 19:36:32 EDT ?Ventricular Rate:  75 ?PR Interval:  140 ?QRS Duration: 70 ?QT Interval:  392 ?QTC Calculation: 437 ?R Axis:   74 ?Text Interpretation: Normal sinus rhythm Normal ECG No previous ECGs available Confirmed by 09-08-1995 [phone]) on 11/20/2021 8:49:03 PM ? ?Radiology ?DG Chest 2 View ? ?Result Date: 11/20/2021 ?CLINICAL DATA:  Chest pain and cough x3 days. EXAM: CHEST - 2 VIEW COMPARISON:  None Available. FINDINGS: The heart size and mediastinal contours are within normal limits. Both lungs are clear. The visualized skeletal structures are unremarkable. IMPRESSION: No active cardiopulmonary disease. Electronically Signed   By: Aram Candela M.D.   On: 11/20/2021 20:20   ? ?Procedures ?Procedures  ? ? ?Medications Ordered in ED ?Medications - No data to display ? ?ED Course/ Medical Decision Making/ A&P ?  ?                        ?Medical Decision Making ?Amount and/or Complexity of Data Reviewed ?Labs: ordered. ?Radiology: ordered. ? ? ?Patient presents with cough shortness of breath myalgias and headache.  Found to be COVID-positive.  Chest x-ray independently interpreted.  Left chest has some tenderness  over the musculature.  Breast abnormality felt less likely but not completely ruled out.  Patient is not hypoxic and not tachycardic.  There was 1 reading of pulse ox of 80% was documented but appears to be an error.  However with the patient smoking and COVID she is high risk for blood clots.  With the pleuritic aspect of the pain CT scan was ordered.  However patient was not willing to stay for it.  Discussed with patient and her mother.  Overall rather low risk in terms of vital signs, but with the risk factors of smoking and COVID on the imaging was indicated.  States they will follow-up with her doctor tomorrow and are hoping he will be able to order it.  Will discharge home.  Patient has had symptoms for over 5 days and is not a candidate for antivirals ? ? ? ? ? ? ? ?Final Clinical Impression(s) / ED Diagnoses ?Final diagnoses:  ?COVID  ?Nonspecific chest pain  ? ? ?Rx / DC Orders ?ED Discharge Orders   ? ? None  ? ?  ? ? ?  ?Benjiman Core, MD ?11/20/21 2355 ? ?

## 2021-11-20 NOTE — Discharge Instructions (Signed)
Follow-up with your doctor tomorrow as planned.  We had planned to do a CT scan to look for blood clots. ?

## 2021-11-21 ENCOUNTER — Telehealth: Payer: BC Managed Care – PPO | Admitting: Adult Health

## 2022-04-09 ENCOUNTER — Encounter: Payer: Self-pay | Admitting: Adult Health

## 2022-04-19 ENCOUNTER — Encounter: Payer: BC Managed Care – PPO | Admitting: Adult Health

## 2022-09-17 ENCOUNTER — Emergency Department (HOSPITAL_BASED_OUTPATIENT_CLINIC_OR_DEPARTMENT_OTHER)
Admission: EM | Admit: 2022-09-17 | Discharge: 2022-09-17 | Disposition: A | Discharge status: Home / Self Care | Payer: BC Managed Care – PPO | Attending: Emergency Medicine | Admitting: Emergency Medicine

## 2022-09-17 ENCOUNTER — Encounter (HOSPITAL_BASED_OUTPATIENT_CLINIC_OR_DEPARTMENT_OTHER): Payer: Self-pay | Admitting: Radiology

## 2022-09-17 ENCOUNTER — Emergency Department (HOSPITAL_BASED_OUTPATIENT_CLINIC_OR_DEPARTMENT_OTHER): Payer: BC Managed Care – PPO

## 2022-09-17 DIAGNOSIS — R1013 Epigastric pain: Secondary | ICD-10-CM

## 2022-09-17 DIAGNOSIS — D649 Anemia, unspecified: Secondary | ICD-10-CM | POA: Diagnosis not present

## 2022-09-17 DIAGNOSIS — E871 Hypo-osmolality and hyponatremia: Secondary | ICD-10-CM | POA: Insufficient documentation

## 2022-09-17 LAB — PREGNANCY, URINE: Preg Test, Ur: NEGATIVE

## 2022-09-17 LAB — COMPREHENSIVE METABOLIC PANEL
ALT: 20 U/L (ref 0–44)
AST: 19 U/L (ref 15–41)
Albumin: 4 g/dL (ref 3.5–5.0)
Alkaline Phosphatase: 42 U/L (ref 38–126)
Anion gap: 5 (ref 5–15)
BUN: 8 mg/dL (ref 6–20)
CO2: 26 mmol/L (ref 22–32)
Calcium: 8.7 mg/dL — ABNORMAL LOW (ref 8.9–10.3)
Chloride: 102 mmol/L (ref 98–111)
Creatinine, Ser: 0.78 mg/dL (ref 0.44–1.00)
GFR, Estimated: >60 mL/min (ref >60)
Glucose, Bld: 99 mg/dL (ref 70–99)
Potassium: 3.5 mmol/L (ref 3.5–5.1)
Sodium: 133 mmol/L — ABNORMAL LOW (ref 135–145)
Total Bilirubin: 0.5 mg/dL (ref 0.3–1.2)
Total Protein: 7.7 g/dL (ref 6.5–8.1)

## 2022-09-17 LAB — URINALYSIS, ROUTINE W REFLEX MICROSCOPIC
Bilirubin Urine: NEGATIVE
Glucose, UA: NEGATIVE mg/dL
Hgb urine dipstick: NEGATIVE
Ketones, ur: NEGATIVE mg/dL
Leukocytes,Ua: NEGATIVE
Nitrite: NEGATIVE
Protein, ur: NEGATIVE mg/dL
Specific Gravity, Urine: 1.015 (ref 1.005–1.030)
pH: 7 (ref 5.0–8.0)

## 2022-09-17 LAB — CBC
HCT: 35.2 % — ABNORMAL LOW (ref 36.0–46.0)
Hemoglobin: 11.4 g/dL — ABNORMAL LOW (ref 12.0–15.0)
MCH: 26.5 pg (ref 26.0–34.0)
MCHC: 32.4 g/dL (ref 30.0–36.0)
MCV: 81.7 fL (ref 80.0–100.0)
Platelets: 201 10*3/uL (ref 150–400)
RBC: 4.31 MIL/uL (ref 3.87–5.11)
RDW: 14.6 % (ref 11.5–15.5)
WBC: 4.5 10*3/uL (ref 4.0–10.5)
nRBC: 0 % (ref 0.0–0.2)

## 2022-09-17 LAB — LIPASE, BLOOD: Lipase: 42 U/L (ref 11–51)

## 2022-09-17 MED ORDER — IOHEXOL 300 MG/ML  SOLN
100.0000 mL | Freq: Once | INTRAMUSCULAR | Status: AC | PRN
  Administered 2022-09-17: 100 mL via INTRAVENOUS

## 2022-09-17 MED ORDER — ONDANSETRON HCL 4 MG/2ML IJ SOLN
4.0000 mg | Freq: Once | INTRAMUSCULAR | Status: AC
  Administered 2022-09-17: 4 mg via INTRAVENOUS
  Filled 2022-09-17: qty 2

## 2022-09-17 MED ORDER — PANTOPRAZOLE SODIUM 20 MG PO TBEC: 20.0000 mg | DELAYED_RELEASE_TABLET | Freq: Every day | ORAL | 0 refills | Status: DC

## 2022-09-17 MED ORDER — ALUM & MAG HYDROXIDE-SIMETH 200-200-20 MG/5ML PO SUSP
30.0000 mL | Freq: Once | ORAL | Status: AC
  Administered 2022-09-17: 30 mL via ORAL
  Filled 2022-09-17: qty 30

## 2022-09-17 MED ORDER — FAMOTIDINE 20 MG PO TABS
20.0000 mg | ORAL_TABLET | Freq: Once | ORAL | Status: AC
  Administered 2022-09-17: 20 mg via ORAL
  Filled 2022-09-17: qty 1

## 2022-09-17 MED ORDER — PANTOPRAZOLE SODIUM 20 MG PO TBEC: 20.0000 mg | DELAYED_RELEASE_TABLET | Freq: Every day | ORAL | 2 refills | Status: DC

## 2022-09-17 MED ORDER — SODIUM CHLORIDE 0.9 % IV BOLUS
1000.0000 mL | Freq: Once | INTRAVENOUS | Status: AC
  Administered 2022-09-17: 1000 mL via INTRAVENOUS

## 2022-09-17 NOTE — Discharge Instructions (Addendum)
As discussed, laboratory studies were reassuring.  CT imaging of the abdomen and pelvis was without abnormality.  Possible gastritis versus peptic ulcer disease versus duodenal ulcer disease.  See information attached for medicines/foods/liquids to avoid.  Recommend adding reflux medicine in the form of Protonix to take daily.  See information attached for follow-up with GI.  Please do not hesitate to return to emergency department the worrisome signs and symptoms we discussed become apparent.

## 2022-09-17 NOTE — ED Provider Notes (Signed)
[place] EMERGENCY DEPARTMENT AT [facility] Provider Note   CSN: [id number] Arrival date & time: 09/17/22  1228     History  Chief Complaint  Patient presents with   Abdominal Pain    Veronica Mclean is a 27 y.o. female.   Abdominal Pain   27 year old female presents emergency department complaints of abdominal pain.  Reports abdominal pain in epigastric region with some radiation to her back.  Describes symptoms as beginning this past Saturday and has been intermittent in nature up until yesterday when it became constant.  States that symptoms are exacerbating when not eating and relieved with eating.  States that she drank alcohol prior to symptom onset.  Denies fever, emesis but has felt nauseous.  Denies chest pain, shortness of breath, urinary/vaginal symptoms, change in bowel habits.  No significant pertinent past medical history. Home Medications Prior to Admission medications   Medication Sig Start Date End Date Taking? Authorizing Provider  ibuprofen (ADVIL) 600 MG tablet Take 1 tablet (600 mg total) by mouth every 6 (six) hours as needed for cramping or moderate pain. 07/30/21   Sanjuana Kava, MD  pantoprazole (PROTONIX) 20 MG tablet Take 1 tablet (20 mg total) by mouth daily. 09/17/22   Wilnette Kales, PA  Prenatal Vit-Fe Fumarate-FA (PRENATAL VITAMINS PO) Take 1 tablet by mouth daily.    [provider]  terconazole (TERAZOL 7) 0.4 % vaginal cream Place 1 applicator vaginally at bedtime as needed (vaginal irritation).    [provider]      Allergies    Patient has no known allergies.    Review of Systems   Review of Systems  Gastrointestinal:  Positive for abdominal pain.  All other systems reviewed and are negative.   Physical Exam Updated Vital Signs BP 105/66   Pulse 64   Temp 98.3 F (36.8 C)   Resp 17   SpO2 100%  Physical Exam Vitals and nursing note reviewed.  Constitutional:      General: She is not in  acute distress.    Appearance: She is well-developed.  HENT:     Head: Normocephalic and atraumatic.  Eyes:     Conjunctiva/sclera: Conjunctivae normal.  Cardiovascular:     Rate and Rhythm: Normal rate and regular rhythm.     Heart sounds: No murmur heard. Pulmonary:     Effort: Pulmonary effort is normal. No respiratory distress.     Breath sounds: Normal breath sounds.  Abdominal:     Palpations: Abdomen is soft.     Tenderness: There is abdominal tenderness in the epigastric area. There is no right CVA tenderness or left CVA tenderness. Negative signs include Murphy's sign and McBurney's sign.  Musculoskeletal:        General: No swelling.     Cervical back: Neck supple.  Skin:    General: Skin is warm and dry.     Capillary Refill: Capillary refill takes less than 2 seconds.  Neurological:     Mental Status: She is alert.  Psychiatric:        Mood and Affect: Mood normal.     ED Results / Procedures / Treatments   Labs (all labs ordered are listed, but only abnormal results are displayed) Labs Reviewed  COMPREHENSIVE METABOLIC PANEL - Abnormal; Notable for the following components:      Result Value   Sodium 133 (*)    Calcium 8.7 (*)    All other components within normal limits  CBC -  Abnormal; Notable for the following components:   Hemoglobin 11.4 (*)    HCT 35.2 (*)    All other components within normal limits  LIPASE, BLOOD  URINALYSIS, ROUTINE W REFLEX MICROSCOPIC  PREGNANCY, URINE    EKG None  Radiology CT Abdomen Pelvis W Contrast  Result Date: 09/17/2022 CLINICAL DATA:  Evaluate for pancreatitis. Upper abdominal and right-sided abdominal pain since last week. EXAM: CT ABDOMEN AND PELVIS WITH CONTRAST TECHNIQUE: Multidetector CT imaging of the abdomen and pelvis was performed using the standard protocol following bolus administration of intravenous contrast. RADIATION DOSE REDUCTION: This exam was performed according to the departmental  dose-optimization program which includes automated exposure control, adjustment of the mA and/or kV according to patient size and/or use of iterative reconstruction technique. CONTRAST:  153m OMNIPAQUE IOHEXOL 300 MG/ML  SOLN COMPARISON:  None Available. FINDINGS: Lower chest: No acute abnormality. Hepatobiliary: No focal liver abnormality is seen. No gallstones, gallbladder wall thickening, or biliary dilatation. Pancreas: Unremarkable. No pancreatic ductal dilatation or surrounding inflammatory changes. Spleen: Normal in size without focal abnormality. Adrenals/Urinary Tract: Normal adrenal glands. No nephrolithiasis, hydronephrosis or kidney mass. Urinary bladder is unremarkable. Stomach/Bowel: Stomach appears normal. The appendix is visualized and appears normal. No bowel wall thickening, inflammation, or distension. Vascular/Lymphatic: No significant vascular findings are present. No enlarged abdominal or pelvic lymph nodes. Reproductive: Uterus and bilateral adnexa are unremarkable. Other: No free fluid or fluid collections identified. Musculoskeletal: No acute or significant osseous findings. IMPRESSION: No acute findings within the abdomen or pelvis. Electronically Signed   By: TKerby MoorsM.D.   On: 09/17/2022 15:16    Procedures Procedures    Medications Ordered in ED Medications  alum & mag hydroxide-simeth (MAALOX/MYLANTA) 200-200-20 MG/5ML suspension 30 mL (30 mLs Oral Given 09/17/22 1309)  famotidine (PEPCID) tablet 20 mg (20 mg Oral Given 09/17/22 1309)  sodium chloride 0.9 % bolus 1,000 mL (0 mLs Intravenous Stopped 09/17/22 1444)  ondansetron (ZOFRAN) injection 4 mg (4 mg Intravenous Given 09/17/22 1309)  iohexol (OMNIPAQUE) 300 MG/ML solution 100 mL (100 mLs Intravenous Contrast Given 09/17/22 1448)    ED Course/ Medical Decision Making/ A&P                             Medical Decision Making Amount and/or Complexity of Data Reviewed Labs: ordered. Radiology:  ordered.  Risk OTC drugs. Prescription drug management.   This patient presents to the ED for concern of abdominal pain, this involves an extensive number of treatment options, and is a complaint that carries with it a high risk of complications and morbidity.  The differential diagnosis includes gastritis, PUD/DU D, pancreatitis, CBD pathology, cholecystitis, SBO/LBO, volvulus, appendicitis, diverticulitis, nephrolithiasis, pyelonephritis, cystitis, ectopic pregnancy, ovarian torsion, IBS   Co morbidities that complicate the patient evaluation  See HPI   Additional history obtained:  Additional history obtained from EMR External records from outside source obtained and reviewed including hospital records   Lab Tests:  I Ordered, and personally interpreted labs.  The pertinent results include: No leukocytosis noted.  Mild evidence of anemia with a hemoglobin of 11.4 of which seems to be near patient's baseline.  Mild hyponatremia and hypocalcemia otherwise, electrolytes within normal limits; patient given 1 L normal saline while in the emergency department.  No transaminitis.  No renal dysfunction.  Lipase within normal limits.  UA without abnormality.  Urine pregnancy negative.   Imaging Studies ordered:  I ordered imaging studies including CT  abdomen pelvis I independently visualized and interpreted imaging which showed no acute findings in abdomen or pelvis I agree with the radiologist interpretation   Cardiac Monitoring: / EKG:  The patient was maintained on a cardiac monitor.  I personally viewed and interpreted the cardiac monitored which showed an underlying rhythm of: Sinus rhythm   Consultations Obtained:  N/a   Problem List / ED Course / Critical interventions / Medication management  Epigastric abdominal pain I ordered medication including Zofran for nausea, Pepcid, Maalox for GI cocktail, 1 L normal saline   Reevaluation of the patient after these medicines  showed that the patient improved I have reviewed the patients home medicines and have made adjustments as needed   Social Determinants of Health:  Cigarette use.  Denies illicit drug use.   Test / Admission - Considered:  Epigastric abdominal pain Vitals signs within normal range and stable throughout visit. Laboratory/imaging studies significant for: See above Patient workup today overall reassuring.  No acute abnormalities found on CT abdomen pelvis.  Patient afebrile, no leukocytosis and generally well-appearing.  Patient tolerated p.o. and found some relief of symptoms with administration of GI cocktail.  Some concern for symptoms being related to duodenal ulcer disease versus peptic ulcer disease versus gastritis.  Will treat with Protonix outpatient and recommend dietary/lifestyle modification changes for presumed condition.  Follow-up with gastroenterology recommended outpatient for reassessment.  Treatment plan discussed at length with patient and she acknowledged understanding was agreeable to said plan. Worrisome signs and symptoms were discussed with the patient, and the patient acknowledged understanding to return to the ED if noticed. Patient was stable upon discharge.          Final Clinical Impression(s) / ED Diagnoses Final diagnoses:  Epigastric abdominal pain    Rx / DC Orders ED Discharge Orders          Ordered    pantoprazole (PROTONIX) 20 MG tablet  Daily,   Status:  Discontinued        09/17/22 1529    pantoprazole (PROTONIX) 20 MG tablet  Daily        09/17/22 1530              Wilnette Kales, [place] 09/17/22 1615    Cristie Hem, MD 09/18/22 [phone]

## 2022-09-17 NOTE — ED Triage Notes (Signed)
Pt reports Upper abdominal pain and right side pain since Saturday. Pain is continuous pressure located epigastric pain Pt reports diarrhea but contributes to eating collard greens

## 2023-01-06 ENCOUNTER — Emergency Department
Admission: EM | Admit: 2023-01-06 | Discharge: 2023-01-06 | Disposition: A | Discharge status: Home / Self Care | Payer: BC Managed Care – PPO | Attending: Emergency Medicine | Admitting: Emergency Medicine

## 2023-01-06 ENCOUNTER — Emergency Department: Payer: BC Managed Care – PPO

## 2023-01-06 DIAGNOSIS — R197 Diarrhea, unspecified: Secondary | ICD-10-CM | POA: Diagnosis not present

## 2023-01-06 DIAGNOSIS — R109 Unspecified abdominal pain: Secondary | ICD-10-CM | POA: Diagnosis present

## 2023-01-06 DIAGNOSIS — I959 Hypotension, unspecified: Secondary | ICD-10-CM | POA: Diagnosis not present

## 2023-01-06 DIAGNOSIS — A040 Enteropathogenic Escherichia coli infection: Secondary | ICD-10-CM

## 2023-01-06 DIAGNOSIS — R1084 Generalized abdominal pain: Secondary | ICD-10-CM | POA: Diagnosis not present

## 2023-01-06 DIAGNOSIS — R1111 Vomiting without nausea: Secondary | ICD-10-CM | POA: Diagnosis not present

## 2023-01-06 LAB — COMPREHENSIVE METABOLIC PANEL
ALT: 28 U/L (ref 0–44)
AST: 39 U/L (ref 15–41)
Albumin: 5 g/dL (ref 3.5–5.0)
Alkaline Phosphatase: 42 U/L (ref 38–126)
Anion gap: 18 — ABNORMAL HIGH (ref 5–15)
BUN: 13 mg/dL (ref 6–20)
CO2: 14 mmol/L — ABNORMAL LOW (ref 22–32)
Calcium: 9.3 mg/dL (ref 8.9–10.3)
Chloride: 103 mmol/L (ref 98–111)
Creatinine, Ser: 0.74 mg/dL (ref 0.44–1.00)
GFR, Estimated: >60 mL/min (ref >60)
Glucose, Bld: 110 mg/dL — ABNORMAL HIGH (ref 70–99)
Potassium: 3.6 mmol/L (ref 3.5–5.1)
Sodium: 135 mmol/L (ref 135–145)
Total Bilirubin: 1 mg/dL (ref 0.3–1.2)
Total Protein: 8.8 g/dL — ABNORMAL HIGH (ref 6.5–8.1)

## 2023-01-06 LAB — URINALYSIS, ROUTINE W REFLEX MICROSCOPIC
Bacteria, UA: NONE SEEN
Bilirubin Urine: NEGATIVE
Glucose, UA: NEGATIVE mg/dL
Hgb urine dipstick: NEGATIVE
Ketones, ur: 80 mg/dL — AB
Nitrite: NEGATIVE
Protein, ur: 100 mg/dL — AB
Specific Gravity, Urine: 1.021 (ref 1.005–1.030)
pH: 6 (ref 5.0–8.0)

## 2023-01-06 LAB — BASIC METABOLIC PANEL
Anion gap: 7 (ref 5–15)
BUN: 12 mg/dL (ref 6–20)
CO2: 20 mmol/L — ABNORMAL LOW (ref 22–32)
Calcium: 8.1 mg/dL — ABNORMAL LOW (ref 8.9–10.3)
Chloride: 104 mmol/L (ref 98–111)
Creatinine, Ser: 0.66 mg/dL (ref 0.44–1.00)
GFR, Estimated: >60 mL/min (ref >60)
Glucose, Bld: 216 mg/dL — ABNORMAL HIGH (ref 70–99)
Potassium: 3.7 mmol/L (ref 3.5–5.1)
Sodium: 131 mmol/L — ABNORMAL LOW (ref 135–145)

## 2023-01-06 LAB — LACTIC ACID, PLASMA
Lactic Acid, Venous: 3.4 mmol/L (ref 0.5–1.9)
Lactic Acid, Venous: 1.7 mmol/L (ref 0.5–1.9)

## 2023-01-06 LAB — GASTROINTESTINAL PANEL BY PCR, STOOL (REPLACES STOOL CULTURE)

## 2023-01-06 LAB — CBC
HCT: 37.8 % (ref 36.0–46.0)
Hemoglobin: 12.5 g/dL (ref 12.0–15.0)
MCH: 26.7 pg (ref 26.0–34.0)
MCHC: 33.1 g/dL (ref 30.0–36.0)
MCV: 80.8 fL (ref 80.0–100.0)
Platelets: 219 10*3/uL (ref 150–400)
RBC: 4.68 MIL/uL (ref 3.87–5.11)
RDW: 15.1 % (ref 11.5–15.5)
WBC: 8.5 10*3/uL (ref 4.0–10.5)
nRBC: 0 % (ref 0.0–0.2)

## 2023-01-06 LAB — POC URINE PREG, ED: Preg Test, Ur: NEGATIVE

## 2023-01-06 LAB — LIPASE, BLOOD: Lipase: 28 U/L (ref 11–51)

## 2023-01-06 MED ORDER — AZITHROMYCIN 500 MG PO TABS: 500.0000 mg | ORAL_TABLET | Freq: Every day | ORAL | 0 refills | Status: AC

## 2023-01-06 MED ORDER — KETOROLAC TROMETHAMINE 30 MG/ML IJ SOLN
15.0000 mg | Freq: Once | INTRAMUSCULAR | Status: AC
  Administered 2023-01-06: 15 mg via INTRAVENOUS
  Filled 2023-01-06: qty 1

## 2023-01-06 MED ORDER — LACTATED RINGERS IV BOLUS
1000.0000 mL | Freq: Once | INTRAVENOUS | Status: AC
  Administered 2023-01-06: 1000 mL via INTRAVENOUS

## 2023-01-06 MED ORDER — DEXTROSE 5 % AND 0.9 % NACL IV BOLUS
1000.0000 mL | Freq: Once | INTRAVENOUS | Status: AC
  Administered 2023-01-06: 1000 mL via INTRAVENOUS

## 2023-01-06 MED ORDER — DROPERIDOL 2.5 MG/ML IJ SOLN
2.5000 mg | Freq: Once | INTRAMUSCULAR | Status: AC
  Administered 2023-01-06: 2.5 mg via INTRAVENOUS
  Filled 2023-01-06: qty 2

## 2023-01-06 MED ORDER — ONDANSETRON 4 MG PO TBDP: 4.0000 mg | ORAL_TABLET | Freq: Three times a day (TID) | ORAL | 0 refills | Status: DC | PRN

## 2023-01-06 MED ORDER — IOHEXOL 300 MG/ML  SOLN
100.0000 mL | Freq: Once | INTRAMUSCULAR | Status: AC | PRN
  Administered 2023-01-06: 100 mL via INTRAVENOUS

## 2023-01-06 MED ORDER — AZITHROMYCIN 500 MG PO TABS
500.0000 mg | ORAL_TABLET | Freq: Once | ORAL | Status: AC
  Administered 2023-01-06: 500 mg via ORAL
  Filled 2023-01-06: qty 1

## 2023-01-06 NOTE — ED Triage Notes (Signed)
Pt via EMS from home due to abd pain. Pt is shaking and moaning while in triage. Per EMS, pt has been having abd pain with vomiting and diarrhea since yesterday.

## 2023-01-06 NOTE — Discharge Instructions (Signed)
Please take the antibiotic once a day for the next 2 days.  You can take the Zofran as needed for nausea and vomiting.  Stick to clear liquids while you are feeling sick and then you can advance your diet to bland solids as tolerated.  Please make sure everyone around he is washing her hands as this is contagious.  If your abdominal pain is worsening or you are not able to keep fluids down and please return to the emergency department.

## 2023-01-06 NOTE — ED Notes (Signed)
Helped pt back to her room from the restroom. Bedside commode placed in pts room for pt to use due to pt's frequent diarrhea episodes.

## 2023-01-06 NOTE — ED Provider Notes (Signed)
[facility] Provider Note    Event Date/Time   First MD Initiated Contact with Patient 01/06/23 1906     (approximate)   History   Abdominal Pain   HPI  Veronica Mclean is a 27 y.o. female who presents with vomiting and diarrhea.  Symptoms started today.  Patient endorses lower abdominal pain multiple episodes of emesis and multiple episodes of nonbloody diarrhea.  Pain is constant.  Denies urinary symptoms denies fevers or chills.  Denies history of similar.  Did drink yesterday tells me she does not drink daily.     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 --      Excl. in GC? --     Most recent vital signs: Vitals:   01/06/23 2324 01/06/23 2326  BP: (!) 88/51 (!) 90/51  Pulse: 72   Resp: 15   Temp: 98.5 F (36.9 C)   SpO2: 98%      General: Awake, patient is writhing around on the stretcher looks uncomfortable looks dry CV:  Good peripheral perfusion.  Resp:  Normal effort.  Abd:  No distention.  Minimal suprapubic and lower quadrant tenderness but no  guarding abdomen soft Neuro:             Awake, Alert, Oriented x 3  Other:     ED Results / Procedures / Treatments  Labs (all labs ordered are listed, but only abnormal results are displayed) Labs Reviewed  GASTROINTESTINAL PANEL BY PCR, STOOL (REPLACES STOOL CULTURE) - Abnormal; Notable for the following components:      Result Value   Enteropathogenic E coli (EPEC) DETECTED (*)    All other components within normal limits  COMPREHENSIVE METABOLIC PANEL - Abnormal; Notable for the following components:   CO2 14 (*)    Glucose, Bld 110 (*)    Total Protein 8.8 (*)    Anion gap 18 (*)    All other components within normal limits  URINALYSIS, ROUTINE W REFLEX MICROSCOPIC - Abnormal; Notable for the following components:   Color, Urine YELLOW (*)    APPearance HAZY (*)    Ketones, ur 80 (*)    Protein, ur 100 (*)    Leukocytes,Ua TRACE (*)    All other components within normal  limits  LACTIC ACID, PLASMA - Abnormal; Notable for the following components:   Lactic Acid, Venous 3.4 (*)    All other components within normal limits  BASIC METABOLIC PANEL - Abnormal; Notable for the following components:   Sodium 131 (*)    CO2 20 (*)    Glucose, Bld 216 (*)    Calcium 8.1 (*)    All other components within normal limits  LIPASE, BLOOD  CBC  LACTIC ACID, PLASMA  POC URINE PREG, ED     EKG     RADIOLOGY    PROCEDURES:  Critical Care performed: No  Procedures    MEDICATIONS ORDERED IN ED: Medications  lactated ringers bolus 1,000 mL (0 mLs Intravenous Stopped 01/06/23 2141)  droperidol (INAPSINE) 2.5 MG/ML injection 2.5 mg (2.5 mg Intravenous Given 01/06/23 2032)  dextrose 5 % and 0.9% NaCl 5-0.9 % bolus 1,000 mL (0 mLs Intravenous Stopped 01/06/23 2247)  ketorolac (TORADOL) 30 MG/ML injection 15 mg (15 mg Intravenous Given 01/06/23 2100)  iohexol (OMNIPAQUE) 300 MG/ML solution 100 mL (100 mLs Intravenous Contrast Given 01/06/23 2113)  azithromycin (ZITHROMAX)  tablet 500 mg (500 mg Oral Given 01/06/23 2322)     IMPRESSION / MDM / ASSESSMENT AND PLAN / ED COURSE  I reviewed the triage vital signs and the nursing notes.                              Patient's presentation is most consistent with acute complicated illness / injury requiring diagnostic workup.  Differential diagnosis includes, but is not limited to, gastroenteritis, colitis, appendicitis, diverticulitis, cannabinoid hyperemesis syndrome, UTI  27 year old female presents with abdominal pain vomiting and diarrhea.  Symptoms started today.  On arrival patient is writhing around on the stretcher she has to go the bathroom multiple times to have diarrhea.  She is difficult to get a history from but it sounds like she has been having multiple episodes of vomiting diarrhea and feels like she needs to go to the bathroom every 5 minutes.  Pain is primarily in the lower abdomen.    Labs are notable for an anion gap of 18 with bicarb of 14.  She has 80 ketones in the urine so I do suspect that her anion gap is in the setting of ketoacidosis.  I have ordered a lactate.  Not diabetic glucose only 110 low suspicion for DKA or euglycemic DKA suspect that this is starvation.  We were able to send a stool sample off.  Will give bolus of fluid and droperidol to help with nausea and vomiting.  At this point do not think she needs abdominal imaging.  Patient has a lactate of 3.4 which I think is also contributing to her anion gap.  Given these abnormalities did obtain a CT abdomen pelvis which is negative.  Patient feeling improved on reassessment.  Tolerating p.o. no longer nauseous abdominal pain significantly improved.  Blood pressure soft but patient is asymptomatic.  GI panel is positive for enteropathogenic E. coli.  Given patient's severity of symptoms I do think she warrants treatment biotics.  Will give azithromycin daily 3 days.  Will prescribe Zofran.  We discussed return precautions.       FINAL CLINICAL IMPRESSION(S) / ED DIAGNOSES   Final diagnoses:  Intestinal infection due to enteropathogenic E. coli     Rx / DC Orders   ED Discharge Orders          Ordered  azithromycin (ZITHROMAX) 500 MG tablet  Daily        01/06/23 2334    ondansetron (ZOFRAN-ODT) 4 MG disintegrating tablet  Every 8 hours PRN        01/06/23 2334             Note:  This document was prepared using Dragon voice recognition software and may include unintentional dictation errors.   Georga Hacking, MD 01/06/23 [phone]

## 2023-01-06 NOTE — ED Notes (Addendum)
Critical gastric panel + enteropathogenic e coli. Sidney Ace made aware.

## 2023-01-06 NOTE — ED Notes (Signed)
Lactic acid 3.4, reported to Sidney Ace, MD.

## 2023-02-04 ENCOUNTER — Ambulatory Visit: Payer: MEDICAID | Admitting: *Deleted

## 2023-02-04 VITALS — BP 110/73 | HR 84 | Ht 67.0 in | Wt 137.0 lb

## 2023-02-04 DIAGNOSIS — Z3201 Encounter for pregnancy test, result positive: Secondary | ICD-10-CM

## 2023-02-04 LAB — POCT URINE PREGNANCY: Preg Test, Ur: POSITIVE — AB

## 2023-02-04 NOTE — Progress Notes (Signed)
Veronica Mclean presents today for UPT. She has no unusual complaints. LMP: 12/04/22 but had negative UPT in ED 01/05/23. No menses since.    OBJECTIVE: Appears well, in no apparent distress.  OB History     Gravida  3   Para  2   Term  0   Preterm  2   AB  0   Living  1      SAB  0   IAB  0   Ectopic  0   Multiple  0   Live Births  1          Home UPT Result: Positive In-Office UPT result: Postitive I have reviewed the patient's medical, obstetrical, social, and family histories, and medications.   ASSESSMENT: Positive pregnancy test  PLAN Prenatal care to be completed at: [facility]/ Intake scheduled at check out. SAB, ectopic and extreme N/V precautions and instructions on MAU location reviewed.

## 2023-02-13 ENCOUNTER — Ambulatory Visit: Payer: MEDICAID

## 2023-02-13 ENCOUNTER — Ambulatory Visit (INDEPENDENT_AMBULATORY_CARE_PROVIDER_SITE_OTHER): Payer: MEDICAID | Admitting: *Deleted

## 2023-02-13 ENCOUNTER — Other Ambulatory Visit (HOSPITAL_COMMUNITY)
Admission: RE | Admit: 2023-02-13 | Discharge: 2023-02-13 | Disposition: A | Discharge status: Home / Self Care | Payer: MEDICAID | Source: Ambulatory Visit | Attending: Obstetrics and Gynecology | Admitting: Obstetrics and Gynecology

## 2023-02-13 VITALS — BP 114/71 | HR 82 | Wt 140.3 lb

## 2023-02-13 DIAGNOSIS — O0991 Supervision of high risk pregnancy, unspecified, first trimester: Secondary | ICD-10-CM | POA: Diagnosis not present

## 2023-02-13 DIAGNOSIS — O0990 Supervision of high risk pregnancy, unspecified, unspecified trimester: Secondary | ICD-10-CM

## 2023-02-13 DIAGNOSIS — Z3A08 8 weeks gestation of pregnancy: Secondary | ICD-10-CM

## 2023-02-13 DIAGNOSIS — Z1339 Encounter for screening examination for other mental health and behavioral disorders: Secondary | ICD-10-CM

## 2023-02-13 DIAGNOSIS — O219 Vomiting of pregnancy, unspecified: Secondary | ICD-10-CM

## 2023-02-13 DIAGNOSIS — O3680X0 Pregnancy with inconclusive fetal viability, not applicable or unspecified: Secondary | ICD-10-CM

## 2023-02-13 DIAGNOSIS — Z3A09 9 weeks gestation of pregnancy: Secondary | ICD-10-CM

## 2023-02-13 MED ORDER — DOXYLAMINE-PYRIDOXINE 10-10 MG PO TBEC: 2.0000 | DELAYED_RELEASE_TABLET | Freq: Every day | ORAL | 5 refills | Status: DC

## 2023-02-13 MED ORDER — PROMETHAZINE HCL 25 MG PO TABS: 25.0000 mg | ORAL_TABLET | Freq: Four times a day (QID) | ORAL | 1 refills | Status: DC | PRN

## 2023-02-13 NOTE — Progress Notes (Signed)
New OB Intake  I connected with Arta Silence  on 02/13/23 at  3:10 PM EDT by In Person Visit and verified that I am speaking with the correct person using two identifiers. Nurse is located at [facility] and pt is located at [facility].  I discussed the limitations, risks, security and privacy concerns of performing an evaluation and management service by telephone and the availability of in person appointments. I also discussed with the patient that there may be a patient responsible charge related to this service. The patient expressed understanding and agreed to proceed.  I explained I am completing New OB Intake today. We discussed EDD of 09/10/2023, by Last Menstrual Period. Pt is G3P0201. I reviewed her allergies, medications and Medical/Surgical/OB history.    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 Discussed the nature of our practice with multiple providers including residents and students. Due to the size of the practice, the delivering provider may not be the same as those providing prenatal care.   Patient is not interested in water birth. Offered upcoming OB visit with CNM to discuss further.  MyChart/Babyscripts MyChart access verified. I explained pt will have some visits in office and some virtually. Babyscripts instructions given and order placed. Patient verifies receipt of registration text/e-mail. Account successfully created and app downloaded.  Blood Pressure Cuff/Weight Scale Patient has private insurance; instructed to purchase  blood pressure cuff and bring to first prenatal appt. Explained after first prenatal appt pt will check weekly and document in Babyscripts. Patient does not have weight scale; patient may purchase if they desire to track weight weekly in Babyscripts.  Anatomy US Explained first scheduled [place] will be around 19 weeks. Anatomy US scheduled for TBD at TBD.  Interested in [place]? If yes, send referral and doula dot phrase.    First visit review I reviewed new OB appt with patient. Explained pt will be seen by Albertine Grates, NP at first visit. Discussed Avelina Laine genetic screening with patient. Requests Panorama and Horizon.. Routine prenatal labs  OB Urine, GC/CC collected today. OB Panel, Natera needed at [facility].    Last Pap No results found for: "DIAGPAP"  Harrel Lemon, RN 02/13/2023  3:48 PM

## 2023-02-13 NOTE — Patient Instructions (Signed)

## 2023-03-20 ENCOUNTER — Ambulatory Visit (INDEPENDENT_AMBULATORY_CARE_PROVIDER_SITE_OTHER): Payer: MEDICAID | Admitting: Licensed Clinical Social Worker

## 2023-03-20 ENCOUNTER — Ambulatory Visit (INDEPENDENT_AMBULATORY_CARE_PROVIDER_SITE_OTHER): Payer: MEDICAID | Admitting: Obstetrics and Gynecology

## 2023-03-20 ENCOUNTER — Encounter: Payer: Self-pay | Admitting: Obstetrics and Gynecology

## 2023-03-20 ENCOUNTER — Other Ambulatory Visit (HOSPITAL_COMMUNITY)
Admission: RE | Admit: 2023-03-20 | Discharge: 2023-03-20 | Disposition: A | Discharge status: Home / Self Care | Payer: MEDICAID | Source: Ambulatory Visit | Attending: Obstetrics and Gynecology | Admitting: Obstetrics and Gynecology

## 2023-03-20 VITALS — BP 119/69 | HR 99 | Wt 143.2 lb

## 2023-03-20 DIAGNOSIS — Q513 Bicornate uterus: Secondary | ICD-10-CM

## 2023-03-20 DIAGNOSIS — Z8759 Personal history of other complications of pregnancy, childbirth and the puerperium: Secondary | ICD-10-CM

## 2023-03-20 DIAGNOSIS — Z3A13 13 weeks gestation of pregnancy: Secondary | ICD-10-CM

## 2023-03-20 DIAGNOSIS — Z3482 Encounter for supervision of other normal pregnancy, second trimester: Secondary | ICD-10-CM | POA: Diagnosis not present

## 2023-03-20 DIAGNOSIS — Z8751 Personal history of pre-term labor: Secondary | ICD-10-CM | POA: Diagnosis not present

## 2023-03-20 DIAGNOSIS — Z31430 Encounter of female for testing for genetic disease carrier status for procreative management: Secondary | ICD-10-CM

## 2023-03-20 DIAGNOSIS — O99340 Other mental disorders complicating pregnancy, unspecified trimester: Secondary | ICD-10-CM

## 2023-03-20 DIAGNOSIS — O0991 Supervision of high risk pregnancy, unspecified, first trimester: Secondary | ICD-10-CM | POA: Insufficient documentation

## 2023-03-20 DIAGNOSIS — F419 Anxiety disorder, unspecified: Secondary | ICD-10-CM

## 2023-03-20 DIAGNOSIS — O0990 Supervision of high risk pregnancy, unspecified, unspecified trimester: Secondary | ICD-10-CM | POA: Diagnosis not present

## 2023-03-20 MED ORDER — VITAFOL GUMMIES 3.33-0.333-34.8 MG PO CHEW
1.0000 | CHEWABLE_TABLET | Freq: Every day | ORAL | 5 refills | Status: DC
Start: 2023-03-20 — End: 2023-09-03

## 2023-03-20 NOTE — Progress Notes (Signed)
Pt presents for NOB visit. Pt c/o vaginal irritation and pelvic pain  Pt wants to to transfer care to [facility]

## 2023-03-20 NOTE — Progress Notes (Signed)
INITIAL PRENATAL VISIT  Subjective:   Veronica Mclean is being seen today for her first obstetrical visit.  She is at [redacted]w[redacted]d gestation by ultrasound. Her obstetrical history is significant for  hx of PTL and IUFD . Relationship with FOB:  no  . Patient does intend to breast feed. Pregnancy history fully reviewed.  Patient reports no complaints.  Indications for ASA therapy (per uptodate) One of the following: Previous pregnancy with preeclampsia, especially early onset and with an adverse outcome No Multifetal gestation No Chronic hypertension No Type 1 or 2 diabetes mellitus No Chronic kidney disease No Autoimmune disease (antiphospholipid syndrome, systemic lupus erythematosus) No  Two or more of the following: Nulliparity No Obesity (body mass index >30 kg/m2) No Family history of preeclampsia in mother or sister Yes Age ?35 years No Sociodemographic characteristics (African American race, low socioeconomic level) Yes Personal risk factors (eg, previous pregnancy with low birth weight or small for gestational age infant, previous adverse pregnancy outcome [eg, stillbirth], interval >10 years between pregnancies) No  Indications for early GDM screening  First-degree relative with diabetes No BMI >30kg/m2 No Age > 25 Yes Previous birth of an infant weighing ?4000 g No Gestational diabetes mellitus in a previous pregnancy No Glycated hemoglobin ?5.7 percent (39 mmol/mol), impaired glucose tolerance, or impaired fasting glucose on previous testing No High-risk race/ethnicity (eg, African American, Latino, Native American, Asian American, Pacific Islander) Yes Previous stillbirth of unknown cause Yes Maternal birthweight > 9 lbs No History of cardiovascular disease No Hypertension or on therapy for hypertension No High-density lipoprotein cholesterol level <35 mg/dL (0.98 mmol/L) and/or a triglyceride level >250 mg/dL (1.19 mmol/L) No Polycystic ovary syndrome  No Physical inactivity No Other clinical condition associated with insulin resistance (eg, severe obesity, acanthosis nigricans) No Current use of glucocorticoids No   Early screening tests: FBS, A1C, Random CBG, glucose challenge   Review of Systems:   Review of Systems  Objective:    Obstetric History OB History  Gravida Para Term Preterm AB Living  3 2 0 2 0 1  SAB IAB Ectopic Multiple Live Births  0 0 0 0 1    # Outcome Date GA Lbr Len/2nd Weight Sex Type Anes PTL Lv  3 Current           2 Preterm 07/28/21 [redacted]w[redacted]d 02:40 / 00:11 5 lb 6.8 oz (2.46 kg) M Vag-Spont EPI  LIV  1 Preterm 12/04/17 [redacted]w[redacted]d 01:25 1 lb 2.9 oz (0.536 kg) F Vag-Spont None  FD    Past Medical History:  Diagnosis Date   Chlamydia 09/2010, 01/2011, 12/2014   Gonorrhea 09/2010, 12/2014   HSV infection    Infection    UTI    Past Surgical History:  Procedure Laterality Date   NO PAST SURGERIES      Current Outpatient Medications on File Prior to Visit  Medication Sig Dispense Refill   prenatal vitamin w/FE, FA (NATACHEW) 29-1 MG CHEW chewable tablet Chew 1 tablet by mouth daily at 12 noon.     Doxylamine-Pyridoxine (DICLEGIS) 10-10 MG TBEC Take 2 tablets by mouth at bedtime. If symptoms persist, add one tablet in the morning and one in the afternoon (Patient not taking: Reported on 03/20/2023) 100 tablet 5   ibuprofen (ADVIL) 600 MG tablet Take 1 tablet (600 mg total) by mouth every 6 (six) hours as needed for cramping or moderate pain. (Patient not taking: Reported on 01/06/2023) 60 tablet 3   ondansetron (ZOFRAN-ODT) 4 MG disintegrating tablet Take  1 tablet (4 mg total) by mouth every 8 (eight) hours as needed. (Patient not taking: Reported on 03/20/2023) 20 tablet 0   pantoprazole (PROTONIX) 20 MG tablet Take 1 tablet (20 mg total) by mouth daily. (Patient not taking: Reported on 01/06/2023) 30 tablet 2   promethazine (PHENERGAN) 25 MG tablet Take 1 tablet (25 mg total) by mouth every 6 (six) hours as needed  for nausea or vomiting. (Patient not taking: Reported on 03/20/2023) 30 tablet 1   No current facility-administered medications on file prior to visit.    No Known Allergies  Social History:  reports that she quit smoking about 2 years ago. Her smoking use included cigars. She started smoking about 4 years ago. She has never used smokeless tobacco. She reports current alcohol use. She reports that she does not currently use drugs after having used the following drugs: Marijuana.  Family History  Problem Relation Age of Onset   Hypertension Mother    Kidney disease Father        kidney stones   Hypertension Maternal Grandfather    Diabetes Maternal Grandfather     The following portions of the patient's history were reviewed and updated as appropriate: allergies, current medications, past family history, past medical history, past social history, past surgical history and problem list.  Review of Systems Review of Systems  All other systems reviewed and are negative.     Physical Exam:  BP 119/69   Pulse 99   Wt 143 lb 3.2 oz (65 kg)   LMP 12/04/2022 (Approximate)   BMI 22.43 kg/m  CONSTITUTIONAL: Well-developed, well-nourished female in no acute distress.  HENT:  Normocephalic, atraumatic.  EYES: Conjunctivae normal.  NECK: Normal range of motion SKIN: Skin is warm and dry MUSCULOSKELETAL: Normal range of motion.   NEUROLOGIC: Alert and oriented  PSYCHIATRIC: Normal mood and affect. Normal behavior. Normal judgment and thought content. CARDIOVASCULAR: Normal heart rate  RESPIRATORY: Normal effort PELVIC:deferred  Fetal Heart Rate (bpm): 168   Movement: Present       Assessment:    Pregnancy: G3P0201  1. Supervision of high risk pregnancy, antepartum BP and FHR normal Discussed recommendation on ASA during pregnancy, discussed why we use it now vs waiting for bp to be elevated, all questions answered, pt is hesitant and declines at this time   -  CBC/D/Plt+RPR+Rh+ABO+RubIgG... - [facility] Basic Panel - prenatal vitamin w/FE, FA (NATACHEW) 29-1 MG CHEW chewable tablet; Chew 1 tablet by mouth daily at 12 noon. - Cervicovaginal ancillary only( ) - PANORAMA PRENATAL TEST - HgB A1c - Prenatal Vit-Fe Phos-FA-Omega (VITAFOL GUMMIES) 3.33-0.333-34.8 MG CHEW; Chew 1 tablet by mouth daily.  Dispense: 90 tablet; Refill: 5 - [place] MFM OB COMP + 14 WK; Future  2. History of preterm labor Second delivery 2023 35.5, ptl contractions and reports water broke, baby passed from SIDS at 1 year of life   3. History of IUFD IUFD 2019 first deliver 26.5, went for a scan and had no heart rate  4. [redacted] weeks gestation of pregnancy Initial labs drawn  Prenatal vitamins. Problem list reviewed and updated. Reviewed in detail the nature of the practice with collaborative care between  Genetic screening discussed: NIPS/First trimester screen/Quad/AFP ordered. Role of ultrasound in pregnancy discussed; Anatomy US: ordered.  5. Bicornate uterus Found in 2019 pregnancy   Follow up in 4 weeks. Discussed clinic routines, schedule of care and testing, genetic screening options, involvement of students and residents under the direct supervision of APPs  and doctors and presence of female providers. Pt verbalized understanding.  Future Appointments  Date Time Provider Department Center  04/21/2023  3:55 PM Adam Phenix, MD [facility]  04/25/2023 12:15 PM [facility] NURSE [facility]  04/25/2023 12:30 PM [facility] US4 [facility]    Sue Lush, FNP

## 2023-03-21 LAB — CBC/D/PLT+RPR+RH+ABO+RUBIGG...
Antibody Screen: NEGATIVE
Basophils Absolute: 0 10*3/uL (ref 0.0–0.2)
Basos: 0 %
EOS (ABSOLUTE): 0 10*3/uL (ref 0.0–0.4)
Eos: 1 %
HCV Ab: NONREACTIVE
HIV Screen 4th Generation wRfx: NONREACTIVE
Hematocrit: 38 % (ref 34.0–46.6)
Hemoglobin: 12.5 g/dL (ref 11.1–15.9)
Hepatitis B Surface Ag: NEGATIVE
Immature Grans (Abs): 0 10*3/uL (ref 0.0–0.1)
Immature Granulocytes: 0 %
Lymphocytes Absolute: 1.1 10*3/uL (ref 0.7–3.1)
Lymphs: 22 %
MCH: 28.2 pg (ref 26.6–33.0)
MCHC: 32.9 g/dL (ref 31.5–35.7)
MCV: 86 fL (ref 79–97)
Monocytes Absolute: 0.4 10*3/uL (ref 0.1–0.9)
Monocytes: 9 %
Neutrophils Absolute: 3.2 10*3/uL (ref 1.4–7.0)
Neutrophils: 68 %
Platelets: 194 10*3/uL (ref 150–450)
RBC: 4.43 x10E6/uL (ref 3.77–5.28)
RDW: 13.8 % (ref 11.7–15.4)
RPR Ser Ql: NONREACTIVE
Rh Factor: POSITIVE
Rubella Antibodies, IGG: 9.61 {index} (ref >0.99)
WBC: 4.7 10*3/uL (ref 3.4–10.8)

## 2023-03-21 LAB — HCV INTERPRETATION

## 2023-03-21 LAB — HEMOGLOBIN A1C
Est. average glucose Bld gHb Est-mCnc: 105 mg/dL
Hgb A1c MFr Bld: 5.3 % (ref 4.8–5.6)

## 2023-03-23 ENCOUNTER — Encounter: Payer: Self-pay | Admitting: Obstetrics & Gynecology

## 2023-03-23 DIAGNOSIS — B9689 Other specified bacterial agents as the cause of diseases classified elsewhere: Secondary | ICD-10-CM

## 2023-03-23 DIAGNOSIS — B379 Candidiasis, unspecified: Secondary | ICD-10-CM

## 2023-03-24 ENCOUNTER — Other Ambulatory Visit: Payer: Self-pay | Admitting: Obstetrics and Gynecology

## 2023-03-24 DIAGNOSIS — B379 Candidiasis, unspecified: Secondary | ICD-10-CM

## 2023-03-24 DIAGNOSIS — N760 Acute vaginitis: Secondary | ICD-10-CM

## 2023-03-24 MED ORDER — METRONIDAZOLE 500 MG PO TABS
500.0000 mg | ORAL_TABLET | Freq: Two times a day (BID) | ORAL | 0 refills | Status: DC
Start: 2023-03-24 — End: 2023-03-30

## 2023-03-24 MED ORDER — FLUCONAZOLE 150 MG PO TABS
150.0000 mg | ORAL_TABLET | Freq: Once | ORAL | 0 refills | Status: AC
Start: 2023-03-24 — End: 2023-03-24

## 2023-03-24 NOTE — Progress Notes (Signed)
Orders only

## 2023-03-25 NOTE — BH Specialist Note (Signed)
Integrated Behavioral Health Initial In-Person Visit  MRN: [id number] Name: Veronica Mclean  Number of Integrated Behavioral Health Clinician visits:  Session Start time:   2:36pm Session End time: 3:10p Total time in minutes: 33 mins in person at [facility]   Types of Service: Individual psychotherapy  Interpretor:No. Interpretor Name and Language: none   Warm Hand Off Completed.        Subjective: Veronica Mclean is a 26 y.o. female accompanied by n/a Patient was referred by Theora Gianotti NP for hx of iufd and son passed from SIDS. Patient reports the following symptoms/concerns: anxious mood  Duration of problem: over one year; Severity of problem: mild  Objective: Mood: Anxious and Affect: Appropriate Risk of harm to self or others: No plan to harm self or others  Life Context: Family and Social: Lives in [place] School/Work: n/a Self-Care: n/a Life Changes: new pregnancy  Patient and/or Family's Strengths/Protective Factors: Concrete supports in place (healthy food, safe environments, etc.)  Goals Addressed: Patient will: Reduce symptoms of: anxiety Increase knowledge and/or ability of: coping skills  Demonstrate ability to: Increase healthy adjustment to current life circumstances  Progress towards Goals: Ongoing  Interventions: Interventions utilized: Supportive Counseling  Standardized Assessments completed: PHQ 9  Patient and/or Family Response: Veronica Mclean reports feeling worried and anxious with current pregnancy due to history of IUFD. Veronica Mclean reports family is supportive.    Assessment: Patient currently experiencing anxiety affecting pregnancy.   Patient may benefit from integrated behavioral health.  Plan: Follow up with behavioral health clinician on : next ob visit  Behavioral recommendations: prioritize rest, communicate need for support when needed, memorialize son death when ready  Referral(s): Integrated [employer] (In Clinic) "From scale of 1-10, how likely are you to follow plan?":    Gwyndolyn Saxon, LCSW

## 2023-03-27 LAB — CERVICOVAGINAL ANCILLARY ONLY
Bacterial Vaginitis (gardnerella): POSITIVE — AB
Candida Glabrata: NEGATIVE
Candida Vaginitis: POSITIVE — AB
Chlamydia: NEGATIVE
Comment: NEGATIVE
Comment: NEGATIVE
Comment: NEGATIVE
Comment: NEGATIVE
Comment: NEGATIVE
Comment: NORMAL
Neisseria Gonorrhea: NEGATIVE
Trichomonas: NEGATIVE

## 2023-03-27 MED ORDER — CLINDAMYCIN PHOSPHATE 2 % VA CREA: 1.0000 | TOPICAL_CREAM | Freq: Every day | VAGINAL | 0 refills | Status: DC

## 2023-03-28 LAB — PANORAMA PRENATAL TEST FULL PANEL:PANORAMA TEST PLUS 5 ADDITIONAL MICRODELETIONS: FETAL FRACTION: 14.7

## 2023-03-30 MED ORDER — CLINDAMYCIN HCL 300 MG PO CAPS: 300.0000 mg | ORAL_CAPSULE | Freq: Two times a day (BID) | ORAL | 0 refills | Status: AC

## 2023-03-30 NOTE — Addendum Note (Signed)
Addended by: Sue Lush on: 03/30/2023 05:10 PM   Modules accepted: Orders

## 2023-03-31 LAB — HORIZON CUSTOM: REPORT SUMMARY: POSITIVE — AB

## 2023-04-03 ENCOUNTER — Telehealth: Payer: Self-pay

## 2023-04-03 NOTE — Telephone Encounter (Signed)
S/w pt and advised of genetic results, pt declined genetic counseling

## 2023-04-18 DIAGNOSIS — O09899 Supervision of other high risk pregnancies, unspecified trimester: Secondary | ICD-10-CM | POA: Insufficient documentation

## 2023-04-18 DIAGNOSIS — D563 Thalassemia minor: Secondary | ICD-10-CM | POA: Insufficient documentation

## 2023-04-21 ENCOUNTER — Ambulatory Visit (INDEPENDENT_AMBULATORY_CARE_PROVIDER_SITE_OTHER): Payer: MEDICAID | Admitting: Obstetrics & Gynecology

## 2023-04-21 ENCOUNTER — Encounter: Payer: Self-pay | Admitting: Obstetrics & Gynecology

## 2023-04-21 ENCOUNTER — Other Ambulatory Visit (HOSPITAL_COMMUNITY)
Admission: RE | Admit: 2023-04-21 | Discharge: 2023-04-21 | Disposition: A | Discharge status: Home / Self Care | Payer: MEDICAID | Source: Ambulatory Visit | Attending: Obstetrics & Gynecology | Admitting: Obstetrics & Gynecology

## 2023-04-21 VITALS — BP 108/68 | HR 97 | Wt 152.0 lb

## 2023-04-21 DIAGNOSIS — B9689 Other specified bacterial agents as the cause of diseases classified elsewhere: Secondary | ICD-10-CM | POA: Insufficient documentation

## 2023-04-21 DIAGNOSIS — Q513 Bicornate uterus: Secondary | ICD-10-CM

## 2023-04-21 DIAGNOSIS — Z8759 Personal history of other complications of pregnancy, childbirth and the puerperium: Secondary | ICD-10-CM

## 2023-04-21 DIAGNOSIS — B3731 Acute candidiasis of vulva and vagina: Secondary | ICD-10-CM | POA: Insufficient documentation

## 2023-04-21 DIAGNOSIS — O0990 Supervision of high risk pregnancy, unspecified, unspecified trimester: Secondary | ICD-10-CM

## 2023-04-21 DIAGNOSIS — O09899 Supervision of other high risk pregnancies, unspecified trimester: Secondary | ICD-10-CM

## 2023-04-21 DIAGNOSIS — O98812 Other maternal infectious and parasitic diseases complicating pregnancy, second trimester: Secondary | ICD-10-CM | POA: Insufficient documentation

## 2023-04-21 DIAGNOSIS — O0992 Supervision of high risk pregnancy, unspecified, second trimester: Secondary | ICD-10-CM | POA: Insufficient documentation

## 2023-04-21 DIAGNOSIS — D563 Thalassemia minor: Secondary | ICD-10-CM

## 2023-04-21 DIAGNOSIS — Z3A18 18 weeks gestation of pregnancy: Secondary | ICD-10-CM

## 2023-04-21 DIAGNOSIS — O09892 Supervision of other high risk pregnancies, second trimester: Secondary | ICD-10-CM

## 2023-04-21 DIAGNOSIS — Z113 Encounter for screening for infections with a predominantly sexual mode of transmission: Secondary | ICD-10-CM | POA: Insufficient documentation

## 2023-04-21 DIAGNOSIS — O23592 Infection of other part of genital tract in pregnancy, second trimester: Secondary | ICD-10-CM | POA: Insufficient documentation

## 2023-04-21 NOTE — Progress Notes (Signed)
   PRENATAL VISIT NOTE  Subjective:  Veronica Mclean is a 27 y.o. G3P0201 at [redacted]w[redacted]d being seen today for ongoing prenatal care.  She is currently monitored for the following issues for this high-risk pregnancy and has HSV-1 (herpes simplex virus 1) infection; IUFD at 20 weeks or more of gestation; Bicornate uterus; Preterm premature rupture of membranes (PPROM) delivered, current hospitalization; Supervision of high risk pregnancy, antepartum; History of preterm labor; History of IUFD; History of preterm delivery, currently pregnant; and Alpha thalassemia silent carrier on their problem list.  Patient reports no complaints.  Contractions: Not present.  .  Movement: Present. Denies leaking of fluid.   The following portions of the patient's history were reviewed and updated as appropriate: allergies, current medications, past family history, past medical history, past social history, past surgical history and problem list.   Objective:   Vitals:   04/21/23 1600  BP: 108/68  Pulse: 97  Weight: 152 lb (68.9 kg)    Fetal Status: Fetal Heart Rate (bpm): 164   Movement: Present     General:  Alert, oriented and cooperative. Patient is in no acute distress.  Skin: Skin is warm and dry. No rash noted.   Cardiovascular: Normal heart rate noted  Respiratory: Normal respiratory effort, no problems with respiration noted  Abdomen: Soft, gravid, appropriate for gestational age.  Pain/Pressure: Absent     Pelvic: Cervical exam deferred        Extremities: Normal range of motion.  Edema: None  Mental Status: Normal mood and affect. Normal behavior. Normal judgment and thought content.   Assessment and Plan:  Pregnancy: G3P0201 at [redacted]w[redacted]d 1. Supervision of high risk pregnancy, antepartum [redacted]w[redacted]d  - Cervicovaginal ancillary only - AFP, Serum, Open Spina Bifida  2. Bicornate uterus H/o PTB  3. History of IUFD 26 weeks  4. History of preterm delivery, currently pregnant   5. Alpha  thalassemia silent carrier   Preterm labor symptoms and general obstetric precautions including but not limited to vaginal bleeding, contractions, leaking of fluid and fetal movement were reviewed in detail with the patient. Please refer to After Visit Summary for other counseling recommendations.   Return in about 4 weeks (around 05/19/2023).  Future Appointments  Date Time Provider Department Center  04/25/2023 12:15 PM [facility] NURSE [facility]  04/25/2023 12:30 PM [facility] US4 [facility]  05/19/2023  1:15 PM Adam Phenix, MD [facility]    Scheryl Darter, MD

## 2023-04-22 LAB — CERVICOVAGINAL ANCILLARY ONLY
Bacterial Vaginitis (gardnerella): POSITIVE — AB
Candida Glabrata: NEGATIVE
Candida Vaginitis: POSITIVE — AB
Comment: NEGATIVE
Comment: NEGATIVE
Comment: NEGATIVE

## 2023-04-23 ENCOUNTER — Telehealth: Payer: Self-pay | Admitting: General Practice

## 2023-04-23 LAB — AFP, SERUM, OPEN SPINA BIFIDA
AFP MoM: 1.83
AFP Value: 85.8 ng/mL
Gest. Age on Collection Date: 18 wk
Maternal Age At EDD: 29.1 a
OSBR Risk 1 IN: 2380
Test Results:: NEGATIVE
Weight: 152 [lb_av]

## 2023-04-23 MED ORDER — METRONIDAZOLE 0.75 % VA GEL: 1.0000 | Freq: Every day | VAGINAL | 0 refills | Status: AC

## 2023-04-23 MED ORDER — FLUCONAZOLE 150 MG PO TABS
150.0000 mg | ORAL_TABLET | Freq: Once | ORAL | 0 refills | Status: AC
Start: 2023-04-23 — End: 2023-04-23

## 2023-04-23 NOTE — Telephone Encounter (Signed)
Called patient at her request to review medication options for BV & yeast. Discussed with patient we can refill the clindamycin, but it isn't the preferred treatment and I would be hesitant to try it again since it didn't work the first time. Discussed we could try the oral metronidazole or the vaginal gel but both are considered low risk in pregnancy and frequently prescribed by [place]. Patient states she would like to try the metrogel. Discussed options for yeast treatment could be fluconazole or she could do over the counter monistat 7. Patient states she would like to try the fluconazole. Both prescriptions sent for patient. Patient verbalized understanding.

## 2023-04-25 ENCOUNTER — Ambulatory Visit: Payer: MEDICAID | Admitting: *Deleted

## 2023-04-25 ENCOUNTER — Ambulatory Visit: Payer: MEDICAID | Attending: Obstetrics and Gynecology

## 2023-04-25 ENCOUNTER — Other Ambulatory Visit: Payer: Self-pay | Admitting: *Deleted

## 2023-04-25 VITALS — BP 102/52 | HR 85

## 2023-04-25 DIAGNOSIS — Z3A19 19 weeks gestation of pregnancy: Secondary | ICD-10-CM

## 2023-04-25 DIAGNOSIS — O09212 Supervision of pregnancy with history of pre-term labor, second trimester: Secondary | ICD-10-CM | POA: Diagnosis not present

## 2023-04-25 DIAGNOSIS — O09899 Supervision of other high risk pregnancies, unspecified trimester: Secondary | ICD-10-CM | POA: Insufficient documentation

## 2023-04-25 DIAGNOSIS — D563 Thalassemia minor: Secondary | ICD-10-CM | POA: Diagnosis not present

## 2023-04-25 DIAGNOSIS — O0990 Supervision of high risk pregnancy, unspecified, unspecified trimester: Secondary | ICD-10-CM | POA: Insufficient documentation

## 2023-04-25 DIAGNOSIS — D582 Other hemoglobinopathies: Secondary | ICD-10-CM | POA: Diagnosis not present

## 2023-04-25 DIAGNOSIS — Q513 Bicornate uterus: Secondary | ICD-10-CM

## 2023-04-25 DIAGNOSIS — O09292 Supervision of pregnancy with other poor reproductive or obstetric history, second trimester: Secondary | ICD-10-CM

## 2023-04-25 DIAGNOSIS — O285 Abnormal chromosomal and genetic finding on antenatal screening of mother: Secondary | ICD-10-CM

## 2023-04-25 DIAGNOSIS — O34592 Maternal care for other abnormalities of gravid uterus, second trimester: Secondary | ICD-10-CM

## 2023-04-25 DIAGNOSIS — Z3689 Encounter for other specified antenatal screening: Secondary | ICD-10-CM

## 2023-05-01 MED ORDER — FLUCONAZOLE 150 MG PO TABS
150.0000 mg | ORAL_TABLET | Freq: Once | ORAL | 0 refills | Status: AC
Start: 2023-05-01 — End: 2023-05-01

## 2023-05-03 IMAGING — US US MFM OB COMP +14 WKS
1 series · 16 of 28 positions shown · non-contrast
Comparison: none

[facility]

                                                            Obstetrics &
                                                            Gynecology
                                                            [address]
                                                            [place].
 1  US MFM OB COMP + 14 WK                [address].01    [facility]
Indications
 17 weeks gestation of pregnancy
 Antenatal screening for malformations
 Uterine abnormality during pregnancy
 (bicornuate)
 Poor obstetric history: Previous IUFD (26
 weeks)
Fetal Evaluation
 Num Of Fetuses:         1
 Fetal Heart Rate(bpm):  166
 Cardiac Activity:       Observed
 Presentation:           Breech
 Placenta:               Anterior
 P. Cord Insertion:      Not well visualized
 Amniotic Fluid
 AFI FV:      Within normal limits
                             Largest Pocket(cm)
Biometry
 BPD:      36.6  mm     G. Age:  17w 1d         52  %    CI:         74.3   %    70 - 86
                                                         FL/HC:      17.9   %    14.6 -
 HC:      134.8  mm     G. Age:  17w 0d         31  %    HC/AC:      1.14        1.07 -
 AC:      118.4  mm     G. Age:  17w 4d         64  %    FL/BPD:     65.8   %
 FL:       24.1  mm     G. Age:  17w 2d         50  %    FL/AC:      20.4   %    20 - 24
 HUM:      22.7  mm     G. Age:  17w 0d         53  %
 CER:      15.9  mm     G. Age:  16w 3d         13  %
 LV:        6.4  mm
 Est. FW:     192  gm      0 lb 7 oz     59  %
OB History
 Gravidity:    2         Term:   0        Prem:   1        SAB:   0
 TOP:          0       Ectopic:  0        Living: 0
Gestational Age
 LMP:           18w 2d        Date:  11/12/20                 EDD:   08/19/21
 U/S Today:     17w 2d                                        EDD:   08/26/21
 Best:          17w 1d     Det. By:  Early Ultrasound         EDD:   08/27/21
                                     (01/15/21)
Anatomy
 Cranium:               Appears normal         LVOT:                   Not well visualized
 Cavum:                 Not well visualized    Aortic Arch:            Appears normal
 Ventricles:            Appears normal         Ductal Arch:            Appears normal
 Choroid Plexus:        Appears normal         Diaphragm:              Not well visualized
 Cerebellum:            Appears normal         Stomach:                Appears normal, left
                                                                       sided
 Posterior Fossa:       Not well visualized    Abdomen:                Appears normal
 Nuchal Fold:           Not well visualized    Abdominal Wall:         Not well visualized
 Face:                  Not well visualized    Cord Vessels:           Appears normal (3
                                                                       vessel cord)
 Lips:                  Not well visualized    Kidneys:                Appear normal
 Palate:                Not well visualized    Bladder:                Appears normal
 Thoracic:              Not well visualized    Spine:                  Not well visualized
 Heart:                 Not well visualized    Upper Extremities:      Visualized
 RVOT:                  Not well visualized    Lower Extremities:      Visualized
 Other:  Technically difficult due to early gestational age and fetal position.
Cervix Uterus Adnexa
 Cervix
 Length:           3.44  cm.
 Normal appearance by transabdominal scan.
 Uterus
 Bicornuate.
 Right Ovary
 Within normal limits.
 Left Ovary
 Cul De Sac
 No free fluid seen.
 Adnexa
 No abnormality visualized.
Comments
 Ms. Thina is a G2P0 who is here at 17 w 1 d in consultation
 for a bicornuate uterus and a history of anyhydramnios
 resulting in an IUFD at 26 weeks. She is seen a the request
 of [facility].
 Ms. Thina notes that her pregnancy is overall going well.
 She denies s/sx of preterm labor. Her prenatal labs are
 normal. She had a low risk NIPS.
 For further details of her history please see my [REDACTED]
 consultation.
 Regarding her pregnancy issues:
 1) History of bicornuate uterus
 She has known of her bicornuate uterus since 1611. The
 uterus at that time did not appear heart shaped however, 3D
 imaging demonstrates septum to the internal os. There is one
 cervix but two cavities. The pregnancy is located on the left
 side cavity.
 2) IUFD at 26 weeks.
 Ms. Thina, notes that she did not recall having loss of fluid
 or bleeding at the time. She did note that she had significant
 nausea and vomiting that subsided after delivery. Pathology
 report was not seen in [REDACTED].
  Her records report that Ms. Thina had a 102.6 fever and
 was treated with Unasyn. Thuse this is possible Preterm
 premature rupture with subsequent chorioamnionitis.
 Today' exam revealed a single intrauterine pregnancy here
 for a detailed anatomy bicornuate uterus.
 Normal anatomy with measurements consistent with dates
 There is good fetal movement and amniotic fluid volume
 Suboptimal views of the fetal anatomy were obtained
 secondary to fetal position and early gestation.

[Series 1: us mfm ob comp +14 wks · 16 of 82 slices shown]
[im 1/82]
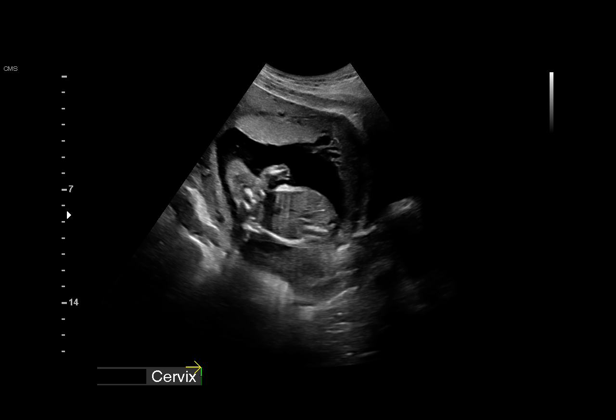
[im 7/82]
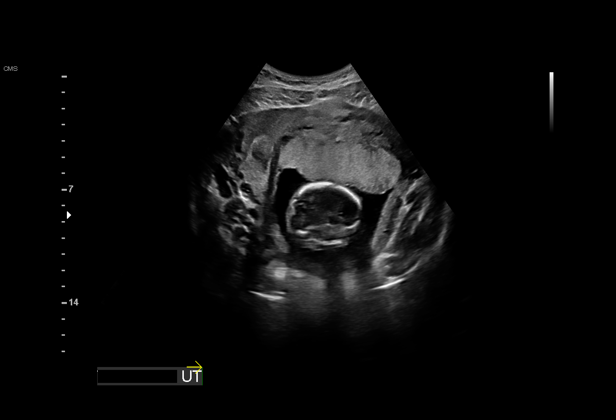
[im 13/82]
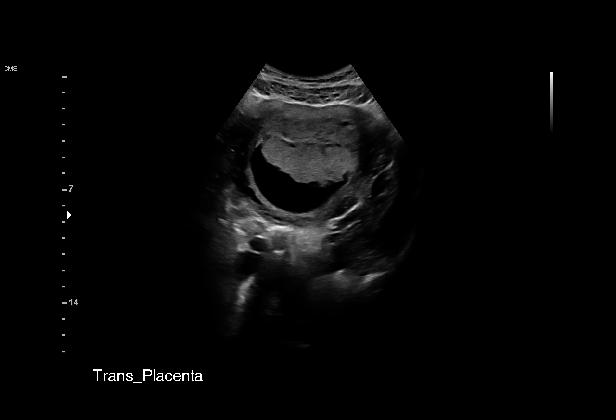
[im 19/82]
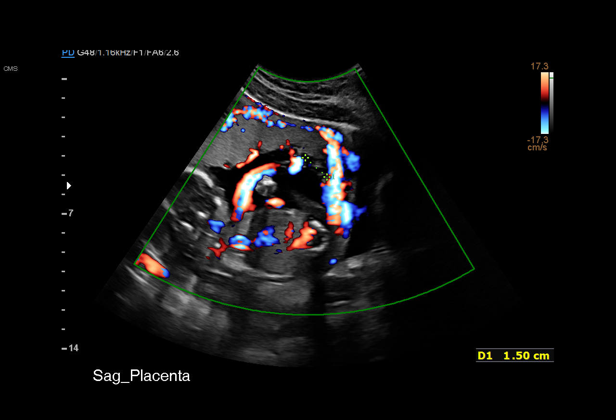
[im 22/82]
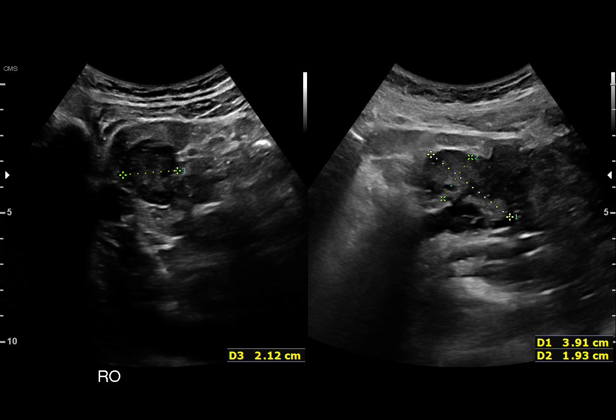
[im 28/82]
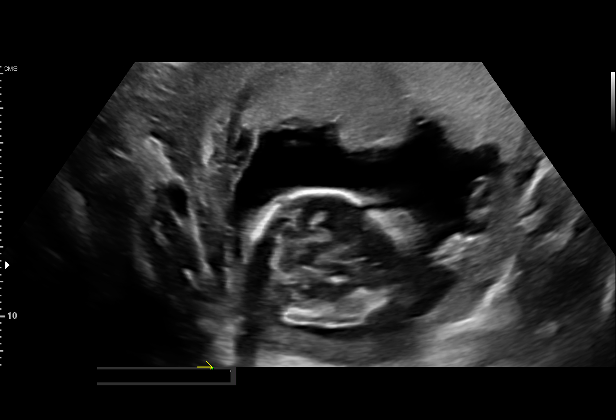
[im 34/82]
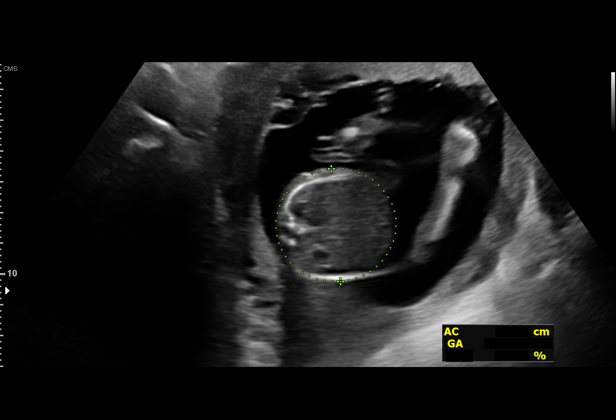
[im 40/82]
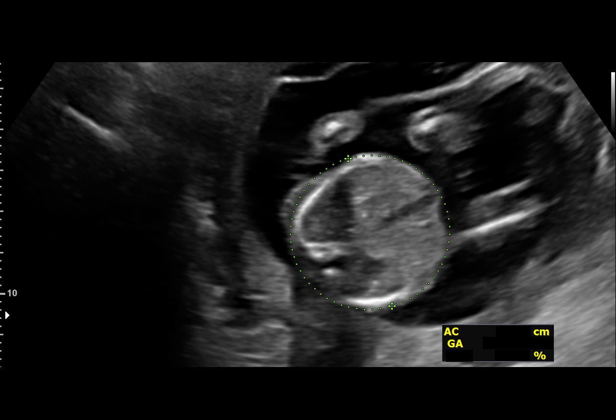
[im 43/82]
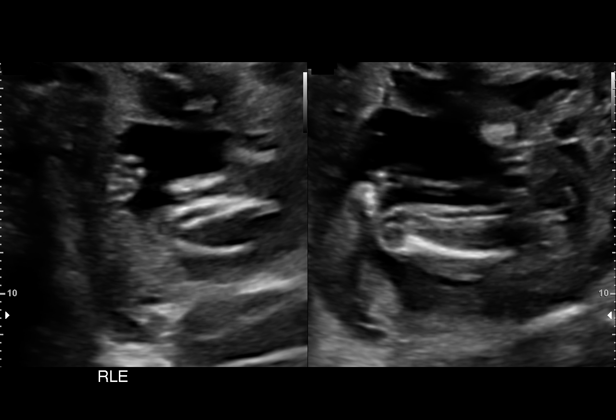
[im 49/82]
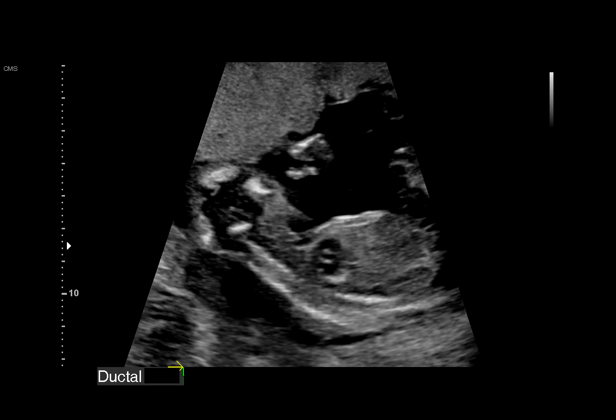
[im 55/82]
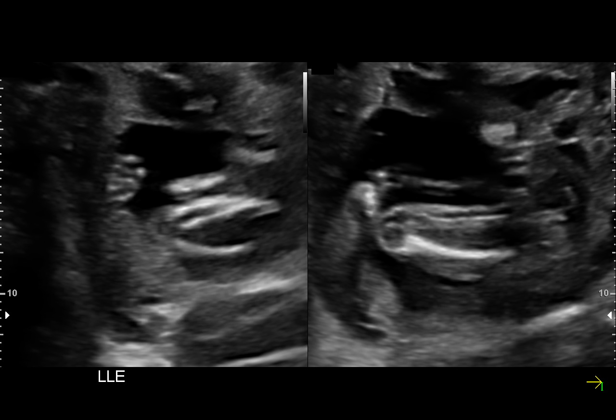
[im 61/82]
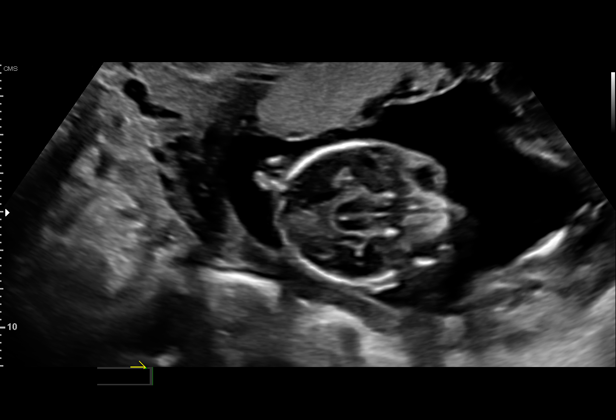
[im 64/82]
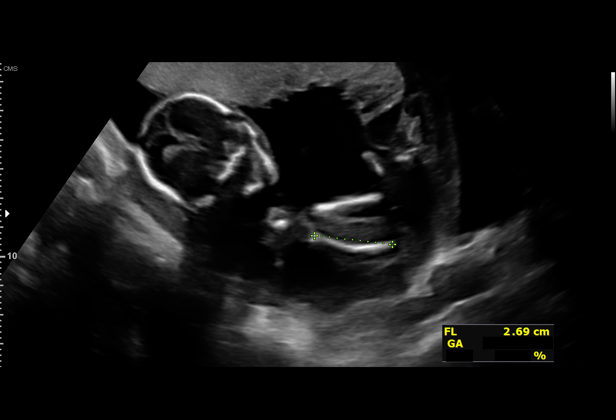
[im 70/82]
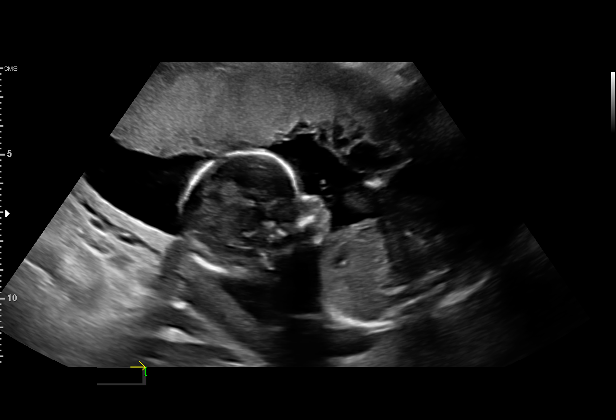
[im 76/82]
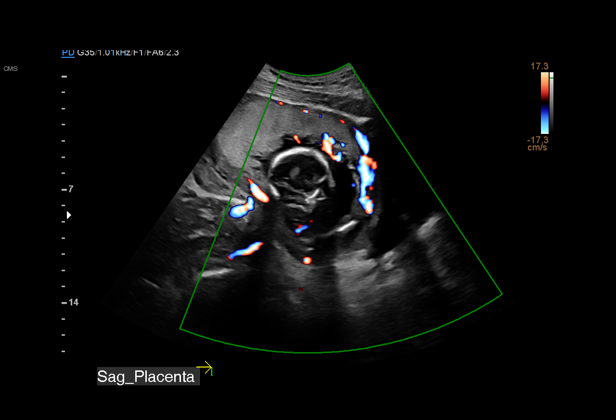
[im 82/82]
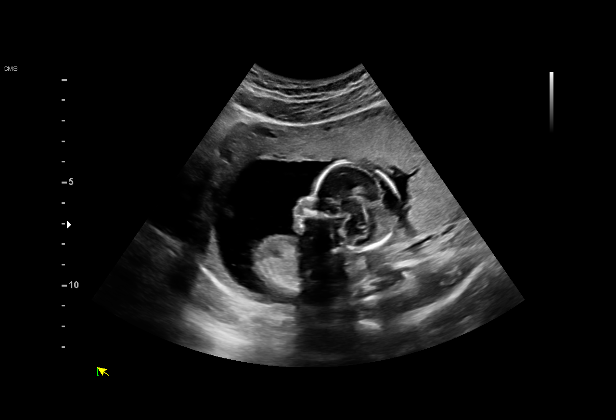

[16 of 28 positions shown; findings below may reference images not displayed]

IMPRESSION: 1) Bicornuate uterus
 A bicornuate uterus represents partial lack of fusion of the
 two mullerian ducts thereby resulting in a single cervix with a
 varying degree of separation in the two uterine horns. This
 anomaly is relatively common, and pregnancy outcome has
 usually been reported to be near normal. Some, however,
 find a high rate of early miscarriage, preterm labor and
 breech presentation.

 Renal anomalies are found in 20 to 30 percent of women with
 m?llerian defects. Therefore, all women with m?llerian
 defects should undergo a radiologic renal investigation, such
 as an intravenous pyelogram or renal ultrasound. This can be
 done in the post-partum period.

 2) IUFD at 26 weeks.
 I discussed with Ms. Thina that the cause for the IUFD is
 still uncertain however, there is a suspecion for preterm
 premature rupture of membranes given her fever at the time
 of delivery. In addition, I discussed that this is likely a non
 recurrent cause if PPROM is the etiology. In addition,
 bicornuate uterus is associated with early miscarriage, SELLO R
 and malpresentation, but likely not IUFD.

 3) Marginal cord insertion:
 We observed a marginal cord insertion. I explained that this
 is defined as a placental cord insertion that is < 2 cm from the
 placental edge. In these cases there is an increased
 associated with small for gestational age. Therefore serial
 growth exams are recommended to trend growth.

 Recommendations:
 Follow up growth in 4 weeks with cervical length
 measurements.
 Ms. Thina was counseled regarding s/sx of preterm labor.
 I spent 45 minutes with > 50% in face to face consultation.

## 2023-05-19 ENCOUNTER — Ambulatory Visit (INDEPENDENT_AMBULATORY_CARE_PROVIDER_SITE_OTHER): Payer: MEDICAID | Admitting: Obstetrics & Gynecology

## 2023-05-19 ENCOUNTER — Other Ambulatory Visit (HOSPITAL_COMMUNITY)
Admission: RE | Admit: 2023-05-19 | Discharge: 2023-05-19 | Disposition: A | Discharge status: Home / Self Care | Payer: MEDICAID | Source: Ambulatory Visit | Attending: Obstetrics & Gynecology | Admitting: Obstetrics & Gynecology

## 2023-05-19 VITALS — BP 111/67 | HR 91 | Wt 165.4 lb

## 2023-05-19 DIAGNOSIS — B9689 Other specified bacterial agents as the cause of diseases classified elsewhere: Secondary | ICD-10-CM | POA: Diagnosis present

## 2023-05-19 DIAGNOSIS — O0992 Supervision of high risk pregnancy, unspecified, second trimester: Secondary | ICD-10-CM

## 2023-05-19 DIAGNOSIS — Q513 Bicornate uterus: Secondary | ICD-10-CM

## 2023-05-19 DIAGNOSIS — Z8759 Personal history of other complications of pregnancy, childbirth and the puerperium: Secondary | ICD-10-CM

## 2023-05-19 DIAGNOSIS — N760 Acute vaginitis: Secondary | ICD-10-CM | POA: Diagnosis present

## 2023-05-19 DIAGNOSIS — Z3A22 22 weeks gestation of pregnancy: Secondary | ICD-10-CM

## 2023-05-19 DIAGNOSIS — O0990 Supervision of high risk pregnancy, unspecified, unspecified trimester: Secondary | ICD-10-CM

## 2023-05-19 NOTE — Progress Notes (Signed)
   PRENATAL VISIT NOTE  Subjective:  Veronica Mclean is a 27 y.o. G3P0201 at [redacted]w[redacted]d being seen today for ongoing prenatal care.  She is currently monitored for the following issues for this high-risk pregnancy and has HSV-1 (herpes simplex virus 1) infection; IUFD at 20 weeks or more of gestation; Bicornate uterus; Preterm premature rupture of membranes (PPROM) delivered, current hospitalization; Supervision of high risk pregnancy, antepartum; History of preterm labor; History of IUFD; History of preterm delivery, currently pregnant; and Alpha thalassemia silent carrier on their problem list.  Patient reports no complaints.  Contractions: Not present. Vag. Bleeding: None.  Movement: Present. Denies leaking of fluid.   The following portions of the patient's history were reviewed and updated as appropriate: allergies, current medications, past family history, past medical history, past social history, past surgical history and problem list.   Objective:   Vitals:   05/19/23 1323  BP: 111/67  Pulse: 91  Weight: 165 lb 6.4 oz (75 kg)    Fetal Status: Fetal Heart Rate (bpm): 151   Movement: Present     General:  Alert, oriented and cooperative. Patient is in no acute distress.  Skin: Skin is warm and dry. No rash noted.   Cardiovascular: Normal heart rate noted  Respiratory: Normal respiratory effort, no problems with respiration noted  Abdomen: Soft, gravid, appropriate for gestational age.  Pain/Pressure: Absent     Pelvic: Cervical exam deferred        Extremities: Normal range of motion.  Edema: None  Mental Status: Normal mood and affect. Normal behavior. Normal judgment and thought content.   Assessment and Plan:  Pregnancy: G3P0201 at [redacted]w[redacted]d 1. Bacterial vaginosis Wants to have TOC  2. Bicornate uterus   3. History of IUFD At 26 weeks  4. Supervision of high risk pregnancy, antepartum F/u US this week  Preterm labor symptoms and general obstetric precautions  including but not limited to vaginal bleeding, contractions, leaking of fluid and fetal movement were reviewed in detail with the patient. Please refer to After Visit Summary for other counseling recommendations.   Return in about 4 weeks (around 06/16/2023).  Future Appointments  Date Time Provider Department Center  05/23/2023  1:30 PM [facility] US5 [facility]    Scheryl Darter, MD

## 2023-05-21 LAB — CERVICOVAGINAL ANCILLARY ONLY
Bacterial Vaginitis (gardnerella): NEGATIVE
Candida Glabrata: NEGATIVE
Candida Vaginitis: NEGATIVE
Chlamydia: NEGATIVE
Comment: NEGATIVE
Comment: NEGATIVE
Comment: NEGATIVE
Comment: NEGATIVE
Comment: NEGATIVE
Comment: NORMAL
Neisseria Gonorrhea: NEGATIVE
Trichomonas: NEGATIVE

## 2023-05-23 ENCOUNTER — Ambulatory Visit: Payer: MEDICAID | Attending: Obstetrics

## 2023-05-23 DIAGNOSIS — Z3689 Encounter for other specified antenatal screening: Secondary | ICD-10-CM | POA: Diagnosis present

## 2023-05-23 DIAGNOSIS — O99012 Anemia complicating pregnancy, second trimester: Secondary | ICD-10-CM

## 2023-05-23 DIAGNOSIS — O09212 Supervision of pregnancy with history of pre-term labor, second trimester: Secondary | ICD-10-CM | POA: Diagnosis not present

## 2023-05-23 DIAGNOSIS — D563 Thalassemia minor: Secondary | ICD-10-CM

## 2023-05-23 DIAGNOSIS — O34592 Maternal care for other abnormalities of gravid uterus, second trimester: Secondary | ICD-10-CM

## 2023-05-23 DIAGNOSIS — O09292 Supervision of pregnancy with other poor reproductive or obstetric history, second trimester: Secondary | ICD-10-CM | POA: Diagnosis not present

## 2023-05-23 DIAGNOSIS — Z3A23 23 weeks gestation of pregnancy: Secondary | ICD-10-CM

## 2023-05-23 DIAGNOSIS — Q513 Bicornate uterus: Secondary | ICD-10-CM | POA: Diagnosis not present

## 2023-06-16 ENCOUNTER — Other Ambulatory Visit: Payer: Self-pay

## 2023-06-16 ENCOUNTER — Ambulatory Visit: Payer: MEDICAID | Admitting: Family Medicine

## 2023-06-16 ENCOUNTER — Telehealth: Payer: Self-pay | Admitting: *Deleted

## 2023-06-16 ENCOUNTER — Encounter: Payer: Self-pay | Admitting: Family Medicine

## 2023-06-16 VITALS — BP 108/67 | HR 97 | Wt 179.0 lb

## 2023-06-16 DIAGNOSIS — O09212 Supervision of pregnancy with history of pre-term labor, second trimester: Secondary | ICD-10-CM

## 2023-06-16 DIAGNOSIS — O364XX0 Maternal care for intrauterine death, not applicable or unspecified: Secondary | ICD-10-CM

## 2023-06-16 DIAGNOSIS — O34592 Maternal care for other abnormalities of gravid uterus, second trimester: Secondary | ICD-10-CM

## 2023-06-16 DIAGNOSIS — O0990 Supervision of high risk pregnancy, unspecified, unspecified trimester: Secondary | ICD-10-CM

## 2023-06-16 DIAGNOSIS — O42919 Preterm premature rupture of membranes, unspecified as to length of time between rupture and onset of labor, unspecified trimester: Secondary | ICD-10-CM

## 2023-06-16 DIAGNOSIS — O09899 Supervision of other high risk pregnancies, unspecified trimester: Secondary | ICD-10-CM

## 2023-06-16 DIAGNOSIS — O09292 Supervision of pregnancy with other poor reproductive or obstetric history, second trimester: Secondary | ICD-10-CM

## 2023-06-16 DIAGNOSIS — Z3A26 26 weeks gestation of pregnancy: Secondary | ICD-10-CM

## 2023-06-16 DIAGNOSIS — Z8751 Personal history of pre-term labor: Secondary | ICD-10-CM

## 2023-06-16 DIAGNOSIS — Z8759 Personal history of other complications of pregnancy, childbirth and the puerperium: Secondary | ICD-10-CM

## 2023-06-16 DIAGNOSIS — Q513 Bicornate uterus: Secondary | ICD-10-CM

## 2023-06-16 NOTE — Telephone Encounter (Signed)
Received a call from call a nurse that patient says she has glucose test today and wants to know if she can eat. I explained she is not schedule for glucose test today, but has ob fu in pm.  Will schedule gtt at visit today. Nancy Fetter

## 2023-06-16 NOTE — Progress Notes (Signed)
   PRENATAL VISIT NOTE  Subjective:  Veronica Mclean is a 27 y.o. G3P0201 at [redacted]w[redacted]d being seen today for ongoing prenatal care.  She is currently monitored for the following issues for this high-risk pregnancy and has HSV-1 (herpes simplex virus 1) infection; IUFD at 20 weeks or more of gestation; Bicornate uterus; Supervision of high risk pregnancy, antepartum; History of preterm labor; History of IUFD; History of preterm delivery, currently pregnant; and Alpha thalassemia silent carrier on their problem list.  Patient reports no complaints.  Contractions: Not present. Vag. Bleeding: None.  Movement: Present. Denies leaking of fluid.   The following portions of the patient's history were reviewed and updated as appropriate: allergies, current medications, past family history, past medical history, past social history, past surgical history and problem list.   Objective:   Vitals:   06/16/23 1324  BP: 108/67  Pulse: 97  Weight: 179 lb (81.2 kg)    Fetal Status: Fetal Heart Rate (bpm): 155   Movement: Present     General:  Alert, oriented and cooperative. Patient is in no acute distress.  Skin: Skin is warm and dry. No rash noted.   Cardiovascular: Normal heart rate noted  Respiratory: Normal respiratory effort, no problems with respiration noted  Abdomen: Soft, gravid, appropriate for gestational age.  Pain/Pressure: Absent     Pelvic: Cervical exam deferred        Extremities: Normal range of motion.  Edema: None  Mental Status: Normal mood and affect. Normal behavior. Normal judgment and thought content.   Assessment and Plan:  Pregnancy: G3P0201 at [redacted]w[redacted]d 1. Bicornate uterus  2. History of preterm labor Delivery at 35wks  3. History of IUFD @26  weeks  Needs ante testing starting at 28 weeks IOL at 39 weeks - [place] MFM FETAL BPP WO NON STRESS; Future  4. History of preterm delivery, currently pregnant  5. Supervision of high risk pregnancy, antepartum Up to  date Feeling normal movement Had a nose bleed this weekend, recommended using coconut oil or vaseline in nares at night to help with mucous membrane hydration.  Reviewed need for BPP weekly starting at 28 weeks - [place] MFM FETAL BPP WO NON STRESS; Future   Preterm labor symptoms and general obstetric precautions including but not limited to vaginal bleeding, contractions, leaking of fluid and fetal movement were reviewed in detail with the patient. Please refer to After Visit Summary for other counseling recommendations.   Return in about 2 weeks (around 06/30/2023) for Routine prenatal care.  Future Appointments  Date Time Provider Department Center  07/02/2023  7:15 AM [facility] NURSE [facility]  07/02/2023  7:30 AM [facility] US1 [facility]    Federico Flake, MD

## 2023-06-23 ENCOUNTER — Ambulatory Visit: Payer: MEDICAID | Admitting: Family Medicine

## 2023-06-23 ENCOUNTER — Other Ambulatory Visit: Payer: Self-pay

## 2023-06-23 ENCOUNTER — Other Ambulatory Visit: Payer: MEDICAID

## 2023-06-23 VITALS — BP 114/68 | HR 91

## 2023-06-23 DIAGNOSIS — Z3A27 27 weeks gestation of pregnancy: Secondary | ICD-10-CM

## 2023-06-23 DIAGNOSIS — R040 Epistaxis: Secondary | ICD-10-CM

## 2023-06-23 DIAGNOSIS — O26892 Other specified pregnancy related conditions, second trimester: Secondary | ICD-10-CM

## 2023-06-23 NOTE — Progress Notes (Unsigned)
   PRENATAL VISIT NOTE  Subjective:  Veronica Mclean is a 27 y.o. G3P0201 at [redacted]w[redacted]d being seen today for ongoing prenatal care.  She is currently monitored for the following issues for this high-risk pregnancy and has HSV-1 (herpes simplex virus 1) infection; IUFD at 20 weeks or more of gestation; Bicornate uterus; Supervision of high risk pregnancy, antepartum; History of preterm labor; History of IUFD; History of preterm delivery, currently pregnant; and Alpha thalassemia silent carrier on their problem list.  Patient reports  Nose bleed and then seeing something in her nose .  Contractions: Not present. Vag. Bleeding: None.  Movement: Present. Denies leaking of fluid.   The following portions of the patient's history were reviewed and updated as appropriate: allergies, current medications, past family history, past medical history, past social history, past surgical history and problem list.   Objective:   Vitals:   06/23/23 0918  BP: 114/68  Pulse: 91    Fetal Status: Fetal Heart Rate (bpm): 151   Movement: Present     HEENT- Inflamed rubinates, erythematous General:  Alert, oriented and cooperative. Patient is in no acute distress.  Skin: Skin is warm and dry. No rash noted.   Cardiovascular: Normal heart rate noted  Respiratory: Normal respiratory effort, no problems with respiration noted  Abdomen: Soft, gravid, appropriate for gestational age.  Pain/Pressure: Absent     Pelvic: Cervical exam deferred        Extremities: Normal range of motion.  Edema: None  Mental Status: Normal mood and affect. Normal behavior. Normal judgment and thought content.   Assessment and Plan:  Pregnancy: G3P0201 at [redacted]w[redacted]d 1. Nosebleed - Inflamed superior turbinate - Recommended use of vaseline BID - Recommended nasal saline multiple times per day - Consider humidifier in her room  Preterm labor symptoms and general obstetric precautions including but not limited to vaginal bleeding,  contractions, leaking of fluid and fetal movement were reviewed in detail with the patient. Please refer to After Visit Summary for other counseling recommendations.   No follow-ups on file.  Future Appointments  Date Time Provider Department Center  06/23/2023  9:35 AM Federico Flake, MD [facility]  06/30/2023  9:15 AM Sue Lush, FNP [facility]  07/02/2023  7:15 AM [facility] NURSE [facility]  07/02/2023  7:30 AM [facility] US1 [facility]    Federico Flake, MD

## 2023-06-24 LAB — GLUCOSE TOLERANCE, 2 HOURS W/ 1HR
Glucose, 1 hour: 177 mg/dL (ref 70–179)
Glucose, 2 hour: 104 mg/dL (ref 70–152)
Glucose, Fasting: 80 mg/dL (ref 70–91)

## 2023-06-24 LAB — CBC
Hematocrit: 30.8 % — ABNORMAL LOW (ref 34.0–46.6)
Hemoglobin: 10 g/dL — ABNORMAL LOW (ref 11.1–15.9)
MCH: 27.9 pg (ref 26.6–33.0)
MCHC: 32.5 g/dL (ref 31.5–35.7)
MCV: 86 fL (ref 79–97)
Platelets: 190 10*3/uL (ref 150–450)
RBC: 3.58 x10E6/uL — ABNORMAL LOW (ref 3.77–5.28)
RDW: 11.4 % — ABNORMAL LOW (ref 11.7–15.4)
WBC: 6.5 10*3/uL (ref 3.4–10.8)

## 2023-06-24 LAB — RPR: RPR Ser Ql: NONREACTIVE

## 2023-06-24 LAB — HIV ANTIBODY (ROUTINE TESTING W REFLEX): HIV Screen 4th Generation wRfx: NONREACTIVE

## 2023-06-25 ENCOUNTER — Encounter: Payer: Self-pay | Admitting: Obstetrics & Gynecology

## 2023-06-25 ENCOUNTER — Encounter: Payer: Self-pay | Admitting: Family Medicine

## 2023-06-25 ENCOUNTER — Other Ambulatory Visit: Payer: Self-pay | Admitting: Family Medicine

## 2023-06-25 DIAGNOSIS — O99013 Anemia complicating pregnancy, third trimester: Secondary | ICD-10-CM

## 2023-06-25 MED ORDER — FERROUS SULFATE 325 (65 FE) MG PO TBEC: 325.0000 mg | DELAYED_RELEASE_TABLET | ORAL | 2 refills | Status: DC

## 2023-06-30 ENCOUNTER — Other Ambulatory Visit: Payer: Self-pay

## 2023-06-30 ENCOUNTER — Ambulatory Visit: Payer: MEDICAID | Admitting: Obstetrics and Gynecology

## 2023-06-30 ENCOUNTER — Encounter: Payer: Self-pay | Admitting: Obstetrics and Gynecology

## 2023-06-30 VITALS — BP 123/69 | HR 104 | Wt 192.5 lb

## 2023-06-30 DIAGNOSIS — O99013 Anemia complicating pregnancy, third trimester: Secondary | ICD-10-CM

## 2023-06-30 DIAGNOSIS — O0990 Supervision of high risk pregnancy, unspecified, unspecified trimester: Secondary | ICD-10-CM

## 2023-06-30 DIAGNOSIS — Z23 Encounter for immunization: Secondary | ICD-10-CM

## 2023-06-30 DIAGNOSIS — O0993 Supervision of high risk pregnancy, unspecified, third trimester: Secondary | ICD-10-CM

## 2023-06-30 DIAGNOSIS — Z3A28 28 weeks gestation of pregnancy: Secondary | ICD-10-CM

## 2023-06-30 DIAGNOSIS — Q513 Bicornate uterus: Secondary | ICD-10-CM

## 2023-06-30 DIAGNOSIS — Z8759 Personal history of other complications of pregnancy, childbirth and the puerperium: Secondary | ICD-10-CM

## 2023-06-30 DIAGNOSIS — O09893 Supervision of other high risk pregnancies, third trimester: Secondary | ICD-10-CM

## 2023-06-30 DIAGNOSIS — O09899 Supervision of other high risk pregnancies, unspecified trimester: Secondary | ICD-10-CM

## 2023-06-30 NOTE — Progress Notes (Signed)
   PRENATAL VISIT NOTE  Subjective:  Veronica Mclean is a 27 y.o. G3P0200 at [redacted]w[redacted]d being seen today for ongoing prenatal care.  She is currently monitored for the following issues for this high-risk pregnancy and has HSV-1 (herpes simplex virus 1) infection; IUFD at 20 weeks or more of gestation; Bicornate uterus; Supervision of high risk pregnancy, antepartum; History of preterm labor; History of IUFD; History of preterm delivery, currently pregnant; Alpha thalassemia silent carrier; and Anemia complicating pregnancy, third trimester on their problem list.  Patient reports belly tightening, lower leg swelling   Contractions: Irritability. Vag. Bleeding: None.  Movement: Present. Denies leaking of fluid.   The following portions of the patient's history were reviewed and updated as appropriate: allergies, current medications, past family history, past medical history, past social history, past surgical history and problem list.   Objective:   Vitals:   06/30/23 0941  BP: 123/69  Pulse: (!) 104  Weight: 192 lb 8 oz (87.3 kg)    Fetal Status: Fetal Heart Rate (bpm): 152   Movement: Present     General:  Alert, oriented and cooperative. Patient is in no acute distress.  Skin: Skin is warm and dry. No rash noted.   Cardiovascular: Normal heart rate noted  Respiratory: Normal respiratory effort, no problems with respiration noted  Abdomen: Soft, gravid, appropriate for gestational age.  Pain/Pressure: Present     Pelvic: Cervical exam deferred        Extremities: Normal range of motion.  Edema: None  Mental Status: Normal mood and affect. Normal behavior. Normal judgment and thought content.   Assessment and Plan:  Pregnancy: G3P0200 at [redacted]w[redacted]d 1. Supervision of high risk pregnancy, antepartum BP and FHR normal Doing well, feeling regular movement   - Tdap vaccine greater than or equal to 7yo IM  2. Anemia complicating pregnancy, third trimester Has not picked up oral iron yet,  discussed recommendation why we encourage iron at this time  3. Bicornate uterus   4. History of preterm delivery, currently pregnant @35  weeks, no ptl signs, precautions when to go to hospital  5. History of IUFD @26  wks BPP 12/11   6. [redacted] weeks gestation of pregnancy Tdap given today Supportive measures and precautions discussed for cramping and swelling, including compression socks, maternity belt given    Preterm labor symptoms and general obstetric precautions including but not limited to vaginal bleeding, contractions, leaking of fluid and fetal movement were reviewed in detail with the patient. Please refer to After Visit Summary for other counseling recommendations.   Return in about 2 weeks (around 07/14/2023) for OB VISIT (MD or APP). Future Appointments  Date Time Provider Department Center  07/02/2023  7:15 AM [facility] NURSE [facility]  07/02/2023  7:30 AM [facility] US1 [facility]  07/14/2023 10:15 AM Wyn Forster, MD [facility]    Albertine Grates, FNP

## 2023-07-02 ENCOUNTER — Other Ambulatory Visit: Payer: Self-pay

## 2023-07-02 ENCOUNTER — Other Ambulatory Visit: Payer: Self-pay | Admitting: Obstetrics and Gynecology

## 2023-07-02 ENCOUNTER — Ambulatory Visit: Payer: MEDICAID

## 2023-07-02 ENCOUNTER — Ambulatory Visit: Payer: MEDICAID | Attending: Obstetrics and Gynecology | Admitting: *Deleted

## 2023-07-02 ENCOUNTER — Other Ambulatory Visit: Payer: Self-pay | Admitting: *Deleted

## 2023-07-02 VITALS — BP 124/55 | HR 106

## 2023-07-02 DIAGNOSIS — Z3A28 28 weeks gestation of pregnancy: Secondary | ICD-10-CM | POA: Insufficient documentation

## 2023-07-02 DIAGNOSIS — O34592 Maternal care for other abnormalities of gravid uterus, second trimester: Secondary | ICD-10-CM | POA: Diagnosis not present

## 2023-07-02 DIAGNOSIS — O09293 Supervision of pregnancy with other poor reproductive or obstetric history, third trimester: Secondary | ICD-10-CM

## 2023-07-02 DIAGNOSIS — Z8759 Personal history of other complications of pregnancy, childbirth and the puerperium: Secondary | ICD-10-CM

## 2023-07-02 DIAGNOSIS — O09213 Supervision of pregnancy with history of pre-term labor, third trimester: Secondary | ICD-10-CM

## 2023-07-02 DIAGNOSIS — Q513 Bicornate uterus: Secondary | ICD-10-CM | POA: Diagnosis not present

## 2023-07-02 DIAGNOSIS — O34593 Maternal care for other abnormalities of gravid uterus, third trimester: Secondary | ICD-10-CM | POA: Diagnosis not present

## 2023-07-02 DIAGNOSIS — Z148 Genetic carrier of other disease: Secondary | ICD-10-CM | POA: Diagnosis not present

## 2023-07-02 DIAGNOSIS — O99213 Obesity complicating pregnancy, third trimester: Secondary | ICD-10-CM

## 2023-07-02 DIAGNOSIS — O0990 Supervision of high risk pregnancy, unspecified, unspecified trimester: Secondary | ICD-10-CM

## 2023-07-02 DIAGNOSIS — O99013 Anemia complicating pregnancy, third trimester: Secondary | ICD-10-CM | POA: Diagnosis not present

## 2023-07-02 DIAGNOSIS — D5740 Sickle-cell thalassemia without crisis: Secondary | ICD-10-CM

## 2023-07-14 ENCOUNTER — Other Ambulatory Visit: Payer: Self-pay

## 2023-07-14 ENCOUNTER — Ambulatory Visit: Payer: MEDICAID | Admitting: Obstetrics and Gynecology

## 2023-07-14 ENCOUNTER — Other Ambulatory Visit (HOSPITAL_COMMUNITY)
Admission: RE | Admit: 2023-07-14 | Discharge: 2023-07-14 | Disposition: A | Discharge status: Home / Self Care | Payer: MEDICAID | Source: Ambulatory Visit | Attending: Family Medicine | Admitting: Family Medicine

## 2023-07-14 VITALS — BP 123/67 | HR 113 | Wt 193.7 lb

## 2023-07-14 DIAGNOSIS — N898 Other specified noninflammatory disorders of vagina: Secondary | ICD-10-CM | POA: Insufficient documentation

## 2023-07-14 DIAGNOSIS — O99013 Anemia complicating pregnancy, third trimester: Secondary | ICD-10-CM

## 2023-07-14 DIAGNOSIS — Z8751 Personal history of pre-term labor: Secondary | ICD-10-CM

## 2023-07-14 DIAGNOSIS — O09213 Supervision of pregnancy with history of pre-term labor, third trimester: Secondary | ICD-10-CM

## 2023-07-14 DIAGNOSIS — Z8759 Personal history of other complications of pregnancy, childbirth and the puerperium: Secondary | ICD-10-CM

## 2023-07-14 DIAGNOSIS — Q513 Bicornate uterus: Secondary | ICD-10-CM

## 2023-07-14 DIAGNOSIS — O0993 Supervision of high risk pregnancy, unspecified, third trimester: Secondary | ICD-10-CM

## 2023-07-14 DIAGNOSIS — O0990 Supervision of high risk pregnancy, unspecified, unspecified trimester: Secondary | ICD-10-CM

## 2023-07-14 DIAGNOSIS — O98313 Other infections with a predominantly sexual mode of transmission complicating pregnancy, third trimester: Secondary | ICD-10-CM

## 2023-07-14 DIAGNOSIS — Z3A30 30 weeks gestation of pregnancy: Secondary | ICD-10-CM

## 2023-07-14 DIAGNOSIS — B009 Herpesviral infection, unspecified: Secondary | ICD-10-CM

## 2023-07-14 DIAGNOSIS — O364XX0 Maternal care for intrauterine death, not applicable or unspecified: Secondary | ICD-10-CM

## 2023-07-14 LAB — POCT URINALYSIS DIP (DEVICE)
Bilirubin Urine: NEGATIVE
Glucose, UA: NEGATIVE mg/dL
Hgb urine dipstick: NEGATIVE
Ketones, ur: NEGATIVE mg/dL
Leukocytes,Ua: NEGATIVE
Nitrite: NEGATIVE
Protein, ur: NEGATIVE mg/dL
Specific Gravity, Urine: 1.02 (ref 1.005–1.030)
Urobilinogen, UA: 0.2 mg/dL (ref 0.0–1.0)
pH: 7 (ref 5.0–8.0)

## 2023-07-14 NOTE — Progress Notes (Signed)
PRENATAL VISIT NOTE  Subjective:  Veronica Mclean is a 27 y.o. G3P0200 at [redacted]w[redacted]d being seen today for ongoing prenatal care.  She is currently monitored for the following issues for this high-risk pregnancy and has HSV-1 (herpes simplex virus 1) infection; IUFD at 20 weeks or more of gestation; Bicornate uterus; Supervision of high risk pregnancy, antepartum; History of preterm labor; History of IUFD; History of preterm delivery, currently pregnant; Alpha thalassemia silent carrier; and Anemia complicating pregnancy, third trimester on their problem list.  Patient reports  dysuria and discharge .   Contractions: Not present. Vag. Bleeding: None.  Movement: Present. Denies leaking of fluid.   The following portions of the patient's history were reviewed and updated as appropriate: allergies, current medications, past family history, past medical history, past social history, past surgical history and problem list.   Objective:   Vitals:   07/14/23 1029  BP: 123/67  Pulse: (!) 113  Weight: 193 lb 11.2 oz (87.9 kg)    Fetal Status: Fetal Heart Rate (bpm): 142   Movement: Present     General:  Alert, oriented and cooperative. Patient is in no acute distress.  Skin: Skin is warm and dry. No rash noted.   Cardiovascular: Normal heart rate noted  Respiratory: Normal respiratory effort, no problems with respiration noted  Abdomen: Soft, gravid, appropriate for gestational age.  Pain/Pressure: Absent     Pelvic: Cervical exam deferred        Extremities: Normal range of motion.  Edema: None  Mental Status: Normal mood and affect. Normal behavior. Normal judgment and thought content.   Assessment and Plan:  Pregnancy: G3P0200 at [redacted]w[redacted]d  Veronica Mclean was seen today for routine prenatal visit.  Supervision of high risk pregnancy, antepartum Overview:  NURSING  PROVIDER  Office Location [facility] Dating by LMP c/w U/S at 10 wks  [facility] Model Traditional Anatomy U/S   Initiated care at  9wks                 Language  English              LAB RESULTS   Support Person Mother(Elanor) Genetics NIPS: LR AFP:     NT/IT (FT only)     Carrier Screen Horizon:   Rhogam  B/Positive/-- (08/29 1502) A1C/GTT Early: 5.3 Third trimester:   Flu Vaccine Decline    TDaP Vaccine  06/30/23 Blood Type B/Positive/-- (08/29 1502)  Covid Vaccine Vaccinated  Antibody Negative (08/29 1502)    Rubella 9.61 (08/29 1502)  Feeding Plan breast RPR Non Reactive (08/29 1502)  Contraception Pills HBsAg Negative (08/29 1502)  Circumcision  Yes if female HIV Non Reactive (08/29 1502)  Pediatrician    HCVAb Non Reactive (08/29 1502)  Prenatal Classes       Pap No results found for: "DIAGPAP"  BTLConsent  GC/CT Initial:   36wks:    VBAC  Consent  GBS   For PCN allergy, check sensitivities        DME Rx [x]  BP cuff [ ]  Weight Scale Waterbirth  [ ]  Class [ ]  Consent [ ]  CNM visit  PHQ9 & GAD7 [x]  new OB [  ] 28 weeks  [  ] 36 weeks Induction  [ ]  Orders Entered [ ] Foley Y/N      IUFD at 20 weeks or more of gestation Overview: @26  weeks   History of IUFD  History of preterm labor  Anemia complicating pregnancy, third trimester  HSV-1 (herpes simplex virus 1)  infection Overview: 06/2013  genitally   Bicornate uterus  [redacted] weeks gestation of pregnancy  Vaginal discharge -     Cervicovaginal ancillary only  Follow up on wet prep and UA results, treat as indicated.  Scheduled for BPP  per MFM on 08/27/2023  Preterm labor symptoms and general obstetric precautions including but not limited to vaginal bleeding, contractions, leaking of fluid and fetal movement were reviewed in detail with the patient. Please refer to After Visit Summary for other counseling recommendations.   Return in about 2 weeks (around 07/28/2023) for HROB.  Future Appointments  Date Time Provider Department Center  08/27/2023  3:15 PM [facility] NURSE [facility]  08/27/2023  3:30 PM [facility] US2 [facility]    Wyn Forster,  MD FMOB Fellow, Faculty practice [facility], Center for [facility] 07/14/23  10:37 AM

## 2023-07-15 LAB — CERVICOVAGINAL ANCILLARY ONLY
Bacterial Vaginitis (gardnerella): NEGATIVE
Candida Glabrata: NEGATIVE
Candida Vaginitis: NEGATIVE
Chlamydia: NEGATIVE
Comment: NEGATIVE
Comment: NEGATIVE
Comment: NEGATIVE
Comment: NEGATIVE
Comment: NEGATIVE
Comment: NORMAL
Neisseria Gonorrhea: NEGATIVE
Trichomonas: NEGATIVE

## 2023-07-23 NOTE — L&D Delivery Note (Signed)
 OB/GYN Faculty Practice Delivery Note  Veronica Mclean is a 28 y.o. G3P0200 s/p SVD at [redacted]w[redacted]d. She was admitted for eIOL given hx of IUFD and SIDS.   ROM: 6h 70m with clear fluid GBS Status:  Positive/-- (02/06 1149) Maximum Maternal Temperature: 98.2F  Labor Progress: Initial SVE: 1/T/ballotable. She received multiple rounds of cytotec, pitocin, and AROM.  She then progressed to complete.   Delivery Date/Time: 2123 09/15/23 Delivery: Called to room and patient was complete and pushing. Head delivered LOA. No nuchal cord present. Shoulder and body delivered in usual fashion. Infant with spontaneous cry, placed on mother's abdomen, dried and stimulated. Cord clamped x 2 after 1-minute delay, and cut by Veronica Mclean. Cord blood drawn. Placenta delivered spontaneously with gentle cord traction. Fundus firm with massage, lower uterine sweep, rectal cytotec 1g, IM  methergine x1, TXA x2  and Pitocin. Labia, perineum, vagina, and cervix inspected without laceration. Baby doing well.  Mom with pain with fundal rubs and anxious, Afebrile in s/o cytotec, VSS, SORA.  Will continue to monitor s/p Atarax (PRN requested per patient) and IV Reglan.   Baby Weight: pending  Placenta: 3 vessel, intact. Sent to L&D Complications: None Lacerations: None EBL: 337 mL Analgesia: Epidural   Infant:  APGAR (1 MIN):  9 APGAR (5 MINS):  9  Veronica Dibble, MD [facility] Family Medicine Fellow, [facility] for [facility], [facility] 09/15/2023, 9:55 PM

## 2023-07-29 ENCOUNTER — Encounter: Payer: MEDICAID | Admitting: Obstetrics and Gynecology

## 2023-07-29 ENCOUNTER — Encounter: Payer: Self-pay | Admitting: Obstetrics and Gynecology

## 2023-07-31 NOTE — Progress Notes (Signed)
 Patient did not keep her OB appointment for 07/29/2023.  Cornelia Copa MD Attending Center for [employer] Midwife)

## 2023-08-04 ENCOUNTER — Inpatient Hospital Stay (HOSPITAL_COMMUNITY)
Admission: AD | Admit: 2023-08-04 | Discharge: 2023-08-04 | Disposition: A | Discharge status: Home / Self Care | Payer: MEDICAID | Attending: Obstetrics & Gynecology | Admitting: Obstetrics & Gynecology

## 2023-08-04 ENCOUNTER — Telehealth: Payer: Self-pay | Admitting: Family Medicine

## 2023-08-04 ENCOUNTER — Encounter (HOSPITAL_COMMUNITY): Payer: Self-pay | Admitting: Obstetrics & Gynecology

## 2023-08-04 ENCOUNTER — Other Ambulatory Visit: Payer: Self-pay

## 2023-08-04 DIAGNOSIS — O0990 Supervision of high risk pregnancy, unspecified, unspecified trimester: Secondary | ICD-10-CM

## 2023-08-04 DIAGNOSIS — O4703 False labor before 37 completed weeks of gestation, third trimester: Secondary | ICD-10-CM | POA: Insufficient documentation

## 2023-08-04 DIAGNOSIS — O479 False labor, unspecified: Secondary | ICD-10-CM

## 2023-08-04 DIAGNOSIS — Z3A33 33 weeks gestation of pregnancy: Secondary | ICD-10-CM | POA: Diagnosis not present

## 2023-08-04 LAB — URINALYSIS, ROUTINE W REFLEX MICROSCOPIC
Bilirubin Urine: NEGATIVE
Glucose, UA: 50 mg/dL — AB
Hgb urine dipstick: NEGATIVE
Ketones, ur: NEGATIVE mg/dL
Nitrite: NEGATIVE
Protein, ur: NEGATIVE mg/dL
Specific Gravity, Urine: 1.011 (ref 1.005–1.030)
pH: 6 (ref 5.0–8.0)

## 2023-08-04 LAB — WET PREP, GENITAL
Clue Cells Wet Prep HPF POC: NONE SEEN
Sperm: NONE SEEN
Trich, Wet Prep: NONE SEEN
WBC, Wet Prep HPF POC: >=10 — AB (ref <10)
Yeast Wet Prep HPF POC: NONE SEEN

## 2023-08-04 LAB — RUPTURE OF MEMBRANE (ROM)PLUS: Rom Plus: NEGATIVE

## 2023-08-04 NOTE — MAU Note (Signed)
.  Veronica Mclean is a 28 y.o. at [redacted]w[redacted]d here in MAU reporting: around 2100 last night she noticed she had soiled underwear with a pinkish tint and it has continues to leak since then. Also reports some mild lower abdominal cramping. Denies VB. +FM.   Pain score: 2 Vitals:   08/04/23 0943  BP: 126/66  Pulse: (!) 107  Resp: 14  Temp: 98.4 F (36.9 C)  SpO2: 100%     FHT:160 Lab orders placed from triage:   mau labor

## 2023-08-04 NOTE — MAU Provider Note (Signed)
 ?Rupture    S Ms. Veronica Mclean is a 28 y.o. G53P0200 pregnant female at [redacted]w[redacted]d who presents to MAU today with complaint of soiled underwear with pinkish tint since 2100 1/12. Associated mild lower abdominal cramping as well.  Denies VB.  Endorses +FM.   Hx of PTD at 26weeks with fetal demise and another at 35weeks (offspring now deceased, SIDs).     Receives care at [facility]. Prenatal records reviewed.  Pertinent items noted in HPI and remainder of comprehensive ROS otherwise negative.   O BP (!) 112/58   Pulse (!) 125   Temp 98.4 F (36.9 C) (Oral)   Resp 20   Wt 88.4 kg   LMP 12/04/2022 (Approximate)   SpO2 99%   BMI 30.53 kg/m  Physical Exam Vitals and nursing note reviewed. Exam conducted with a chaperone present.  Constitutional:      General: She is not in acute distress.    Appearance: Normal appearance. She is not ill-appearing.  HENT:     Head: Normocephalic and atraumatic.     Right Ear: External ear normal.     Left Ear: External ear normal.     Nose: Nose normal.     Mouth/Throat:     Mouth: Mucous membranes are moist.     Pharynx: Oropharynx is clear.  Eyes:     Extraocular Movements: Extraocular movements intact.     Conjunctiva/sclera: Conjunctivae normal.  Cardiovascular:     Rate and Rhythm: Tachycardia present.  Pulmonary:     Effort: Pulmonary effort is normal. No respiratory distress.  Abdominal:     Comments: gravid  Genitourinary:    General: Normal vulva.     Comments: No pooling, no HSV lesions, CVE 1/20/B Musculoskeletal:        General: No swelling. Normal range of motion.     Cervical back: Normal range of motion.  Skin:    General: Skin is warm and dry.  Neurological:     Mental Status: She is alert and oriented to person, place, and time. Mental status is at baseline.     Motor: No weakness.     Gait: Gait normal.  Psychiatric:        Behavior: Behavior normal.        Thought Content: Thought content normal.        Judgment:  Judgment normal.     Comments: Anxious given birth hx      MDM: MAU Course:  Fern negative ROM plus negative  Wet prep negative  GC collected UA noninfectious   NST 150bpm, moderate variability, +accels, no decels, ctx pt reports very irregular but baseline uterine irritability     Irritbility subsided on toco and no contractions.  Negative work up.  Spoke with attending and they are okay with d/c if patient is assured.  Offered pt BPP for further reassurance given her hx but she politely declines.  Stable for D/c at this time.   A/P: #[redacted] weeks gestation #False labor - f/u GC swabs   Discharge from MAU in stable condition with strict/usual precautions Follow up at [facility] as scheduled for ongoing prenatal care  Allergies as of 08/04/2023   No Known Allergies      Medication List     TAKE these medications    ferrous sulfate  325 (65 FE) MG EC tablet Take 1 tablet (325 mg total) by mouth every other day.   Vitafol  Gummies 3.33-0.333-34.8 MG Chew Chew 1 tablet by mouth daily.  Jhonny Augustin BROCKS, MD 08/04/2023 12:18 PM

## 2023-08-04 NOTE — Telephone Encounter (Signed)
 Patient Mother called in sating they need to speak with a Nurse ASAP, patient is not feeling well and want to speak with a nurse.

## 2023-08-05 LAB — GC/CHLAMYDIA PROBE AMP (~~LOC~~) NOT AT ARMC
Chlamydia: NEGATIVE
Comment: NEGATIVE
Comment: NORMAL
Neisseria Gonorrhea: NEGATIVE

## 2023-08-07 NOTE — Telephone Encounter (Signed)
Per chart review, patient seen at MAU day of phone call. Called pt to follow up; VM left stating patient may call or message in response if needed.

## 2023-08-27 ENCOUNTER — Other Ambulatory Visit: Payer: Self-pay | Admitting: Obstetrics and Gynecology

## 2023-08-27 ENCOUNTER — Ambulatory Visit: Payer: MEDICAID | Attending: Obstetrics and Gynecology

## 2023-08-27 ENCOUNTER — Ambulatory Visit: Payer: MEDICAID | Admitting: *Deleted

## 2023-08-27 ENCOUNTER — Other Ambulatory Visit: Payer: Self-pay

## 2023-08-27 VITALS — BP 123/73 | HR 96

## 2023-08-27 DIAGNOSIS — O09213 Supervision of pregnancy with history of pre-term labor, third trimester: Secondary | ICD-10-CM | POA: Diagnosis not present

## 2023-08-27 DIAGNOSIS — Q513 Bicornate uterus: Secondary | ICD-10-CM | POA: Diagnosis not present

## 2023-08-27 DIAGNOSIS — O09293 Supervision of pregnancy with other poor reproductive or obstetric history, third trimester: Secondary | ICD-10-CM

## 2023-08-27 DIAGNOSIS — O99013 Anemia complicating pregnancy, third trimester: Secondary | ICD-10-CM

## 2023-08-27 DIAGNOSIS — D5740 Sickle-cell thalassemia without crisis: Secondary | ICD-10-CM

## 2023-08-27 DIAGNOSIS — O0990 Supervision of high risk pregnancy, unspecified, unspecified trimester: Secondary | ICD-10-CM | POA: Diagnosis present

## 2023-08-27 DIAGNOSIS — Z3A37 37 weeks gestation of pregnancy: Secondary | ICD-10-CM

## 2023-08-27 DIAGNOSIS — Z8759 Personal history of other complications of pregnancy, childbirth and the puerperium: Secondary | ICD-10-CM

## 2023-08-27 DIAGNOSIS — O34593 Maternal care for other abnormalities of gravid uterus, third trimester: Secondary | ICD-10-CM | POA: Diagnosis not present

## 2023-08-27 DIAGNOSIS — Z3A36 36 weeks gestation of pregnancy: Secondary | ICD-10-CM

## 2023-08-27 NOTE — Procedures (Signed)
 Veronica Mclean August 27, 1995 [redacted]w[redacted]d  Fetus A Non-Stress Test Interpretation for 08/27/23   BPP with NST  Indication: Unsatisfactory BPP  Fetal Heart Rate A Mode: External Baseline Rate (A): 140 bpm Variability: Moderate Accelerations: 15 x 15 Decelerations: None Multiple birth?: No  Uterine Activity Mode: Palpation, Toco Contraction Frequency (min): none Resting Tone Palpated: Relaxed  Interpretation (Fetal Testing) Nonstress Test Interpretation: Reactive Overall Impression: Reassuring for gestational age Comments: Dr. William reviewed tracing

## 2023-08-28 ENCOUNTER — Encounter: Payer: Self-pay | Admitting: Family Medicine

## 2023-08-28 ENCOUNTER — Other Ambulatory Visit (HOSPITAL_COMMUNITY)
Admission: RE | Admit: 2023-08-28 | Discharge: 2023-08-28 | Disposition: A | Discharge status: Home / Self Care | Payer: MEDICAID | Source: Ambulatory Visit | Attending: Obstetrics and Gynecology | Admitting: Obstetrics and Gynecology

## 2023-08-28 ENCOUNTER — Ambulatory Visit: Payer: MEDICAID | Admitting: Family Medicine

## 2023-08-28 VITALS — BP 117/75 | HR 92 | Wt 198.4 lb

## 2023-08-28 DIAGNOSIS — O0993 Supervision of high risk pregnancy, unspecified, third trimester: Secondary | ICD-10-CM

## 2023-08-28 DIAGNOSIS — O0990 Supervision of high risk pregnancy, unspecified, unspecified trimester: Secondary | ICD-10-CM | POA: Insufficient documentation

## 2023-08-28 DIAGNOSIS — O364XX0 Maternal care for intrauterine death, not applicable or unspecified: Secondary | ICD-10-CM

## 2023-08-28 DIAGNOSIS — B009 Herpesviral infection, unspecified: Secondary | ICD-10-CM

## 2023-08-28 DIAGNOSIS — O99013 Anemia complicating pregnancy, third trimester: Secondary | ICD-10-CM

## 2023-08-28 DIAGNOSIS — O99820 Streptococcus B carrier state complicating pregnancy: Secondary | ICD-10-CM

## 2023-08-28 DIAGNOSIS — Z3A36 36 weeks gestation of pregnancy: Secondary | ICD-10-CM

## 2023-08-28 DIAGNOSIS — N898 Other specified noninflammatory disorders of vagina: Secondary | ICD-10-CM

## 2023-08-28 DIAGNOSIS — Q513 Bicornate uterus: Secondary | ICD-10-CM

## 2023-08-28 MED ORDER — VALACYCLOVIR HCL 500 MG PO TABS: 500.0000 mg | ORAL_TABLET | Freq: Two times a day (BID) | ORAL | 2 refills | Status: DC

## 2023-08-28 MED ORDER — FLUCONAZOLE 150 MG PO TABS: 150.0000 mg | ORAL_TABLET | Freq: Once | ORAL | 0 refills | Status: AC

## 2023-08-28 NOTE — Progress Notes (Signed)
   Subjective:  Veronica Mclean is a 28 y.o. G3P0200 at [redacted]w[redacted]d being seen today for ongoing prenatal care.  She is currently monitored for the following issues for this high-risk pregnancy and has HSV-1 (herpes simplex virus 1) infection; IUFD at 20 weeks or more of gestation; Bicornate uterus; Supervision of high risk pregnancy, antepartum; History of preterm labor; History of IUFD; History of preterm delivery, currently pregnant; Alpha thalassemia silent carrier; and Anemia complicating pregnancy, third trimester on their problem list.  Patient reports vaginal irritation.  Contractions: Not present. Vag. Bleeding: None.  Movement: Present. Denies leaking of fluid.   The following portions of the patient's history were reviewed and updated as appropriate: allergies, current medications, past family history, past medical history, past social history, past surgical history and problem list. Problem list updated.  Objective:   Vitals:   08/28/23 0928  BP: 117/75  Pulse: 92  Weight: 198 lb 6.4 oz (90 kg)    Fetal Status: Fetal Heart Rate (bpm): 157   Movement: Present  Presentation: Vertex  General:  Alert, oriented and cooperative. Patient is in no acute distress.  Skin: Skin is warm and dry. No rash noted.   Cardiovascular: Normal heart rate noted  Respiratory: Normal respiratory effort, no problems with respiration noted  Abdomen: Soft, gravid, appropriate for gestational age. Pain/Pressure: Present     Pelvic: Vag. Bleeding: None     Cervical exam performed Dilation: 1.5 Effacement (%): Thick Station: -2  Extremities: Normal range of motion.  Edema: None  Mental Status: Normal mood and affect. Normal behavior. Normal judgment and thought content.   Urinalysis:      Assessment and Plan:  Pregnancy: G3P0200 at [redacted]w[redacted]d  1. Supervision of high risk pregnancy, antepartum (Primary) BP and FHR normal Swabs collected  Cervix still thick but with some dilation already Reports  symptoms of yeast infection, empiric fluconazole  sent along with swab - Culture, beta strep (group b only) - Cervicovaginal ancillary only( [place])  2. IUFD at 20 weeks or more of gestation At 75 weeks with first pregnancy, second child passed from SIDS Weekly fetal testing reassuring to date Delivery around [redacted] weeks gestation per MFM guidelines Scheduled for 09/13/2023 Form faxed, orders placed  3. HSV-1 (herpes simplex virus 1) infection Noted in chart after visit, rx sent for prophylaxis and message sent to patient to notify her  4. Bicornate uterus   5. Anemia complicating pregnancy, third trimester PO iron  Preterm labor symptoms and general obstetric precautions including but not limited to vaginal bleeding, contractions, leaking of fluid and fetal movement were reviewed in detail with the patient. Please refer to After Visit Summary for other counseling recommendations.  Return in 1 week (on 09/04/2023) for ob visit, HRC.   Lola Donnice HERO, MD

## 2023-08-28 NOTE — Patient Instructions (Signed)

## 2023-08-29 ENCOUNTER — Encounter: Payer: Self-pay | Admitting: Family Medicine

## 2023-08-29 LAB — CERVICOVAGINAL ANCILLARY ONLY
Bacterial Vaginitis (gardnerella): NEGATIVE
Candida Glabrata: NEGATIVE
Candida Vaginitis: POSITIVE — AB
Chlamydia: NEGATIVE
Comment: NEGATIVE
Comment: NEGATIVE
Comment: NEGATIVE
Comment: NEGATIVE
Comment: NEGATIVE
Comment: NORMAL
Neisseria Gonorrhea: NEGATIVE
Trichomonas: NEGATIVE

## 2023-08-31 LAB — CULTURE, BETA STREP (GROUP B ONLY): Strep Gp B Culture: POSITIVE — AB

## 2023-09-01 DIAGNOSIS — O99820 Streptococcus B carrier state complicating pregnancy: Secondary | ICD-10-CM | POA: Insufficient documentation

## 2023-09-01 MED ORDER — MICONAZOLE NITRATE 4 % VA CREA: 1.0000 | TOPICAL_CREAM | Freq: Every evening | VAGINAL | 1 refills | Status: DC

## 2023-09-01 MED ORDER — FLUCONAZOLE 150 MG PO TABS: 150.0000 mg | ORAL_TABLET | Freq: Once | ORAL | 0 refills | Status: AC

## 2023-09-01 NOTE — Addendum Note (Signed)
 Addended by: Braylon Lemmons on: 09/01/2023 08:41 AM   Modules accepted: Orders

## 2023-09-03 ENCOUNTER — Ambulatory Visit: Payer: MEDICAID | Admitting: *Deleted

## 2023-09-03 ENCOUNTER — Ambulatory Visit: Payer: MEDICAID | Attending: Obstetrics and Gynecology | Admitting: *Deleted

## 2023-09-03 ENCOUNTER — Other Ambulatory Visit: Payer: Self-pay

## 2023-09-03 ENCOUNTER — Other Ambulatory Visit: Payer: Self-pay | Admitting: *Deleted

## 2023-09-03 ENCOUNTER — Ambulatory Visit: Payer: MEDICAID

## 2023-09-03 VITALS — BP 115/64 | HR 108

## 2023-09-03 DIAGNOSIS — Z8759 Personal history of other complications of pregnancy, childbirth and the puerperium: Secondary | ICD-10-CM | POA: Diagnosis not present

## 2023-09-03 DIAGNOSIS — Z3A37 37 weeks gestation of pregnancy: Secondary | ICD-10-CM | POA: Diagnosis not present

## 2023-09-03 DIAGNOSIS — O0990 Supervision of high risk pregnancy, unspecified, unspecified trimester: Secondary | ICD-10-CM

## 2023-09-03 DIAGNOSIS — O09293 Supervision of pregnancy with other poor reproductive or obstetric history, third trimester: Secondary | ICD-10-CM | POA: Insufficient documentation

## 2023-09-03 NOTE — Procedures (Addendum)
Veronica Mclean 03-25-1996 [redacted]w[redacted]d  Fetus A Non-Stress Test Interpretation for 09/03/23  Indication:  hx IUFD  Fetal Heart Rate A Mode: External Baseline Rate (A): 145 bpm Variability: Moderate Accelerations: 15 x 15 Decelerations: None Multiple birth?: No  Uterine Activity Mode: Palpation, Toco Contraction Frequency (min): none Resting Tone Palpated: Relaxed  Interpretation (Fetal Testing) Nonstress Test Interpretation: Reactive Overall Impression: Reassuring for gestational age Comments: Dr. Judeth Cornfield reviewed tracing  MFM Note Modified BPP was performed. AFI 11 cm. NST is reactive. We reassured the patient of the findings.

## 2023-09-08 ENCOUNTER — Encounter (HOSPITAL_COMMUNITY): Payer: Self-pay

## 2023-09-08 ENCOUNTER — Telehealth (HOSPITAL_COMMUNITY): Payer: Self-pay | Admitting: *Deleted

## 2023-09-08 ENCOUNTER — Encounter (HOSPITAL_COMMUNITY): Payer: Self-pay | Admitting: *Deleted

## 2023-09-08 ENCOUNTER — Ambulatory Visit: Payer: MEDICAID | Admitting: Obstetrics & Gynecology

## 2023-09-08 ENCOUNTER — Other Ambulatory Visit: Payer: Self-pay

## 2023-09-08 VITALS — BP 123/74 | HR 103 | Wt 206.0 lb

## 2023-09-08 DIAGNOSIS — Z3A38 38 weeks gestation of pregnancy: Secondary | ICD-10-CM

## 2023-09-08 DIAGNOSIS — O0993 Supervision of high risk pregnancy, unspecified, third trimester: Secondary | ICD-10-CM

## 2023-09-08 DIAGNOSIS — B009 Herpesviral infection, unspecified: Secondary | ICD-10-CM | POA: Diagnosis not present

## 2023-09-08 DIAGNOSIS — O0990 Supervision of high risk pregnancy, unspecified, unspecified trimester: Secondary | ICD-10-CM

## 2023-09-08 NOTE — Progress Notes (Signed)
   PRENATAL VISIT NOTE  Subjective:  Veronica Mclean is a 28 y.o. G3P0200 at [redacted]w[redacted]d being seen today for ongoing prenatal care.  She is currently monitored for the following issues for this high-risk pregnancy and has HSV-1 (herpes simplex virus 1) infection; IUFD at 20 weeks or more of gestation; Bicornate uterus; Supervision of high risk pregnancy, antepartum; History of preterm labor; History of IUFD; History of preterm delivery, currently pregnant; Alpha thalassemia silent carrier; Anemia complicating pregnancy, third trimester; and GBS (group B Streptococcus carrier), +RV culture, currently pregnant on their problem list.  Patient reports no complaints.  Contractions: Irritability. Vag. Bleeding: None.  Movement: Present. Denies leaking of fluid.   The following portions of the patient's history were reviewed and updated as appropriate: allergies, current medications, past family history, past medical history, past social history, past surgical history and problem list.   Objective:   Vitals:   09/08/23 1324  BP: 123/74  Pulse: (!) 103  Weight: 206 lb (93.4 kg)    Fetal Status: Fetal Heart Rate (bpm): 155   Movement: Present     General:  Alert, oriented and cooperative. Patient is in no acute distress.  Skin: Skin is warm and dry. No rash noted.   Cardiovascular: Normal heart rate noted  Respiratory: Normal respiratory effort, no problems with respiration noted  Abdomen: Soft, gravid, appropriate for gestational age.  Pain/Pressure: Present (pressure)     Pelvic: Cervical exam deferred        Extremities: Normal range of motion.  Edema: Trace  Mental Status: Normal mood and affect. Normal behavior. Normal judgment and thought content.  Vaginal yeast, no lesions, vaginal exam done.  Assessment and Plan:  Pregnancy: G3P0200 at [redacted]w[redacted]d 1. Supervision of high risk pregnancy, antepartum (Primary)   2. HSV-1 (herpes simplex virus 1) infection No evidence of outbreak, only h/o  one outbreak of HSV 1 2014. Encouraged compliance with Valtrex  Term labor symptoms and general obstetric precautions including but not limited to vaginal bleeding, contractions, leaking of fluid and fetal movement were reviewed in detail with the patient. Please refer to After Visit Summary for other counseling recommendations.   Return if symptoms worsen or fail to improve.  Future Appointments  Date Time Provider Department Center  09/10/2023  3:00 PM [facility] NURSE [facility]  09/10/2023  3:15 PM [facility] NST [facility]  09/13/2023  6:45 AM [facility] SCHED ROOM [facility] None  09/15/2023  1:15 PM Warden Fillers, MD [facility]  09/17/2023  3:00 PM [facility] NURSE [facility]  09/17/2023  3:15 PM [facility] NST [facility]    Scheryl Darter, MD

## 2023-09-08 NOTE — Telephone Encounter (Signed)
 Preadmission screen

## 2023-09-09 ENCOUNTER — Inpatient Hospital Stay (HOSPITAL_COMMUNITY)
Admission: AD | Admit: 2023-09-09 | Discharge: 2023-09-09 | Disposition: A | Discharge status: Home / Self Care | Payer: MEDICAID | Attending: Obstetrics and Gynecology | Admitting: Obstetrics and Gynecology

## 2023-09-09 ENCOUNTER — Encounter (HOSPITAL_COMMUNITY): Payer: Self-pay | Admitting: Obstetrics and Gynecology

## 2023-09-09 DIAGNOSIS — N898 Other specified noninflammatory disorders of vagina: Secondary | ICD-10-CM | POA: Diagnosis not present

## 2023-09-09 DIAGNOSIS — O26893 Other specified pregnancy related conditions, third trimester: Secondary | ICD-10-CM | POA: Diagnosis not present

## 2023-09-09 DIAGNOSIS — Z3A38 38 weeks gestation of pregnancy: Secondary | ICD-10-CM | POA: Insufficient documentation

## 2023-09-09 DIAGNOSIS — Z0371 Encounter for suspected problem with amniotic cavity and membrane ruled out: Secondary | ICD-10-CM | POA: Insufficient documentation

## 2023-09-09 LAB — RUPTURE OF MEMBRANE (ROM)PLUS: Rom Plus: NEGATIVE

## 2023-09-09 LAB — POCT FERN TEST: POCT Fern Test: NEGATIVE

## 2023-09-09 NOTE — MAU Provider Note (Signed)
History     CSN: [id number]  Arrival date and time: 09/09/23 1102   Event Date/Time   First Provider Initiated Contact with Patient 09/09/23 1235      Chief Complaint  Patient presents with   Rupture of Membranes   HPI Patient is 28 y.o. [id number] [redacted]w[redacted]d here with complaints of leaking fluid, started last night and continued this AM. She recently took diflucan for a yeast infection. Last night reports soaked underwear and then this AM she was lying on her side and fluid was leaking out. Reports noticing leaking with fetal movement. She endorses some mild cramping since last night. Has a schedule IOL   +FM, denies LOF, VB, contractions, vaginal discharge.   OB History     Gravida  3   Para  2   Term  0   Preterm  2   AB  0   Living  0      SAB  0   IAB  0   Ectopic  0   Multiple  0   Live Births  1           Past Medical History:  Diagnosis Date   BV (bacterial vaginosis) 11/25/2012   Chlamydia 09/2010, 01/2011, 12/2014   Gonorrhea 09/2010, 12/2014   HSV infection    Infection    UTI   Preterm labor 07/28/2021    Past Surgical History:  Procedure Laterality Date   NO PAST SURGERIES      Family History  Problem Relation Age of Onset   Hypertension Mother    Kidney disease Father        kidney stones   Hypertension Maternal Grandfather    Diabetes Maternal Grandfather    Asthma Neg Hx    Cancer Neg Hx    Heart disease Neg Hx     Social History   Tobacco Use   Smoking status: Former    Types: Cigars    Start date: 01/27/2019    Quit date: 10/21/2020    Years since quitting: 2.8   Smokeless tobacco: Never   Tobacco comments:    1 pack a day black and milds per day  Vaping Use   Vaping status: Former  Substance Use Topics   Alcohol use: Not Currently    Comment: 3x week   Drug use: Not Currently    Types: Marijuana    Comment: last use two months ago    Allergies: No Known Allergies  Medications Prior to Admission  Medication Sig  Dispense Refill Last Dose/Taking   valACYclovir (VALTREX) 500 MG tablet Take 1 tablet (500 mg total) by mouth 2 (two) times daily. (Patient not taking: Reported on 09/08/2023) 60 tablet 2     Review of Systems  Constitutional:  Negative for chills and fever.  HENT:  Negative for congestion and sore throat.   Eyes:  Negative for pain and visual disturbance.  Respiratory:  Negative for cough, chest tightness and shortness of breath.   Cardiovascular:  Negative for chest pain.  Gastrointestinal:  Negative for abdominal pain, diarrhea, nausea and vomiting.  Endocrine: Negative for cold intolerance and heat intolerance.  Genitourinary:  Negative for dysuria and flank pain.  Musculoskeletal:  Negative for back pain.  Skin:  Negative for rash.  Allergic/Immunologic: Negative for food allergies.  Neurological:  Negative for dizziness and light-headedness.  Psychiatric/Behavioral:  Negative for agitation.    Physical Exam   Blood pressure 115/74, pulse (!) 120, temperature 98.4 F (36.9 C), temperature  source Oral, resp. rate 15, height 5\' 7"  (1.702 m), weight 93.3 kg, last menstrual period 12/04/2022, SpO2 100%, unknown if currently breastfeeding.  Physical Exam Vitals and nursing note reviewed. Exam conducted with a chaperone present.  Constitutional:      General: She is not in acute distress.    Appearance: She is well-developed.     Comments: Pregnant female  HENT:     Head: Normocephalic and atraumatic.  Eyes:     General: No scleral icterus.    Conjunctiva/sclera: Conjunctivae normal.  Cardiovascular:     Rate and Rhythm: Normal rate.  Pulmonary:     Effort: Pulmonary effort is normal.  Chest:     Chest wall: No tenderness.  Abdominal:     Palpations: Abdomen is soft.     Tenderness: There is no abdominal tenderness. There is no guarding or rebound.     Comments: Gravid  Genitourinary:    General: Normal vulva.     Vagina: Vaginal discharge (Thin white discharge) present.      Cervix: Normal.  Musculoskeletal:        General: Normal range of motion.     Cervical back: Normal range of motion and neck supple.  Skin:    General: Skin is warm and dry.     Findings: No rash.  Neurological:     Mental Status: She is alert and oriented to person, place, and time.     MAU Course  Procedures I reviewed the patient's fetal monitoring.  Baseline HR: 140 Variability:  moderate Accels:present Decels: none  A/P: Reactive NST  Reassured regarding fetal status.   MDM- Moderate  - FERN negative - ROM plus collected- NEGATIVE   Assessment and Plan   1. Vaginal discharge during pregnancy in third trimester   2. [redacted] weeks gestation of pregnancy   3. Encounter for suspected premature rupture of amniotic membranes, with rupture of membranes not found   - Discharge home - reviewed return precautions - IOL at 39 weeks  Federico Flake 09/09/2023, 1:56 PM

## 2023-09-09 NOTE — MAU Note (Signed)
.  Veronica Mclean is a 28 y.o. at 103w4d here in MAU reporting: she has been leaking some fluid since 2130 last night. Denies contractions. Reports positive fetal movement.   Onset of complaint: last pm 2130 Pain score: 0/10 There were no vitals filed for this visit.    Lab orders placed from triage: none

## 2023-09-10 ENCOUNTER — Ambulatory Visit: Payer: MEDICAID

## 2023-09-10 ENCOUNTER — Ambulatory Visit: Payer: MEDICAID | Attending: Obstetrics and Gynecology

## 2023-09-10 ENCOUNTER — Ambulatory Visit (HOSPITAL_BASED_OUTPATIENT_CLINIC_OR_DEPARTMENT_OTHER): Payer: MEDICAID | Admitting: *Deleted

## 2023-09-10 ENCOUNTER — Other Ambulatory Visit: Payer: MEDICAID

## 2023-09-10 VITALS — BP 123/65 | HR 93

## 2023-09-10 DIAGNOSIS — Z3A38 38 weeks gestation of pregnancy: Secondary | ICD-10-CM

## 2023-09-10 DIAGNOSIS — Z3493 Encounter for supervision of normal pregnancy, unspecified, third trimester: Secondary | ICD-10-CM | POA: Insufficient documentation

## 2023-09-10 DIAGNOSIS — Z8759 Personal history of other complications of pregnancy, childbirth and the puerperium: Secondary | ICD-10-CM | POA: Diagnosis not present

## 2023-09-10 DIAGNOSIS — O0990 Supervision of high risk pregnancy, unspecified, unspecified trimester: Secondary | ICD-10-CM

## 2023-09-10 NOTE — Procedures (Signed)
Veronica Mclean Oct 21, 1995 [redacted]w[redacted]d  Fetus A Non-Stress Test Interpretation for 09/10/23 (NST only)  Indication:  Hx IUFD  Fetal Heart Rate A Baseline Rate (A): 145 bpm Variability: Moderate Accelerations: 15 x 15 Decelerations: None Multiple birth?: No  Uterine Activity Mode: Palpation, Toco Contraction Frequency (min): None Resting Tone Palpated: Relaxed Resting Time: Adequate  Interpretation (Fetal Testing) Nonstress Test Interpretation: Reactive Comments: Dr. Judeth Cornfield reviewed tracing.

## 2023-09-13 ENCOUNTER — Inpatient Hospital Stay (HOSPITAL_COMMUNITY): Payer: MEDICAID

## 2023-09-14 ENCOUNTER — Other Ambulatory Visit: Payer: Self-pay

## 2023-09-14 ENCOUNTER — Encounter (HOSPITAL_COMMUNITY): Payer: Self-pay | Admitting: Family Medicine

## 2023-09-14 ENCOUNTER — Inpatient Hospital Stay (HOSPITAL_COMMUNITY)
Admission: RE | Admit: 2023-09-14 | Discharge: 2023-09-17 | DRG: 807 | Disposition: A | Discharge status: Home / Self Care | Payer: MEDICAID | Attending: Obstetrics & Gynecology | Admitting: Obstetrics & Gynecology

## 2023-09-14 DIAGNOSIS — B009 Herpesviral infection, unspecified: Secondary | ICD-10-CM | POA: Diagnosis present

## 2023-09-14 DIAGNOSIS — A6000 Herpesviral infection of urogenital system, unspecified: Secondary | ICD-10-CM | POA: Diagnosis not present

## 2023-09-14 DIAGNOSIS — Z8759 Personal history of other complications of pregnancy, childbirth and the puerperium: Principal | ICD-10-CM

## 2023-09-14 DIAGNOSIS — F419 Anxiety disorder, unspecified: Secondary | ICD-10-CM | POA: Diagnosis present

## 2023-09-14 DIAGNOSIS — O3403 Maternal care for unspecified congenital malformation of uterus, third trimester: Secondary | ICD-10-CM | POA: Diagnosis present

## 2023-09-14 DIAGNOSIS — O99824 Streptococcus B carrier state complicating childbirth: Secondary | ICD-10-CM | POA: Diagnosis present

## 2023-09-14 DIAGNOSIS — O9902 Anemia complicating childbirth: Secondary | ICD-10-CM | POA: Diagnosis present

## 2023-09-14 DIAGNOSIS — Q513 Bicornate uterus: Secondary | ICD-10-CM | POA: Diagnosis not present

## 2023-09-14 DIAGNOSIS — O26893 Other specified pregnancy related conditions, third trimester: Principal | ICD-10-CM | POA: Diagnosis present

## 2023-09-14 DIAGNOSIS — O864 Pyrexia of unknown origin following delivery: Secondary | ICD-10-CM | POA: Diagnosis not present

## 2023-09-14 DIAGNOSIS — O99345 Other mental disorders complicating the puerperium: Secondary | ICD-10-CM | POA: Diagnosis not present

## 2023-09-14 DIAGNOSIS — Z8249 Family history of ischemic heart disease and other diseases of the circulatory system: Secondary | ICD-10-CM

## 2023-09-14 DIAGNOSIS — O9832 Other infections with a predominantly sexual mode of transmission complicating childbirth: Secondary | ICD-10-CM | POA: Diagnosis present

## 2023-09-14 DIAGNOSIS — Z833 Family history of diabetes mellitus: Secondary | ICD-10-CM

## 2023-09-14 DIAGNOSIS — Z3A39 39 weeks gestation of pregnancy: Secondary | ICD-10-CM

## 2023-09-14 DIAGNOSIS — O09212 Supervision of pregnancy with history of pre-term labor, second trimester: Secondary | ICD-10-CM | POA: Diagnosis not present

## 2023-09-14 DIAGNOSIS — Z148 Genetic carrier of other disease: Secondary | ICD-10-CM | POA: Diagnosis not present

## 2023-09-14 DIAGNOSIS — O0990 Supervision of high risk pregnancy, unspecified, unspecified trimester: Secondary | ICD-10-CM

## 2023-09-14 DIAGNOSIS — O99820 Streptococcus B carrier state complicating pregnancy: Secondary | ICD-10-CM

## 2023-09-14 DIAGNOSIS — Z87891 Personal history of nicotine dependence: Secondary | ICD-10-CM | POA: Diagnosis not present

## 2023-09-14 DIAGNOSIS — O364XX0 Maternal care for intrauterine death, not applicable or unspecified: Secondary | ICD-10-CM

## 2023-09-14 LAB — CBC
HCT: 29.8 % — ABNORMAL LOW (ref 36.0–46.0)
Hemoglobin: 9.7 g/dL — ABNORMAL LOW (ref 12.0–15.0)
MCH: 25.2 pg — ABNORMAL LOW (ref 26.0–34.0)
MCHC: 32.6 g/dL (ref 30.0–36.0)
MCV: 77.4 fL — ABNORMAL LOW (ref 80.0–100.0)
Platelets: 190 10*3/uL (ref 150–400)
RBC: 3.85 MIL/uL — ABNORMAL LOW (ref 3.87–5.11)
RDW: 14.3 % (ref 11.5–15.5)
WBC: 7.5 10*3/uL (ref 4.0–10.5)
nRBC: 0 % (ref 0.0–0.2)

## 2023-09-14 LAB — TYPE AND SCREEN
ABO/RH(D): B POS
Antibody Screen: NEGATIVE

## 2023-09-14 MED ORDER — SODIUM CHLORIDE 0.9 % IV SOLN
5.0000 10*6.[IU] | Freq: Once | INTRAVENOUS | Status: AC
  Administered 2023-09-14: 5 10*6.[IU] via INTRAVENOUS
  Filled 2023-09-14: qty 5

## 2023-09-14 MED ORDER — MISOPROSTOL 50MCG HALF TABLET
50.0000 ug | ORAL_TABLET | ORAL | Status: DC | PRN
  Administered 2023-09-14 – 2023-09-15 (×3): 50 ug via ORAL
  Filled 2023-09-14 (×3): qty 1

## 2023-09-14 MED ORDER — PENICILLIN G POT IN DEXTROSE 60000 UNIT/ML IV SOLN
3.0000 10*6.[IU] | INTRAVENOUS | Status: DC
  Administered 2023-09-14 – 2023-09-15 (×6): 3 10*6.[IU] via INTRAVENOUS
  Filled 2023-09-14 (×6): qty 50

## 2023-09-14 MED ORDER — FENTANYL CITRATE (PF) 100 MCG/2ML IJ SOLN
50.0000 ug | INTRAMUSCULAR | Status: DC | PRN
  Administered 2023-09-15: 50 ug via INTRAVENOUS
  Administered 2023-09-15 (×2): 100 ug via INTRAVENOUS
  Filled 2023-09-14 (×3): qty 2

## 2023-09-14 MED ORDER — ONDANSETRON HCL 4 MG/2ML IJ SOLN
4.0000 mg | Freq: Four times a day (QID) | INTRAMUSCULAR | Status: DC | PRN
  Administered 2023-09-15 (×2): 4 mg via INTRAVENOUS
  Filled 2023-09-14 (×2): qty 2

## 2023-09-14 MED ORDER — TERBUTALINE SULFATE 1 MG/ML IJ SOLN
0.2500 mg | Freq: Once | INTRAMUSCULAR | Status: DC | PRN
  Filled 2023-09-14: qty 1

## 2023-09-14 MED ORDER — OXYTOCIN BOLUS FROM INFUSION
333.0000 mL | Freq: Once | INTRAVENOUS | Status: AC
  Administered 2023-09-15: 333 mL via INTRAVENOUS

## 2023-09-14 MED ORDER — SOD CITRATE-CITRIC ACID 500-334 MG/5ML PO SOLN: 30.0000 mL | ORAL | Status: DC | PRN

## 2023-09-14 MED ORDER — OXYTOCIN-SODIUM CHLORIDE 30-0.9 UT/500ML-% IV SOLN: 2.5000 [IU]/h | INTRAVENOUS | Status: DC

## 2023-09-14 MED ORDER — ACETAMINOPHEN 325 MG PO TABS
650.0000 mg | ORAL_TABLET | ORAL | Status: DC | PRN
  Administered 2023-09-15: 650 mg via ORAL
  Filled 2023-09-14: qty 2

## 2023-09-14 MED ORDER — LIDOCAINE HCL (PF) 1 % IJ SOLN: 30.0000 mL | INTRAMUSCULAR | Status: DC | PRN

## 2023-09-14 NOTE — Progress Notes (Signed)
 OBSTETRIC ADMISSION HISTORY AND PHYSICAL  Veronica Mclean is a 28 y.o. female G3P0200 with IUP at [redacted]w[redacted]d by 8 week [place] presenting for IOL for . She reports +FMs, No LOF, no VB, no blurry vision, headaches or peripheral edema, and RUQ pain.  She plans on breast feeding. She request POPs for birth control. She received her prenatal care at [facility]   Dating: By 8 week [place] --->  Estimated Date of Delivery: 09/19/23  Sono:   @[redacted]w[redacted]d , CWD, normal anatomy, cephalic presentation, posterior placental lie, 2955 gm 6 lb 8 oz 49 % EFW   Prenatal History/Complications:  She is currently monitored for the following issues for this high-risk pregnancy and has HSV-1 (herpes simplex virus 1) infection; Bicornate uterus; IUFD at 20 weeks or more of gestation; Bicornate uterus; Supervision of high risk pregnancy, antepartum; History of preterm labor; History of IUFD; History of preterm delivery, currently pregnant; Alpha thalassemia silent carrier; Anemia complicating pregnancy, third trimester; and GBS (group B Streptococcus carrier), +RV culture, currently pregnant on their problem list.    Past Medical History: Past Medical History:  Diagnosis Date   BV (bacterial vaginosis) 11/25/2012   Chlamydia 09/2010, 01/2011, 12/2014   Gonorrhea 09/2010, 12/2014   HSV infection    Infection    UTI   Preterm labor 07/28/2021    Past Surgical History: Past Surgical History:  Procedure Laterality Date   NO PAST SURGERIES      Obstetrical History: OB History     Gravida  3   Para  2   Term  0   Preterm  2   AB  0   Living  0      SAB  0   IAB  0   Ectopic  0   Multiple  0   Live Births  1           Social History Social History   Socioeconomic History   Marital status: Single    Spouse name: Not on file   Number of children: Not on file   Years of education: Not on file   Highest education level: Not on file  Occupational History   Not on file  Tobacco Use   Smoking status:  Former    Types: Cigars    Start date: 01/27/2019    Quit date: 10/21/2020    Years since quitting: 2.8   Smokeless tobacco: Never   Tobacco comments:    1 pack a day black and milds per day  Vaping Use   Vaping status: Former  Substance and Sexual Activity   Alcohol use: Not Currently    Comment: 3x week   Drug use: Not Currently    Types: Marijuana    Comment: last use two months ago   Sexual activity: Not Currently    Comment: 1st intercourse 16 yo-5 partners  Other Topics Concern   Not on file  Social History Narrative   [employer]   Works at [employer].      Social Drivers of Corporate investment banker Strain: Not on file  Food Insecurity: No Food Insecurity (09/14/2023)   Hunger Vital Sign    Worried About Running Out of Food in the Last Year: Never true    Ran Out of Food in the Last Year: Never true  Transportation Needs: No Transportation Needs (09/14/2023)   PRAPARE - Administrator, Civil Service (Medical): No  Lack of Transportation (Non-Medical): No  Physical Activity: Not on file  Stress: Not on file  Social Connections: Not on file    Family History: Family History  Problem Relation Age of Onset   Hypertension Mother    Kidney disease Father        kidney stones   Hypertension Maternal Grandfather    Diabetes Maternal Grandfather    Asthma Neg Hx    Cancer Neg Hx    Heart disease Neg Hx     Allergies: No Known Allergies  Medications Prior to Admission  Medication Sig Dispense Refill Last Dose/Taking   valACYclovir (VALTREX) 500 MG tablet Take 1 tablet (500 mg total) by mouth 2 (two) times daily. (Patient not taking: Reported on 09/08/2023) 60 tablet 2      Review of Systems   All systems reviewed and negative except as stated in HPI  Blood pressure 117/64, pulse 86, temperature 98.9 F (37.2 C), temperature source Oral, resp. rate 18, height 5\' 7"  (1.702 m), weight 92.1 kg, last menstrual period  12/04/2022, unknown if currently breastfeeding. General appearance: alert, cooperative, appears stated age, and no distress Lungs: clear to auscultation bilaterally Heart: regular rate and rhythm Abdomen: soft, non-tender; bowel sounds normal Pelvic: adequate, proven to 2460g Extremities: Homans sign is negative, no sign of DVT DTR's 2+ Presentation: cephalic, confirmed with bedside US OP Fetal monitoringBaseline: 140 bpm, Variability: Good {> 6 bpm), Accelerations: Reactive, and Decelerations: Absent Uterine activityFrequency: Every 7-10 minutes Dilation: 1 Effacement (%): Thick Exam by:: Dr. Leanora Cover   Prenatal labs: ABO, Rh: --/--/B POS (02/23 1842) Antibody: NEG (02/23 1842) Rubella: 9.61 (08/29 1502) RPR: Non Reactive (12/02 0853)  HBsAg: Negative (08/29 1502)  HIV: Non Reactive (12/02 0853)  GBS: Positive/-- (02/06 1149)    Lab Results  Component Value Date   GBS Positive (A) 08/28/2023   GTT 80/177/104; normal Genetic screening  LR female, SILENT CARRIER for Alpha-Thalassemia (aa/a-)  Anatomy US IUGR,   Immunization History  Administered Date(s) Administered   HPV Quadrivalent 11/03/2009   PFIZER(Purple Top)SARS-COV-2 Vaccination 10/21/2019, 02/19/2020   Td 04/16/2017   Tdap 06/30/2023    Prenatal Transfer Tool  Maternal Diabetes: No Genetic Screening: Abnormal:  Results: Other: SILENT CARRIER for Alpha-Thalassemia (aa/a-)  Maternal Ultrasounds/Referrals: Normal Fetal Ultrasounds or other Referrals:  None Maternal Substance Abuse:  No Significant Maternal Medications:  None Significant Maternal Lab Results: Group B Strep positive Number of Prenatal Visits:greater than 3 verified prenatal visits Maternal Vaccinations:TDap and Covid Other Comments:   Bicornate uterus   Results for orders placed or performed during the hospital encounter of 09/14/23 (from the past 24 hours)  CBC   Collection Time: 09/14/23  6:42 PM  Result Value Ref Range   WBC 7.5 4.0 -  10.5 K/uL   RBC 3.85 (L) 3.87 - 5.11 MIL/uL   Hemoglobin 9.7 (L) 12.0 - 15.0 g/dL   HCT 40.9 (L) 81.1 - 91.4 %   MCV 77.4 (L) 80.0 - 100.0 fL   MCH 25.2 (L) 26.0 - 34.0 pg   MCHC 32.6 30.0 - 36.0 g/dL   RDW 78.2 95.6 - 21.3 %   Platelets 190 150 - 400 K/uL   nRBC 0.0 0.0 - 0.2 %  Type and screen   Collection Time: 09/14/23  6:42 PM  Result Value Ref Range   ABO/RH(D) B POS    Antibody Screen NEG    Sample Expiration      09/17/2023,2359 Performed at [facility], [address], [place], [place] [address]     Patient Active Problem List   Diagnosis Date Noted   GBS (group B Streptococcus carrier), +RV culture, currently pregnant 09/01/2023   Anemia complicating pregnancy, third trimester 06/25/2023   History of preterm delivery, currently pregnant 04/18/2023   Alpha thalassemia silent carrier 04/18/2023   History of preterm labor 03/20/2023   History of IUFD 03/20/2023   Supervision of high risk pregnancy, antepartum 02/13/2023   Bicornate uterus 12/05/2017   IUFD at 20 weeks or more of gestation 12/03/2017   HSV-1 (herpes simplex virus 1) infection 12/02/2013    Assessment/Plan:  Dixie Coppa is a 28 y.o. G3P0200 at [redacted]w[redacted]d here for eIOL(hx of IUFD at 20 wks)  #Labor:Start with buccal cytotec. Very difficult exams.  #Pain: Pt aware of options #FWB: Cat I #GBS status:  Positive; PCN #Feeding: Breastmilk  #Reproductive Life planning: Progesterone only pills #Circ:  not applicable  HSV-1 (herpes simplex virus 1) infection No evidence of outbreak, only h/o one outbreak of HSV 1 2014.   Wyn Forster, MD  09/14/2023, 8:43 PM

## 2023-09-14 NOTE — Progress Notes (Addendum)
 Labor Progress Note Veronica Mclean is a 28 y.o. G3P0200 at [redacted]w[redacted]d presented for IOL.  S: Patient is resting comfortably in bed. S/p Buccal cytotec   O:  BP (!) 120/100   Pulse 66   Temp 98.9 F (37.2 C) (Oral)   Resp 18   Ht 5\' 7"  (1.702 m)   Wt 92.1 kg   LMP 12/04/2022 (Approximate)   BMI 31.79 kg/m  EFM: baseline 135 bpm / moderate variability / + accelerations / - decels / CTX q37mins   CVE: Dilation: 1 Effacement (%): Thick Presentation: Vertex (per Bedside US by Dr. Leanora Cover) Exam by:: Dr. Leanora Cover   A&P: 28 y.o. [id number] [redacted]w[redacted]d presented for IOL. Plan to start pitocin as indicated. #Labor: Progressing well. 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   Fortunato Curling, DO Center for [facility], [facility] 9:32 PM   Evaluation and management procedures were performed by the [facility] Medicine Resident under my supervision. I was immediately available for direct supervision, assistance and direction throughout this encounter.  I also confirm that I have verified the information documented in the resident's note, and that I have also personally reperformed the pertinent components of the physical exam and all of the medical decision making activities.  I have also made any necessary editorial changes.   Mittie Bodo, MD Family Medicine - Obstetrics Fellow

## 2023-09-15 ENCOUNTER — Encounter: Payer: MEDICAID | Admitting: Obstetrics and Gynecology

## 2023-09-15 ENCOUNTER — Inpatient Hospital Stay (HOSPITAL_COMMUNITY): Payer: MEDICAID | Admitting: Anesthesiology

## 2023-09-15 ENCOUNTER — Encounter (HOSPITAL_COMMUNITY): Payer: Self-pay | Admitting: Family Medicine

## 2023-09-15 DIAGNOSIS — O26893 Other specified pregnancy related conditions, third trimester: Secondary | ICD-10-CM | POA: Diagnosis not present

## 2023-09-15 DIAGNOSIS — O09212 Supervision of pregnancy with history of pre-term labor, second trimester: Secondary | ICD-10-CM | POA: Diagnosis not present

## 2023-09-15 DIAGNOSIS — O9902 Anemia complicating childbirth: Secondary | ICD-10-CM | POA: Diagnosis not present

## 2023-09-15 DIAGNOSIS — O99820 Streptococcus B carrier state complicating pregnancy: Secondary | ICD-10-CM | POA: Diagnosis not present

## 2023-09-15 DIAGNOSIS — O9832 Other infections with a predominantly sexual mode of transmission complicating childbirth: Secondary | ICD-10-CM | POA: Diagnosis not present

## 2023-09-15 DIAGNOSIS — O99345 Other mental disorders complicating the puerperium: Secondary | ICD-10-CM | POA: Diagnosis not present

## 2023-09-15 DIAGNOSIS — O3403 Maternal care for unspecified congenital malformation of uterus, third trimester: Secondary | ICD-10-CM | POA: Diagnosis not present

## 2023-09-15 DIAGNOSIS — A6000 Herpesviral infection of urogenital system, unspecified: Secondary | ICD-10-CM | POA: Diagnosis not present

## 2023-09-15 DIAGNOSIS — Z3A39 39 weeks gestation of pregnancy: Secondary | ICD-10-CM | POA: Diagnosis not present

## 2023-09-15 DIAGNOSIS — O99824 Streptococcus B carrier state complicating childbirth: Secondary | ICD-10-CM | POA: Diagnosis not present

## 2023-09-15 DIAGNOSIS — O864 Pyrexia of unknown origin following delivery: Secondary | ICD-10-CM | POA: Diagnosis not present

## 2023-09-15 DIAGNOSIS — Z148 Genetic carrier of other disease: Secondary | ICD-10-CM | POA: Diagnosis not present

## 2023-09-15 LAB — RPR: RPR Ser Ql: NONREACTIVE

## 2023-09-15 MED ORDER — EPHEDRINE 5 MG/ML INJ: 10.0000 mg | INTRAVENOUS | Status: DC | PRN

## 2023-09-15 MED ORDER — DIPHENHYDRAMINE HCL 50 MG/ML IJ SOLN: 12.5000 mg | INTRAMUSCULAR | Status: DC | PRN

## 2023-09-15 MED ORDER — FENTANYL-BUPIVACAINE-NACL 0.5-0.125-0.9 MG/250ML-% EP SOLN
12.0000 mL/h | EPIDURAL | Status: DC | PRN
  Administered 2023-09-15: 12 mL/h via EPIDURAL
  Filled 2023-09-15: qty 250

## 2023-09-15 MED ORDER — METOCLOPRAMIDE HCL 5 MG/ML IJ SOLN
10.0000 mg | Freq: Once | INTRAMUSCULAR | Status: AC
  Administered 2023-09-15: 10 mg via INTRAVENOUS
  Filled 2023-09-15: qty 2

## 2023-09-15 MED ORDER — PHENYLEPHRINE 80 MCG/ML (10ML) SYRINGE FOR IV PUSH (FOR BLOOD PRESSURE SUPPORT): 80.0000 ug | PREFILLED_SYRINGE | INTRAVENOUS | Status: DC | PRN

## 2023-09-15 MED ORDER — LIDOCAINE-EPINEPHRINE (PF) 1.5 %-1:200000 IJ SOLN
INTRAMUSCULAR | Status: DC | PRN
Start: 2023-09-15 — End: 2023-09-15
  Administered 2023-09-15: 5 mL via EPIDURAL

## 2023-09-15 MED ORDER — MISOPROSTOL 200 MCG PO TABS
1000.0000 ug | ORAL_TABLET | Freq: Once | ORAL | Status: AC
  Administered 2023-09-15: 1000 ug via RECTAL

## 2023-09-15 MED ORDER — LACTATED RINGERS IV SOLN: INTRAVENOUS | Status: AC

## 2023-09-15 MED ORDER — METHYLERGONOVINE MALEATE 0.2 MG/ML IJ SOLN
0.2000 mg | Freq: Once | INTRAMUSCULAR | Status: AC
  Administered 2023-09-15: 0.2 mg via INTRAMUSCULAR

## 2023-09-15 MED ORDER — METHYLERGONOVINE MALEATE 0.2 MG/ML IJ SOLN
INTRAMUSCULAR | Status: AC
Start: 2023-09-15 — End: 2023-09-16
  Filled 2023-09-15: qty 1

## 2023-09-15 MED ORDER — LACTATED RINGERS IV SOLN
500.0000 mL | Freq: Once | INTRAVENOUS | Status: AC
  Administered 2023-09-15: 500 mL via INTRAVENOUS

## 2023-09-15 MED ORDER — TRANEXAMIC ACID-NACL 1000-0.7 MG/100ML-% IV SOLN
1000.0000 mg | INTRAVENOUS | Status: AC
  Administered 2023-09-15: 1000 mg via INTRAVENOUS
  Filled 2023-09-15: qty 100

## 2023-09-15 MED ORDER — MISOPROSTOL 200 MCG PO TABS
ORAL_TABLET | ORAL | Status: AC
  Filled 2023-09-15: qty 2

## 2023-09-15 MED ORDER — TRANEXAMIC ACID-NACL 1000-0.7 MG/100ML-% IV SOLN
1000.0000 mg | Freq: Once | INTRAVENOUS | Status: AC
  Administered 2023-09-15: 1000 mg via INTRAVENOUS
  Filled 2023-09-15: qty 100

## 2023-09-15 MED ORDER — OXYTOCIN-SODIUM CHLORIDE 30-0.9 UT/500ML-% IV SOLN
1.0000 m[IU]/min | INTRAVENOUS | Status: DC
  Administered 2023-09-15 (×2): 2 m[IU]/min via INTRAVENOUS
  Filled 2023-09-15: qty 500

## 2023-09-15 MED ORDER — HYDROXYZINE HCL 25 MG PO TABS
25.0000 mg | ORAL_TABLET | Freq: Three times a day (TID) | ORAL | Status: DC | PRN
  Administered 2023-09-15: 25 mg via ORAL
  Filled 2023-09-15: qty 1

## 2023-09-15 MED ORDER — MISOPROSTOL 200 MCG PO TABS
ORAL_TABLET | ORAL | Status: AC
  Filled 2023-09-15: qty 5

## 2023-09-15 MED ORDER — TERBUTALINE SULFATE 1 MG/ML IJ SOLN
0.2500 mg | Freq: Once | INTRAMUSCULAR | Status: AC | PRN
  Administered 2023-09-15: 0.25 mg via SUBCUTANEOUS

## 2023-09-15 NOTE — Anesthesia Preprocedure Evaluation (Addendum)
 Anesthesia Evaluation  Patient identified by MRN, date of birth, ID band Patient awake    Reviewed: Allergy & Precautions, NPO status , Patient's Chart, lab work & pertinent test results  Airway Mallampati: I  TM Distance: >3 FB Neck ROM: Full    Dental no notable dental hx.    Pulmonary former smoker   Pulmonary exam normal        Cardiovascular negative cardio ROS  Rhythm:Regular Rate:Normal     Neuro/Psych negative neurological ROS  negative psych ROS   GI/Hepatic negative GI ROS, Neg liver ROS,,,  Endo/Other  negative endocrine ROS    Renal/GU negative Renal ROS  negative genitourinary   Musculoskeletal negative musculoskeletal ROS (+)    Abdominal Normal abdominal exam  (+)   Peds  Hematology  (+) Blood dyscrasia, anemia   Anesthesia Other Findings   Reproductive/Obstetrics (+) Pregnancy                              Anesthesia Physical Anesthesia Plan  ASA: 2  Anesthesia Plan: Epidural   Post-op Pain Management:    Induction:   PONV Risk Score and Plan: 2 and Treatment may vary due to age or medical condition  Airway Management Planned: Natural Airway  Additional Equipment: None  Intra-op Plan:   Post-operative Plan:   Informed Consent: I have reviewed the patients History and Physical, chart, labs and discussed the procedure including the risks, benefits and alternatives for the proposed anesthesia with the patient or authorized representative who has indicated his/her understanding and acceptance.     Dental advisory given  Plan Discussed with:   Anesthesia Plan Comments:         Anesthesia Quick Evaluation

## 2023-09-15 NOTE — Progress Notes (Signed)
 Labor Progress Note  Veronica Mclean is a 28 y.o. G3P0200 at [redacted]w[redacted]d presented for eIOL (hx of IUFD at [facility])  S: resting comfortably in bed, agreeable with spec exam and ?FB placement  O:  BP (!) 103/54   Pulse 78   Temp 98.6 F (37 C) (Oral)   Resp 18   Ht 5\' 7"  (1.702 m)   Wt 92.1 kg   LMP 12/04/2022 (Approximate)   BMI 31.79 kg/m  EFM:140bpm/Moderate variability/ 15x15 accels/ None decels CAT: 1 Toco: uterine irritability    CVE: Dilation: 1 Effacement (%): 50 Cervical Position: Posterior Station: -3 Presentation: Vertex Exam by:: Brynda Greathouse, RN   A&P: 28 y.o. G3P0200 [redacted]w[redacted]d  here for eIOL as above  #Labor: Failed Cook placement.  Will redose cytotec.  Hopefully have progressed beyond need for FB but pt agreeable to trying again later.   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

## 2023-09-15 NOTE — Progress Notes (Signed)
 Labor Progress Note  Veronica Mclean is a 28 y.o. G3P0200 at [redacted]w[redacted]d presented for eIOL (hx of IUFD at [facility])  S: resting comfortably per nursing, having more pain with CTX  O:  BP (!) 106/58   Pulse 79   Temp 98.1 F (36.7 C) (Oral)   Resp 20   Ht 5\' 7"  (1.702 m)   Wt 92.1 kg   LMP 12/04/2022 (Approximate)   BMI 31.79 kg/m  EFM:135bpm/Moderate variability/ 15x15 accels/ None decels CAT: 1 Toco: regular, every 3 minutes   CVE: Dilation: 1 Effacement (%): 50 Cervical Position: Posterior Station: -3 Presentation: Vertex Exam by:: Deland Pretty RN   A&P: 28 y.o. 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

## 2023-09-15 NOTE — Progress Notes (Signed)
 28 yo g3p0200, hx iufd and sids (so no living children) here for iol for that history, has epidural and is s/p arom about 30 minutes ago, I was called to room for repetitive late decels. Patient complaining also of ruq pain though that appears to now be resolved. 7.5 cm on exam, no cord. No sig bleeding. No prior uterine surgeries. Terb given, iupc placed. At this time decels most likely reaction to recent arom. Given resolution of pain uterine rupture/abruption less likely but will obviously monitor closely. Cat 1 tracing currently, will plan on resumption of pit in a little with mvu titration as needed.

## 2023-09-15 NOTE — Discharge Summary (Signed)
 Postpartum Discharge Summary  Date of Service updated***     Patient Name: Veronica Mclean DOB: 1996/06/27 MRN: [id number]  Date of admission: 09/14/2023 Delivery date:09/15/2023 Delivering provider: Reshard Guillet C Date of discharge: 09/15/2023  Admitting diagnosis: History of IUFD [Z87.59] Intrauterine pregnancy: [redacted]w[redacted]d     Secondary diagnosis:  Principal Problem:   History of IUFD Active Problems:   HSV-1 (herpes simplex virus 1) infection   Bicornate uterus   GBS (group B Streptococcus carrier), +RV culture, currently pregnant  Additional problems: ***    Discharge diagnosis: Term Pregnancy Delivered                                              Post partum procedures:{Postpartum procedures:23558} Augmentation: AROM, Pitocin, and Cytotec Complications: None  Hospital course: Induction of Labor With Vaginal Delivery   28 y.o. yo [id number] at [redacted]w[redacted]d was admitted to the hospital 09/14/2023 for induction of labor.  Indication for induction: Elective.  Patient had an labor course complicated by anxiety immediately prior to and following delivery. Given hx not surprising but required Atarax and Reglan shortly after delivery.  Membrane Rupture Time/Date: 2:42 PM,09/15/2023  Delivery Method:Vaginal, Spontaneous Operative Delivery:N/A Episiotomy: None Lacerations:  None Details of delivery can be found in separate delivery note.  Patient had a postpartum course complicated by***. Patient is discharged home 09/15/23.  Newborn Data: Birth date:09/15/2023 Birth time:9:23 PM Gender:Female Living status:Living Apgars:9 ,9  Weight:3720 g  Magnesium Sulfate received: {Mag received:30440022} BMZ received: No Rhophylac:N/A MMR:N/A T-DaP:Given prenatally Flu: declined RSV Vaccine received: No Transfusion:{Transfusion received:30440034}  Immunizations received: Immunization History  Administered Date(s) Administered   HPV Quadrivalent 11/03/2009   PFIZER(Purple Top)SARS-COV-2  Vaccination 10/21/2019, 02/19/2020   Td 04/16/2017   Tdap 06/30/2023    Physical exam  Vitals:   09/15/23 2002 09/15/23 2023 09/15/23 2030 09/15/23 2200  BP: (!) 142/115 (!) 133/56 (!) 118/91   Pulse:  (!) 212 (!) 171   Resp:      Temp:    99.3 F (37.4 C)  TempSrc:    Oral  SpO2:      Weight:      Height:       General: {Exam; general:21111117} Lochia: {Desc; appropriate/inappropriate:30686::"appropriate"} Uterine Fundus: {Desc; firm/soft:30687} Incision: {Exam; incision:21111123} DVT Evaluation: {Exam; dvt:2111122} Labs: Lab Results  Component Value Date   WBC 7.5 09/14/2023   HGB 9.7 (L) 09/14/2023   HCT 29.8 (L) 09/14/2023   MCV 77.4 (L) 09/14/2023   PLT 190 09/14/2023      Latest Ref Rng & Units 01/06/2023   10:48 PM  CMP  Glucose 70 - 99 mg/dL 387   BUN 6 - 20 mg/dL 12   Creatinine 5.64 - 1.00 mg/dL 3.32   Sodium 951 - 884 mmol/L 131   Potassium 3.5 - 5.1 mmol/L 3.7   Chloride 98 - 111 mmol/L 104   CO2 22 - 32 mmol/L 20   Calcium 8.9 - 10.3 mg/dL 8.1    Edinburgh Score:    07/30/2021    5:05 PM  Edinburgh Postnatal Depression Scale Screening Tool  I have been able to laugh and see the funny side of things. 0  I have looked forward with enjoyment to things. 0  I have blamed myself unnecessarily when things went wrong. 0  I have been anxious or worried for no good reason. 0  I have felt scared or panicky for no good reason. 0  Things have been getting on top of me. 0  I have been so unhappy that I have had difficulty sleeping. 0  I have felt sad or miserable. 0  I have been so unhappy that I have been crying. 0  The thought of harming myself has occurred to me. 0  Edinburgh Postnatal Depression Scale Total 0   No data recorded  After visit meds:  Allergies as of 09/15/2023   No Known Allergies   Med Rec must be completed prior to using this [facility]***        Discharge home in stable condition Infant Feeding: Breast Infant  Disposition:home with mother Discharge instruction: per After Visit Summary and Postpartum booklet. Activity: Advance as tolerated. Pelvic rest for 6 weeks.  Diet: routine diet Future Appointments: Future Appointments  Date Time Provider Department Center  09/17/2023  3:00 PM [facility] NURSE [facility]  09/17/2023  3:15 PM [facility] NST [facility]   Follow up Visit: Message to [facility] 2/24  Please schedule this patient for a In person postpartum visit in 4 weeks with the following provider: Any provider. Additional Postpartum F/U:Postpartum Depression checkup  High risk pregnancy complicated by:  HSV1 not on suppression, Hx of IUFD, Hx of child loss to SIDS Delivery mode:  Vaginal, Spontaneous Anticipated Birth Control:  POPs   09/15/2023 Hessie Dibble, MD

## 2023-09-15 NOTE — Progress Notes (Signed)
 LABOR PROGRESS NOTE  Patient Name: Veronica Mclean, female   DOB: 1996-04-20, 28 y.o.  MRN: [id number]  Patient remains very comfortable s/p epidural. Amenable to AROM. Cervix 6.5/90/-2. R/B/A of AROM discussed with patient, and verbal consent obtained. Babe's head found to be well-applied. Obtained moderate clear fluid. Mom and babe tolerated well. Cat I.  Joanne Gavel, MD

## 2023-09-15 NOTE — Progress Notes (Signed)
 LABOR PROGRESS NOTE  Patient Name: Veronica Mclean, female   DOB: [birth year], 28 y.o.  MRN: [id number]  Comfortable s/p epidural. Cervix 5.5/80/ballotable. Babe's head not well engaged, and contractions have spaced. Will start pit 2x2, then reassess for AROM after regular contractions obtained. Cat I.  Joanne Gavel, MD

## 2023-09-15 NOTE — Anesthesia Procedure Notes (Signed)
 Epidural Patient location during procedure: OB Start time: 09/15/2023 10:00 AM End time: 09/15/2023 10:05 AM  Staffing Anesthesiologist: Atilano Median, DO Performed: anesthesiologist   Preanesthetic Checklist Completed: patient identified, IV checked, site marked, risks and benefits discussed, surgical consent, monitors and equipment checked, pre-op evaluation and timeout performed  Epidural Patient position: sitting Prep: ChloraPrep Patient monitoring: heart rate, continuous pulse ox and blood pressure Approach: midline Location: L4-L5 Injection technique: LOR saline  Needle:  Needle type: Tuohy  Needle gauge: 17 G Needle length: 9 cm Needle insertion depth: 6.5 cm Catheter type: closed end flexible Catheter size: 20 Guage Catheter at skin depth: 11 cm Test dose: negative and 1.5% lidocaine with Epi 1:200 K  Assessment Events: blood not aspirated, no cerebrospinal fluid, injection not painful, no injection resistance and no paresthesia  Additional Notes Patient identified. Risks/Benefits/Options discussed with patient including but not limited to bleeding, infection, nerve damage, paralysis, failed block, incomplete pain control, headache, blood pressure changes, nausea, vomiting, reactions to medications, itching and postpartum back pain. Confirmed with bedside nurse the patient's most recent platelet count. Confirmed with patient that they are not currently taking any anticoagulation, have any bleeding history or any family history of bleeding disorders. Patient expressed understanding and wished to proceed. All questions were answered. Sterile technique was used throughout the entire procedure. Please see nursing notes for vital signs. Test dose was given through epidural catheter and negative prior to continuing to dose epidural or start infusion. Warning signs of high block given to the patient including shortness of breath, tingling/numbness in hands, complete motor  block, or any concerning symptoms with instructions to call for help. Patient was given instructions on fall risk and not to get out of bed. All questions and concerns addressed with instructions to call with any issues or inadequate analgesia.    Reason for block:procedure for pain

## 2023-09-16 LAB — CBC
HCT: 27.9 % — ABNORMAL LOW (ref 36.0–46.0)
Hemoglobin: 9.2 g/dL — ABNORMAL LOW (ref 12.0–15.0)
MCH: 24.9 pg — ABNORMAL LOW (ref 26.0–34.0)
MCHC: 33 g/dL (ref 30.0–36.0)
MCV: 75.4 fL — ABNORMAL LOW (ref 80.0–100.0)
Platelets: 176 10*3/uL (ref 150–400)
RBC: 3.7 MIL/uL — ABNORMAL LOW (ref 3.87–5.11)
RDW: 14.4 % (ref 11.5–15.5)
WBC: 15.7 10*3/uL — ABNORMAL HIGH (ref 4.0–10.5)
nRBC: 0 % (ref 0.0–0.2)

## 2023-09-16 MED ORDER — BENZOCAINE-MENTHOL 20-0.5 % EX AERO: 1.0000 | INHALATION_SPRAY | CUTANEOUS | Status: DC | PRN

## 2023-09-16 MED ORDER — ACETAMINOPHEN 325 MG PO TABS: 650.0000 mg | ORAL_TABLET | ORAL | Status: DC | PRN

## 2023-09-16 MED ORDER — DIBUCAINE (PERIANAL) 1 % EX OINT
1.0000 | TOPICAL_OINTMENT | CUTANEOUS | Status: DC | PRN
  Filled 2023-09-16: qty 28

## 2023-09-16 MED ORDER — ONDANSETRON HCL 4 MG/2ML IJ SOLN: 4.0000 mg | INTRAMUSCULAR | Status: DC | PRN

## 2023-09-16 MED ORDER — SODIUM CHLORIDE 0.9 % IV SOLN: 250.0000 mL | INTRAVENOUS | Status: DC | PRN

## 2023-09-16 MED ORDER — SIMETHICONE 80 MG PO CHEW: 80.0000 mg | CHEWABLE_TABLET | ORAL | Status: DC | PRN

## 2023-09-16 MED ORDER — COCONUT OIL OIL: 1.0000 | TOPICAL_OIL | Status: DC | PRN

## 2023-09-16 MED ORDER — PRENATAL MULTIVITAMIN CH
1.0000 | ORAL_TABLET | Freq: Every day | ORAL | Status: DC
  Administered 2023-09-16 – 2023-09-17 (×2): 1 via ORAL
  Filled 2023-09-16 (×2): qty 1

## 2023-09-16 MED ORDER — ONDANSETRON HCL 4 MG PO TABS: 4.0000 mg | ORAL_TABLET | ORAL | Status: DC | PRN

## 2023-09-16 MED ORDER — WITCH HAZEL-GLYCERIN EX PADS: 1.0000 | MEDICATED_PAD | CUTANEOUS | Status: DC | PRN

## 2023-09-16 MED ORDER — SODIUM CHLORIDE 0.9% FLUSH: 3.0000 mL | INTRAVENOUS | Status: DC | PRN

## 2023-09-16 MED ORDER — DIPHENHYDRAMINE HCL 25 MG PO CAPS: 25.0000 mg | ORAL_CAPSULE | Freq: Four times a day (QID) | ORAL | Status: DC | PRN

## 2023-09-16 MED ORDER — FERROUS SULFATE 325 (65 FE) MG PO TABS
325.0000 mg | ORAL_TABLET | ORAL | Status: DC
  Administered 2023-09-16: 325 mg via ORAL
  Filled 2023-09-16 (×2): qty 1

## 2023-09-16 MED ORDER — SODIUM CHLORIDE 0.9% FLUSH: 3.0000 mL | Freq: Two times a day (BID) | INTRAVENOUS | Status: DC

## 2023-09-16 MED ORDER — ZOLPIDEM TARTRATE 5 MG PO TABS: 5.0000 mg | ORAL_TABLET | Freq: Every evening | ORAL | Status: DC | PRN

## 2023-09-16 MED ORDER — SENNOSIDES-DOCUSATE SODIUM 8.6-50 MG PO TABS
2.0000 | ORAL_TABLET | ORAL | Status: DC
  Administered 2023-09-16: 2 via ORAL
  Filled 2023-09-16: qty 2

## 2023-09-16 MED ORDER — IBUPROFEN 600 MG PO TABS
600.0000 mg | ORAL_TABLET | Freq: Four times a day (QID) | ORAL | Status: DC
  Administered 2023-09-16 – 2023-09-17 (×5): 600 mg via ORAL
  Filled 2023-09-16 (×5): qty 1

## 2023-09-16 NOTE — Plan of Care (Addendum)
 Received a call from RN regarding elevated temp of 102.3.  Last received Tylenol while still down on L&D ~2306.  Asked nursing about abdominal exam, and was told that her abdominal exam was benign without any significant pain out of proportion.  Uterus firm and midline per RN.  Off bair hugger at this time, but still having chills.  Advised nursing to give Ibuprofen at this time and will reassess temp in 1 hour to monitor for improvement vs worsening symptoms.   Fortunato Curling, DO [facility] Family Medicine, PGY-1 09/16/23 2:50 AM

## 2023-09-16 NOTE — Progress Notes (Addendum)
 POSTPARTUM PROGRESS NOTE  Subjective: Veronica Mclean is a 28 y.o. [id number] s/p SVD at [redacted]w[redacted]d.  She reports she doing well. Overnight she fevered to 102.3 and continued to have chills, with no signs of acute abdomen, this improved s/p Ibuprofen. She denies any problems with ambulating, voiding or po intake. Denies nausea or vomiting. She has has passed flatus and had a small BM.  Pain is much more controlled.  Lochia is moderate, feels like it is more than her period. Is amenable to getting iron infusions vs oral supplementation prn.  Objective: Blood pressure 114/68, pulse 80, temperature 100.3 F (37.9 C), temperature source Oral, resp. rate 18, height 5\' 7"  (1.702 m), weight 92.1 kg, last menstrual period 12/04/2022, SpO2 97%, unknown if currently breastfeeding.  Physical Exam:  General: alert, cooperative and no distress Chest: no respiratory distress Abdomen: soft, non-tender  Uterine Fundus: firm and at level of umbilicus Extremities: No calf swelling or tenderness  No edema  Recent Labs    09/14/23 1842 09/16/23 0446  HGB 9.7* 9.2*  HCT 29.8* 27.9*    Assessment/Plan: Veronica Mclean is a 28 y.o. [id number] s/p SVD at [redacted]w[redacted]d for IOL.  Routine Postpartum Care: Doing well, pain well-controlled.  -- Continue routine care, lactation support  -- Contraception: POPs -- Feeding: BF  Dispo: Plan for discharge 1-2 days.  Fortunato Curling, DO Faculty Practice, Center for [facility] 09/16/2023 5:39 AM

## 2023-09-16 NOTE — Lactation Note (Signed)
 This note was copied from a baby's chart. Lactation Consultation Note  Patient Name: Veronica Mclean [id number] Date: 09/16/2023 Age:29 hours Reason for consult: Initial assessment;Exclusive pumping and bottle feeding;Term (hx of HSV)  P2- [name] plans to exclusively pump. [name] had bought herself a DEBP, but is requesting for a Stork pump as well. LC is unsure if [facility] will accept her insurance, but a referral has been sent today. LC set up the DEBP with size 18 mm flanges and coconut oil (per request). [name] was able to pump 1 mL of EBM in 15 minutes. LC praised [name]. LC reviewed the difference between colostrum and mature milk. LC also reviewed when she may expect her mature milk to come in. [facility] stressed the importance of pumping every 3 hrs, including at night.  LC reviewed feeding infant on cue 8-12x in 24 hrs, not allowing infant to go over 3 hrs without a feeding, CDC milk storage guidelines and LC services handout. LC encouraged [name] to call for further assistance as needed.  Maternal Data Has patient been taught Hand Expression?: No Does the patient have breastfeeding experience prior to this delivery?: Yes How long did the patient breastfeed?: 3 weeks of exclusive pumping  Feeding Mother's Current Feeding Choice: Breast Milk and Formula Nipple Type: Slow - flow  Lactation Tools Discussed/Used Tools: Pump;Flanges;Coconut oil Flange Size: 18 Breast pump type: Double-Electric Breast Pump;Manual Pump Education: Setup, frequency, and cleaning;Milk Storage Reason for Pumping: exclusive pumping Pumping frequency: 15-20 min every 3 hrs  Interventions Interventions: Breast feeding basics reviewed;Expressed milk;Coconut oil;Hand pump;DEBP;Education;LC Services brochure  Discharge Discharge Education: Warning signs for feeding baby Pump: DEBP;Personal  Consult Status Consult Status: Follow-up Date: 09/17/23 Follow-up type: In-patient    Dema Severin BS, IBCLC 09/16/2023, 4:20  PM

## 2023-09-16 NOTE — Social Work (Addendum)
 CSW received consult for hx of Anxiety and loss of baby to SIDS.  CSW met with [name] to offer support and complete assessment.  CSW entered the room and observed [name] feeding the infant. CSW introduced self, CSW role and reason for visit [name] was agreeable to visit. CSW inquired about how [name] was feeling, [name] reported good. CSW inquired about [name] MH hx, [name] reported she was seeing a therapist an was diagnosed with anxiety prior to pregnancy an she did experienced some anxiety symptoms during pregnancy. [name] reported she also experienced some anxiety during labor and she was given  hydroxyzine which helped her calm down, [name] reported she would like to look into medication to manage her anxiety symptoms. CSW assessed for safety. [name] denied any SI or HI. CSW provided education regarding the baby blues period vs. perinatal mood disorders, discussed treatment and gave resources for mental health follow up if concerns arise.  CSW recommends self-evaluation during the postpartum time period using the New Mom Checklist from Postpartum Progress and encouraged [name] to contact a medical professional if symptoms are noted at any time.    CSW provided review of Sudden Infant Death Syndrome (SIDS) precautions.  [name] reported she lost her son to SIDS in 2023. CSW expressed condolences to [name]. [name] reported she all necessary items for the infant including a bassinet and car seat.  CSW identifies no further need for intervention and no barriers to discharge at this time.  Veronica Mclean, LCSWA Clinical Social Worker [phone]

## 2023-09-16 NOTE — Anesthesia Postprocedure Evaluation (Signed)
 Anesthesia Post Note  Patient: Veronica Mclean  Procedure(s) Performed: AN AD HOC LABOR EPIDURAL     Patient location during evaluation: Mother Baby Anesthesia Type: Epidural Level of consciousness: awake and alert and oriented Pain management: satisfactory to patient Vital Signs Assessment: post-procedure vital signs reviewed and stable Respiratory status: respiratory function stable Cardiovascular status: stable Postop Assessment: no headache, no backache, epidural receding, patient able to bend at knees, no signs of nausea or vomiting, adequate PO intake and able to ambulate Anesthetic complications: no   No notable events documented.  Last Vitals:  Vitals:   09/16/23 0350 09/16/23 0559  BP:  111/76  Pulse:  81  Resp:  18  Temp: 37.9 C 37.7 C  SpO2:  97%    Last Pain:  Vitals:   09/16/23 0602  TempSrc:   PainSc: 2    Pain Goal:                   [name]

## 2023-09-17 ENCOUNTER — Other Ambulatory Visit (HOSPITAL_COMMUNITY): Payer: Self-pay

## 2023-09-17 ENCOUNTER — Ambulatory Visit: Payer: MEDICAID

## 2023-09-17 ENCOUNTER — Other Ambulatory Visit: Payer: MEDICAID

## 2023-09-17 MED ORDER — ACETAMINOPHEN 325 MG PO TABS
650.0000 mg | ORAL_TABLET | ORAL | 0 refills | Status: AC | PRN
  Filled 2023-09-17: qty 30, 3d supply, fill #0

## 2023-09-17 MED ORDER — FERROUS SULFATE 325 (65 FE) MG PO TABS
325.0000 mg | ORAL_TABLET | ORAL | 3 refills | Status: DC
  Filled 2023-09-17: qty 30, 60d supply, fill #0

## 2023-09-17 MED ORDER — HYDROCORT-PRAMOXINE (PERIANAL) 1-1 % EX FOAM
1.0000 | Freq: Three times a day (TID) | CUTANEOUS | Status: DC
  Filled 2023-09-17: qty 10

## 2023-09-17 MED ORDER — HYDROXYZINE HCL 25 MG PO TABS
25.0000 mg | ORAL_TABLET | Freq: Three times a day (TID) | ORAL | 0 refills | Status: AC | PRN
  Filled 2023-09-17: qty 30, 10d supply, fill #0

## 2023-09-17 MED ORDER — BISACODYL 10 MG RE SUPP
10.0000 mg | Freq: Once | RECTAL | Status: AC
  Administered 2023-09-17: 10 mg via RECTAL
  Filled 2023-09-17: qty 1

## 2023-09-17 MED ORDER — HYDROCORT-PRAMOXINE (PERIANAL) 1-1 % EX FOAM: 1.0000 | Freq: Three times a day (TID) | CUTANEOUS | Status: DC

## 2023-09-17 MED ORDER — FLEET ENEMA RE ENEM: 1.0000 | ENEMA | Freq: Once | RECTAL | Status: DC

## 2023-09-17 MED ORDER — SENNOSIDES-DOCUSATE SODIUM 8.6-50 MG PO TABS
2.0000 | ORAL_TABLET | Freq: Two times a day (BID) | ORAL | 0 refills | Status: DC | PRN
  Filled 2023-09-17: qty 30, 8d supply, fill #0

## 2023-09-17 MED ORDER — IBUPROFEN 600 MG PO TABS
600.0000 mg | ORAL_TABLET | Freq: Four times a day (QID) | ORAL | 0 refills | Status: AC
  Filled 2023-09-17: qty 30, 8d supply, fill #0

## 2023-09-17 NOTE — Lactation Note (Signed)
 This note was copied from a baby's chart. Lactation Consultation Note  Patient Name: Veronica Mclean [id number] Date: 09/17/2023 Age:29 hours Reason for consult: Follow-up assessment;Exclusive pumping and bottle feeding  Spoke to mother to let her know her insurance did not not cover a Stork pump but mother states she has one at home. Reviewed engorgement care and monitoring voids/stools.  Feeding Mother's Current Feeding Choice: Breast Milk and Formula Lactation Tools Discussed/Used  DEBP  Interventions Interventions: Education;DEBP  Discharge Discharge Education: Engorgement and breast care Pump: Personal;DEBP  Consult Status Consult Status: Complete Date: 09/17/23   Hardie Pulley  RN, IBCLC 09/17/2023, 3:23 PM

## 2023-09-17 NOTE — H&P (Addendum)
 Expand All Collapse All  OBSTETRIC ADMISSION HISTORY AND PHYSICAL   Veronica Mclean is a 28 y.o. female G3P0200 with IUP at [redacted]w[redacted]d by 8 week [place] presenting for IOL for . She reports +FMs, No LOF, no VB, no blurry vision, headaches or peripheral edema, and RUQ pain.  She plans on breast feeding. She request POPs for birth control. She received her prenatal care at [facility]    Dating: By 8 week [place] --->  Estimated Date of Delivery: 09/19/23   Sono:   @[redacted]w[redacted]d , CWD, normal anatomy, cephalic presentation, posterior placental lie, 2955 gm 6 lb 8 oz 49 % EFW     Prenatal History/Complications:  She is currently monitored for the following issues for this high-risk pregnancy and has HSV-1 (herpes simplex virus 1) infection; Bicornate uterus; IUFD at 20 weeks or more of gestation; Bicornate uterus; Supervision of high risk pregnancy, antepartum; History of preterm labor; History of IUFD; History of preterm delivery, currently pregnant; Alpha thalassemia silent carrier; Anemia complicating pregnancy, third trimester; and GBS (group B Streptococcus carrier), +RV culture, currently pregnant on their problem list.     Past Medical History:     Past Medical History:  Diagnosis Date   BV (bacterial vaginosis) 11/25/2012   Chlamydia 09/2010, 01/2011, 12/2014   Gonorrhea 09/2010, 12/2014   HSV infection     Infection      UTI   Preterm labor 07/28/2021          Past Surgical History:      Past Surgical History:  Procedure Laterality Date   NO PAST SURGERIES              Obstetrical History: OB History       Gravida  3   Para  2   Term  0   Preterm  2   AB  0   Living  0        SAB  0   IAB  0   Ectopic  0   Multiple  0   Live Births  1               Social History Social History         Socioeconomic History   Marital status: Single      Spouse name: Not on file   Number of children: Not on file   Years of education: Not on file   Highest education level: Not  on file  Occupational History   Not on file  Tobacco Use   Smoking status: Former      Types: Cigars      Start date: 01/27/2019      Quit date: 10/21/2020      Years since quitting: 2.8   Smokeless tobacco: Never   Tobacco comments:      1 pack a day black and milds per day  Vaping Use   Vaping status: Former  Substance and Sexual Activity   Alcohol use: Not Currently      Comment: 3x week   Drug use: Not Currently      Types: Marijuana      Comment: last use two months ago   Sexual activity: Not Currently      Comment: 1st intercourse 16 yo-5 partners  Other Topics Concern   Not on file  Social History Narrative    [employer]    Works at [employer].  Social Drivers of Acupuncturist Strain: Not on file  Food Insecurity: No Food Insecurity (09/14/2023)    Hunger Vital Sign     Worried About Running Out of Food in the Last Year: Never true     Ran Out of Food in the Last Year: Never true  Transportation Needs: No Transportation Needs (09/14/2023)    PRAPARE - Therapist, art (Medical): No     Lack of Transportation (Non-Medical): No  Physical Activity: Not on file  Stress: Not on file  Social Connections: Not on file      Family History:      Family History  Problem Relation Age of Onset   Hypertension Mother     Kidney disease Father          kidney stones   Hypertension Maternal Grandfather     Diabetes Maternal Grandfather     Asthma Neg Hx     Cancer Neg Hx     Heart disease Neg Hx            Allergies: Allergies  No Known Allergies            Medications Prior to Admission  Medication Sig Dispense Refill Last Dose/Taking   valACYclovir (VALTREX) 500 MG tablet Take 1 tablet (500 mg total) by mouth 2 (two) times daily. (Patient not taking: Reported on 09/08/2023) 60 tablet 2              Review of Systems    All systems reviewed and negative except as stated in  HPI   Blood pressure 117/64, pulse 86, temperature 98.9 F (37.2 C), temperature source Oral, resp. rate 18, height 5\' 7"  (1.702 m), weight 92.1 kg, last menstrual period 12/04/2022, unknown if currently breastfeeding. General appearance: alert, cooperative, appears stated age, and no distress Lungs: clear to auscultation bilaterally Heart: regular rate and rhythm Abdomen: soft, non-tender; bowel sounds normal Pelvic: adequate, proven to 2460g Extremities: Homans sign is negative, no sign of DVT DTR's 2+ Presentation: cephalic, confirmed with bedside US OP Fetal monitoringBaseline: 140 bpm, Variability: Good {> 6 bpm), Accelerations: Reactive, and Decelerations: Absent Uterine activityFrequency: Every 7-10 minutes Dilation: 1 Effacement (%): Thick Exam by:: Dr. Leanora Cover     Prenatal labs: ABO, Rh: --/--/B POS (02/23 1842) Antibody: NEG (02/23 1842) Rubella: 9.61 (08/29 1502) RPR: Non Reactive (12/02 0853)  HBsAg: Negative (08/29 1502)  HIV: Non Reactive (12/02 0853)  GBS: Positive/-- (02/06 1149)    Recent Labs       Lab Results  Component Value Date    GBS Positive (A) 08/28/2023      GTT 80/177/104; normal Genetic screening  LR female, SILENT CARRIER for Alpha-Thalassemia (aa/a-)  Anatomy US IUGR,        Immunization History  Administered Date(s) Administered   HPV Quadrivalent 11/03/2009   PFIZER(Purple Top)SARS-COV-2 Vaccination 10/21/2019, 02/19/2020   Td 04/16/2017   Tdap 06/30/2023      Prenatal Transfer Tool  Maternal Diabetes: No Genetic Screening: Abnormal:  Results: Other: SILENT CARRIER for Alpha-Thalassemia (aa/a-)  Maternal Ultrasounds/Referrals: Normal Fetal Ultrasounds or other Referrals:  None Maternal Substance Abuse:  No Significant Maternal Medications:  None Significant Maternal Lab Results: Group B Strep positive Number of Prenatal Visits:greater than 3 verified prenatal visits Maternal Vaccinations:TDap and Covid Other Comments:    Bicornate uterus           Results for orders placed or performed  during the hospital encounter of 09/14/23 (from the past 24 hours)  CBC    Collection Time: 09/14/23  6:42 PM  Result Value Ref Range    WBC 7.5 4.0 - 10.5 K/uL    RBC 3.85 (L) 3.87 - 5.11 MIL/uL    Hemoglobin 9.7 (L) 12.0 - 15.0 g/dL    HCT 16.1 (L) 09.6 - 46.0 %    MCV 77.4 (L) 80.0 - 100.0 fL    MCH 25.2 (L) 26.0 - 34.0 pg    MCHC 32.6 30.0 - 36.0 g/dL    RDW 04.5 40.9 - 81.1 %    Platelets 190 150 - 400 K/uL    nRBC 0.0 0.0 - 0.2 %  Type and screen    Collection Time: 09/14/23  6:42 PM  Result Value Ref Range    ABO/RH(D) B POS      Antibody Screen NEG      Sample Expiration          09/17/2023,2359 Performed at [facility], [address], [place], [place] [address]            Patient Active Problem List    Diagnosis Date Noted   GBS (group B Streptococcus carrier), +RV culture, currently pregnant 09/01/2023   Anemia complicating pregnancy, third trimester 06/25/2023   History of preterm delivery, currently pregnant 04/18/2023   Alpha thalassemia silent carrier 04/18/2023   History of preterm labor 03/20/2023   History of IUFD 03/20/2023   Supervision of high risk pregnancy, antepartum 02/13/2023   Bicornate uterus 12/05/2017   IUFD at 20 weeks or more of gestation 12/03/2017   HSV-1 (herpes simplex virus 1) infection 12/02/2013      Assessment/Plan:  Veronica Mclean is a 28 y.o. G3P0200 at [redacted]w[redacted]d here for eIOL(hx of IUFD at 20 wks)   #Labor:Start with buccal cytotec. Very difficult exams.  #Pain:  Pt aware of options #FWB: Cat I #GBS status:   Positive; PCN #Feeding: Breastmilk  #Reproductive Life planning: Progesterone only pills #Circ:   not applicable   HSV-1 (herpes simplex virus 1) infection No evidence of outbreak, only h/o one outbreak of HSV 1 2014.    Wyn Forster, MD  09/14/2023, 8:43 PM     Copied from incorrect note type used on admission.

## 2023-09-18 ENCOUNTER — Encounter: Payer: Self-pay | Admitting: Adult Health

## 2023-09-24 ENCOUNTER — Telehealth (HOSPITAL_COMMUNITY): Payer: Self-pay | Admitting: *Deleted

## 2023-09-24 DIAGNOSIS — Z1331 Encounter for screening for depression: Secondary | ICD-10-CM

## 2023-09-24 NOTE — Telephone Encounter (Signed)
 09/24/2023  Name: Veronica Mclean MRN: [id number] DOB: 08-05-1995  Reason for Call:  Transition of Care Hospital Discharge Call  Contact Status: Patient Contact Status: Complete  Language assistant needed: Interpreter Mode: Interpreter Not Needed        Follow-Up Questions: Do You Have Any Concerns About Your Health As You Heal From Delivery?: Yes What Concerns Do You Have About Your Health?: She has had headaches and swelling in her LEs.  She does not have a BP cuff at home and says she has not been able to find a drug store with an automatic cuff.  She reports having called her OB office and was told to go to the hospital.  She says the swelling is decreasing.  Denies RUQ pain, swelling in hands or face, blurred vision, or seeing spots.  Encouraged patient to try calling OB office once more, and if still unable to go in for BP check - advised to come to [facility]U. Do You Have Any Concerns About Your Infants Health?: No  Edinburgh Postnatal Depression Scale:  In the Past 7 Days:    PHQ2-9 Depression Scale:     Discharge Follow-up: Edinburgh score requires follow up?:  (declined screening, requested IBH referral for anxiety and grief due to loss of son in 2023) Patient was advised of the following resources:: Support Group  Post-discharge interventions: Reviewed Newborn Safe Sleep Practices Maternal Mental Health Resources provided Placed [facility] referral in patient's chart  Salena Saner, RN 09/24/2023 10:20

## 2023-09-25 ENCOUNTER — Telehealth: Payer: Self-pay | Admitting: Lactation Services

## 2023-09-25 NOTE — Telephone Encounter (Signed)
 Called patient in response to concerns post partum. Patient with concerns with feet swelling, passing clots and HA. Patient did not answer. LM for her to please call the office back and to check her Mychart messages.

## 2023-10-07 NOTE — BH Specialist Note (Signed)
 Pt did not arrive to video visit and did not answer the phone ; Unable to leave voicemail; left MyChart message for patient.

## 2023-10-15 ENCOUNTER — Ambulatory Visit: Payer: MEDICAID | Admitting: Obstetrics and Gynecology

## 2023-10-17 ENCOUNTER — Ambulatory Visit: Payer: MEDICAID | Admitting: Clinical

## 2023-10-17 DIAGNOSIS — Z91199 Patient's noncompliance with other medical treatment and regimen due to unspecified reason: Secondary | ICD-10-CM

## 2024-03-08 ENCOUNTER — Encounter (HOSPITAL_BASED_OUTPATIENT_CLINIC_OR_DEPARTMENT_OTHER): Payer: Self-pay | Admitting: Emergency Medicine

## 2024-03-08 ENCOUNTER — Other Ambulatory Visit: Payer: Self-pay

## 2024-03-08 ENCOUNTER — Emergency Department (HOSPITAL_BASED_OUTPATIENT_CLINIC_OR_DEPARTMENT_OTHER): Payer: MEDICAID

## 2024-03-08 ENCOUNTER — Emergency Department (HOSPITAL_BASED_OUTPATIENT_CLINIC_OR_DEPARTMENT_OTHER)
Admission: EM | Admit: 2024-03-08 | Discharge: 2024-03-08 | Disposition: A | Discharge status: Home / Self Care | Payer: MEDICAID | Attending: Emergency Medicine | Admitting: Emergency Medicine

## 2024-03-08 DIAGNOSIS — M25572 Pain in left ankle and joints of left foot: Secondary | ICD-10-CM | POA: Diagnosis not present

## 2024-03-08 DIAGNOSIS — W1840XA Slipping, tripping and stumbling without falling, unspecified, initial encounter: Secondary | ICD-10-CM | POA: Insufficient documentation

## 2024-03-08 DIAGNOSIS — Y9341 Activity, dancing: Secondary | ICD-10-CM | POA: Diagnosis not present

## 2024-03-08 DIAGNOSIS — Z87891 Personal history of nicotine dependence: Secondary | ICD-10-CM | POA: Insufficient documentation

## 2024-03-08 DIAGNOSIS — G8911 Acute pain due to trauma: Secondary | ICD-10-CM | POA: Insufficient documentation

## 2024-03-08 DIAGNOSIS — M7989 Other specified soft tissue disorders: Secondary | ICD-10-CM | POA: Diagnosis not present

## 2024-03-08 MED ORDER — HYDROCODONE-ACETAMINOPHEN 5-325 MG PO TABS
1.0000 | ORAL_TABLET | Freq: Once | ORAL | Status: AC
  Administered 2024-03-08: 1 via ORAL
  Filled 2024-03-08: qty 1

## 2024-03-08 MED ORDER — MELOXICAM 7.5 MG PO TABS: 7.5000 mg | ORAL_TABLET | Freq: Every day | ORAL | 0 refills | Status: DC

## 2024-03-08 NOTE — ED Triage Notes (Signed)
 Fell off heels and hurt left ankle now more swollen and bruised and painful

## 2024-03-08 NOTE — Discharge Instructions (Signed)
 Thank you for letting us  evaluate you today.  I have sent anti-inflammatory medication similar to ibuprofen  to your pharmacy.  Please continue to rest, ice, elevate foot to reduce swelling, further injury.  Please continue using cam boot, crutches to avoid further injury.  Have also provided you with orthopedic follow-up for further management  Return to Emergency Department if you experience significant worsening symptoms, inability to feel foot, cool pale extremity to indicate poor perfusion

## 2024-03-08 NOTE — ED Provider Notes (Signed)
 [place] EMERGENCY DEPARTMENT AT [facility] [place] Provider Note   CSN: [id number] Arrival date & time: 03/08/24  1458     Patient presents with: Ankle Pain   Veronica Mclean is a 28 y.o. female presents to the emergency department for evaluation of left ankle pain and swelling following tripping on her heels while dancing today.  Denies paresthesia, fevers, complaints prior to today   Ankle Pain     Prior to Admission medications   Medication Sig Start Date End Date Taking? Authorizing Provider  meloxicam  (MOBIC ) 7.5 MG tablet Take 1 tablet (7.5 mg total) by mouth daily. 03/08/24  Yes Minnie Tinnie BRAVO, PA  acetaminophen  (TYLENOL ) 325 MG tablet Take 2 tablets (650 mg total) by mouth every 4 (four) hours as needed (for pain scale < 4). 09/17/23   Jhonny Augustin BROCKS, MD  ferrous sulfate  325 (65 FE) MG tablet Take 1 tablet (325 mg total) by mouth every other day. 09/18/23   Jhonny Augustin BROCKS, MD  hydrocortisone -pramoxine (PROCTOFOAM -HC) rectal foam Place 1 applicator rectally 3 (three) times daily. 09/17/23   Smith, [place] , CNM  hydrOXYzine  (ATARAX ) 25 MG tablet Take 1 tablet (25 mg total) by mouth 3 (three) times daily as needed for anxiety. 09/17/23   Jhonny Augustin BROCKS, MD  ibuprofen  (ADVIL ) 600 MG tablet Take 1 tablet (600 mg total) by mouth every 6 (six) hours. 09/17/23   Jhonny Augustin BROCKS, MD  senna-docusate (SENOKOT-S) 8.6-50 MG tablet Take 2 tablets by mouth 2 (two) times daily as needed for mild constipation. 09/17/23   Jhonny Augustin BROCKS, MD  valACYclovir  (VALTREX ) 500 MG tablet Take 1 tablet (500 mg total) by mouth 2 (two) times daily. Patient not taking: Reported on 09/08/2023 08/28/23   Lola Donnice HERO, MD    Allergies: Patient has no known allergies.    Review of Systems  Musculoskeletal:  Positive for joint swelling.    Updated Vital Signs BP 116/68 (BP Location: Left Arm)   Pulse 73   Temp 98.1 F (36.7 C) (Oral)   Resp 14   Ht 5' 7 (1.702 m)   Wt 83 kg   SpO2 100%    BMI 28.65 kg/m   Physical Exam Vitals and nursing note reviewed.  Constitutional:      General: She is not in acute distress.    Appearance: Normal appearance.  HENT:     Head: Normocephalic and atraumatic.  Eyes:     Conjunctiva/sclera: Conjunctivae normal.  Cardiovascular:     Rate and Rhythm: Normal rate.  Pulmonary:     Effort: Pulmonary effort is normal. No respiratory distress.  Musculoskeletal:     Comments: mild swelling and tenderness to lateral aspect of left ankle and left foot.  Sensation 2/2 of BLE.  DP 2+ equally.  ROM limited secondary to pain.  No warmth, erythema, streaking to left ankle or foot.  ROM of left knee, left hip WNL with no tenderness nor swelling  Skin:    Coloration: Skin is not jaundiced or pale.  Neurological:     Mental Status: She is alert and oriented to person, place, and time. Mental status is at baseline.     (all labs ordered are listed, but only abnormal results are displayed) Labs Reviewed - No data to display  EKG: None  Radiology: DG Ankle Complete Left Result Date: 03/08/2024 CLINICAL DATA:  fell, left ankle pain and swelling EXAM: LEFT ANKLE COMPLETE - 3+ VIEW COMPARISON:  None Available. FINDINGS: No acute fracture or  dislocation. No ankle mortise widening. The talar dome is intact. There is no evidence of arthropathy or other focal bone abnormality. Soft tissues are unremarkable. IMPRESSION: No acute fracture or dislocation. Electronically Signed   By: Rogelia Myers M.D.   On: 03/08/2024 16:20    Medications Ordered in the ED  HYDROcodone -acetaminophen  (NORCO/VICODIN) 5-325 MG per tablet 1 tablet (1 tablet Oral Given 03/08/24 1722)                                    Medical Decision Making Amount and/or Complexity of Data Reviewed Radiology: ordered.  Risk Prescription drug management.   Patient presents to the ED for concern of left ankle pain, this involves an extensive number of treatment options, and is a  complaint that carries with it a high risk of complications and morbidity.  The differential diagnosis includes fracture, contusion, dislocation, infection   Co morbidities that complicate the patient evaluation  None   Additional history obtained:  Additional history obtained from  Nursing   External records from outside source obtained and reviewed including triage note    Imaging Studies ordered:  I ordered imaging studies including left ankle x-ray I independently visualized and interpreted imaging which showed no traumatic injury I agree with the radiologist interpretation   Medicines ordered and prescription drug management:  I ordered medication including Norco, meloxicam  for pain Reevaluation of the patient after these medicines showed that the patient improved I have reviewed the patients home medicines and have made adjustments as needed     Problem List / ED Course:  Left ankle pain Likely strain/sprain and may have sustained ligamentous injury No other injuries found on physical exam nor reported by patient Low suspicion for infection, cellulitis, septic joint with no signs of infection X-ray fortunately without signs of fracture nor dislocation Provided analgesia in the emergency department as well as prescription for meloxicam . Discussed symptomatic treatment at home to include RICE Provided cam boot, crutches, orthopedic follow-up   Reevaluation:  After the interventions noted above, I reevaluated the patient and found that they have :improved   Social Determinants of Health:  Former tobacco abuse   Dispostion:  After consideration of the diagnostic results and the patients response to treatment, I feel that the patent would benefit from outpatient management with symptomatic care and orthopedic follow-up.   Discussed ED workup, disposition, return to ED precautions with patient who expresses understanding agrees with plan.  All questions  answered to their satisfaction.  They are agreeable to plan.  Discharge instructions provided on paperwork  Final diagnoses:  Acute left ankle pain    ED Discharge Orders          Ordered    meloxicam  (MOBIC ) 7.5 MG tablet  Daily        03/08/24 1718             Minnie Tinnie BRAVO, PA 03/08/24 1730    Ruthe Cornet, DO 03/08/24 1853

## 2024-05-30 ENCOUNTER — Other Ambulatory Visit: Payer: Self-pay

## 2024-05-30 ENCOUNTER — Encounter: Payer: Self-pay | Admitting: Intensive Care

## 2024-05-30 ENCOUNTER — Emergency Department
Admission: EM | Admit: 2024-05-30 | Discharge: 2024-05-30 | Disposition: A | Discharge status: Home / Self Care | Payer: MEDICAID

## 2024-05-30 ENCOUNTER — Emergency Department: Payer: MEDICAID

## 2024-05-30 DIAGNOSIS — N939 Abnormal uterine and vaginal bleeding, unspecified: Secondary | ICD-10-CM | POA: Insufficient documentation

## 2024-05-30 DIAGNOSIS — Q5128 Other doubling of uterus, other specified: Secondary | ICD-10-CM | POA: Diagnosis not present

## 2024-05-30 DIAGNOSIS — R1030 Lower abdominal pain, unspecified: Secondary | ICD-10-CM | POA: Diagnosis not present

## 2024-05-30 DIAGNOSIS — R102 Pelvic and perineal pain unspecified side: Secondary | ICD-10-CM | POA: Diagnosis not present

## 2024-05-30 DIAGNOSIS — R1024 Suprapubic pain: Secondary | ICD-10-CM | POA: Diagnosis not present

## 2024-05-30 LAB — COMPREHENSIVE METABOLIC PANEL WITH GFR
ALT: 19 U/L (ref 0–44)
AST: 23 U/L (ref 15–41)
Albumin: 3.9 g/dL (ref 3.5–5.0)
Alkaline Phosphatase: 52 U/L (ref 38–126)
Anion gap: 14 (ref 5–15)
BUN: 11 mg/dL (ref 6–20)
CO2: 20 mmol/L — ABNORMAL LOW (ref 22–32)
Calcium: 9 mg/dL (ref 8.9–10.3)
Chloride: 106 mmol/L (ref 98–111)
Creatinine, Ser: 0.71 mg/dL (ref 0.44–1.00)
GFR, Estimated: >60 mL/min (ref >60)
Glucose, Bld: 115 mg/dL — ABNORMAL HIGH (ref 70–99)
Potassium: 3.5 mmol/L (ref 3.5–5.1)
Sodium: 140 mmol/L (ref 135–145)
Total Bilirubin: 0.6 mg/dL (ref 0.0–1.2)
Total Protein: 8 g/dL (ref 6.5–8.1)

## 2024-05-30 LAB — CBC WITH DIFFERENTIAL/PLATELET
Abs Immature Granulocytes: 0.02 K/uL (ref 0.00–0.07)
Basophils Absolute: 0 K/uL (ref 0.0–0.1)
Basophils Relative: 0 %
Eosinophils Absolute: 0 K/uL (ref 0.0–0.5)
Eosinophils Relative: 1 %
HCT: 34.2 % — ABNORMAL LOW (ref 36.0–46.0)
Hemoglobin: 11 g/dL — ABNORMAL LOW (ref 12.0–15.0)
Immature Granulocytes: 0 %
Lymphocytes Relative: 8 %
Lymphs Abs: 0.5 K/uL — ABNORMAL LOW (ref 0.7–4.0)
MCH: 24.1 pg — ABNORMAL LOW (ref 26.0–34.0)
MCHC: 32.2 g/dL (ref 30.0–36.0)
MCV: 75 fL — ABNORMAL LOW (ref 80.0–100.0)
Monocytes Absolute: 0.4 K/uL (ref 0.1–1.0)
Monocytes Relative: 6 %
Neutro Abs: 5.4 K/uL (ref 1.7–7.7)
Neutrophils Relative %: 85 %
Platelets: 174 K/uL (ref 150–400)
RBC: 4.56 MIL/uL (ref 3.87–5.11)
RDW: 17.3 % — ABNORMAL HIGH (ref 11.5–15.5)
Smear Review: NORMAL
WBC: 6.3 K/uL (ref 4.0–10.5)
nRBC: 0 % (ref 0.0–0.2)

## 2024-05-30 LAB — URINE DRUG SCREEN, QUALITATIVE (ARMC ONLY)
Amphetamines, Ur Screen: NOT DETECTED
Barbiturates, Ur Screen: NOT DETECTED
Benzodiazepine, Ur Scrn: NOT DETECTED
Cannabinoid 50 Ng, Ur ~~LOC~~: POSITIVE — AB
Cocaine Metabolite,Ur ~~LOC~~: NOT DETECTED
MDMA (Ecstasy)Ur Screen: NOT DETECTED
Methadone Scn, Ur: NOT DETECTED
Opiate, Ur Screen: POSITIVE — AB
Phencyclidine (PCP) Ur S: NOT DETECTED
Tricyclic, Ur Screen: NOT DETECTED

## 2024-05-30 LAB — URINALYSIS, COMPLETE (UACMP) WITH MICROSCOPIC
Bacteria, UA: NONE SEEN
Bilirubin Urine: NEGATIVE
Glucose, UA: NEGATIVE mg/dL
Ketones, ur: 20 mg/dL — AB
Nitrite: NEGATIVE
Protein, ur: NEGATIVE mg/dL
RBC / HPF: >50 RBC/hpf (ref 0–5)
Specific Gravity, Urine: 1.017 (ref 1.005–1.030)
pH: 7 (ref 5.0–8.0)

## 2024-05-30 LAB — LIPASE, BLOOD: Lipase: 31 U/L (ref 11–51)

## 2024-05-30 LAB — HCG, QUANTITATIVE, PREGNANCY: hCG, Beta Chain, Quant, S: <1 m[IU]/mL (ref <5)

## 2024-05-30 LAB — LACTIC ACID, PLASMA
Lactic Acid, Venous: 2.8 mmol/L (ref 0.5–1.9)
Lactic Acid, Venous: 1.2 mmol/L (ref 0.5–1.9)

## 2024-05-30 MED ORDER — ONDANSETRON HCL 4 MG/2ML IJ SOLN
4.0000 mg | Freq: Once | INTRAMUSCULAR | Status: AC
  Administered 2024-05-30: 4 mg via INTRAVENOUS
  Filled 2024-05-30: qty 2

## 2024-05-30 MED ORDER — SODIUM CHLORIDE 0.9 % IV BOLUS
1000.0000 mL | Freq: Once | INTRAVENOUS | Status: AC
  Administered 2024-05-30: 1000 mL via INTRAVENOUS

## 2024-05-30 MED ORDER — MORPHINE SULFATE (PF) 4 MG/ML IV SOLN
4.0000 mg | Freq: Once | INTRAVENOUS | Status: AC
  Administered 2024-05-30: 4 mg via INTRAVENOUS
  Filled 2024-05-30: qty 1

## 2024-05-30 NOTE — ED Triage Notes (Addendum)
 Patient arrived by [facility] from home for vaginal bleeding X2 days. Reports blood clots and missed period at end of October. Patient unsure if possible pregnancy. Also c.o diarrhea and vomiting. Reports pelvic cramping  Initial 92/60 b/p, 200 NS given by EMS and 111/84 b/p 67HR 110CBG  EMS administered 4mg  zofran 

## 2024-05-30 NOTE — Discharge Instructions (Signed)

## 2024-05-30 NOTE — ED Provider Notes (Signed)
 [facility] Provider Note    Event Date/Time   First MD Initiated Contact with Patient 05/30/24 1211     (approximate)   History   Vaginal Bleeding   HPI  Veronica Mclean is a 28 y.o. female with a past medical history of bicornate uterus who presents acutely via EMS with suprapubic left-sided abdominal pain and ongoing vaginal bleeding.  Patient does not know whether she is a pregnant or not.  Denies any concern for STDs.  History is limited as patient is screaming upon arrival holding abdomen      Physical Exam   Triage Vital Signs: ED Triage Vitals  Encounter Vitals Group     BP 05/30/24 1207 125/77     Girls Systolic BP Percentile --      Girls Diastolic BP Percentile --      Boys Systolic BP Percentile --      Boys Diastolic BP Percentile --      Pulse Rate 05/30/24 1207 71     Resp 05/30/24 1207 20     Temp 05/30/24 1207 98.8 F (37.1 C)     Temp Source 05/30/24 1207 Oral     SpO2 05/30/24 1207 100 %     Weight 05/30/24 1208 150 lb (68 kg)     Height 05/30/24 1208 5' 7 (1.702 m)     Head Circumference --      Peak Flow --      Pain Score --      Pain Loc --      Pain Education --      Exclude from Growth Chart --     Most recent vital signs: Vitals:   05/30/24 1207 05/30/24 1250  BP: 125/77   Pulse: 71   Resp: 20   Temp: 98.8 F (37.1 C)   SpO2: 100% 100%    Nursing Triage Note reviewed. Vital signs reviewed and patients oxygen saturation is normoxic  General: Patient is well nourished, well developed, awake and alert, appears in distress Head: Normocephalic and atraumatic Eyes: Normal inspection, extraocular muscles intact, no conjunctival pallor Ear, nose, throat: Normal external exam Neck: Normal range of motion Respiratory: Patient is in no respiratory distress, lungs CTAB Cardiovascular: Patient is not tachycardic, RRR without murmur appreciated GI: Abd SNT with no guarding or rebound, pain is not  replicated by palpation  Back: Normal inspection of the back with good strength and range of motion throughout all ext Extremities: pulses intact with good cap refills, no LE pitting edema or calf tenderness Neuro: The patient is alert and oriented to person, place, and time, appropriately conversive, with 5/5 bilat UE/LE strength, no gross motor or sensory defects noted. Coordination appears to be adequate. Skin: Warm, dry, and intact Psych: Agitated  ED Results / Procedures / Treatments   Labs (all labs ordered are listed, but only abnormal results are displayed) Labs Reviewed  CBC WITH DIFFERENTIAL/PLATELET - Abnormal; Notable for the following components:      Result Value   Hemoglobin 11.0 (*)    HCT 34.2 (*)    MCV 75.0 (*)    MCH 24.1 (*)    RDW 17.3 (*)    Lymphs Abs 0.5 (*)    All other components within normal limits  COMPREHENSIVE METABOLIC PANEL WITH GFR - Abnormal; Notable for the following components:   CO2 20 (*)    Glucose, Bld 115 (*)    All other components within normal limits  URINALYSIS, COMPLETE (UACMP)  WITH MICROSCOPIC - Abnormal; Notable for the following components:   Color, Urine YELLOW (*)    APPearance HAZY (*)    Hgb urine dipstick LARGE (*)    Ketones, ur 20 (*)    Leukocytes,Ua TRACE (*)    All other components within normal limits  URINE DRUG SCREEN, QUALITATIVE ([facility] ONLY) - Abnormal; Notable for the following components:   Opiate, Ur Screen POSITIVE (*)    Cannabinoid 50 Ng, Ur [place] POSITIVE (*)    All other components within normal limits  LACTIC ACID, PLASMA - Abnormal; Notable for the following components:   Lactic Acid, Venous 2.8 (*)    All other components within normal limits  HCG, QUANTITATIVE, PREGNANCY  LIPASE, BLOOD  LACTIC ACID, PLASMA  POC URINE PREG, ED     EKG None  RADIOLOGY US  Pelvis with doppler: Good flow to both ovaries with no lesions and ovaries and bicornate uterus with thickened endometrium on my independent  review interpretation radiologist agrees    PROCEDURES:  Critical Care performed: No  Procedures   MEDICATIONS ORDERED IN ED: Medications  sodium chloride  0.9 % bolus 1,000 mL (0 mLs Intravenous Stopped 05/30/24 1514)  ondansetron  (ZOFRAN ) injection 4 mg (4 mg Intravenous Given 05/30/24 1235)  morphine  (PF) 4 MG/ML injection 4 mg (4 mg Intravenous Given 05/30/24 1232)     IMPRESSION / MDM / ASSESSMENT AND PLAN / ED COURSE                                Differential diagnosis includes, but is not limited to: Ovarian torsion, ectopic pregnancy, miscarriage, electrolyte derangement, anemia, TOA  ED course: Patient presents acutely, screaming in pain and history is limited.  She is given IV morphine  Zofran  and normal saline with good resolve of her symptoms.  Repeat abdominal exam is completely benign.  She denies any concern for STDs and does not want a pelvic exam or testing today.  Her hCG was not elevated.  Urinalysis was not consistent with infection and she had no acute anemia requiring transfusion or elevated intra-abdominal labs including LFTs or pancreas.  She had no leukocytosis.  Lactic acid was not elevated.  Pelvic ultrasound was performed and there was no concerning lesions for intermittent ovarian torsion.  Patient was counseled regarding the thickened endometrium and advised to follow-up with OB/GYN as soon as possible.  On repeat exam patient's abdomen completely benign and she was able to tolerate p.o. and she requested discharge   Clinical Course as of 05/30/24 2038  [place] May 30, 2024  1215 Unfortunately patient states that there may be a chance that she is pregnant [HD]  1215 History is very limited as EMS did not stay to tell us  about the patient and patient is screaming and yelling and states that she has abdominal pain and vaginal bleeding but is unable to tell me much more [HD]  1222 Patient is verbally aggressive yelling curse words throughout.  Walked out to doc  bubble/nursing station verbally aggressive and yelling out for phone.  Of note patient has been here less than 5 minutes in the room and asscessed.  [HD]  1315 HCG, Beta Chain, Quant, S: <1 Not elevated [HD]  1316 RBC: 4.56 [HD]  1316 HCT(!): 34.2 Blood work better than previous [HD]  1353 WBC: 6.3 No leukocytosis [HD]  1354 Comprehensive metabolic panel(!) No electrolyte derangements [HD]  1354 Urinalysis, Complete w Microscopic -Urine, Clean CatchROLLEN)  Does not seem consistent with UTI [HD]  1503 Patient reevaluated.  Well-appearing.  Repeat abdominal exam benign.  Counseled her on ultrasound results and the need to follow-up with OB/GYN she voiced understanding.  Patient does have a significant other at bedside and all feel comfortable returning home [HD]    Clinical Course User Index [HD] Nicholaus Rolland BRAVO, MD   At time of discharge there is no evidence of acute life, limb, vision, or fertility threat. Patient has stable vital signs, pain is well controlled, patient is ambulatory and p.o. tolerant.  Discharge instructions were completed using the EPIC system. I would refer you to those at this time. All warnings prescriptions follow-up etc. were discussed in detail with the patient. Patient indicates understanding and is agreeable with this plan. All questions answered.  Patient is made aware that they may return to the emergency department for any worsening or new condition or for any other emergency.  -- Risk: 5 This patient has a high risk of morbidity due to further diagnostic testing or treatment. Rationale: This patient's evaluation and management involve a high risk of morbidity due to the potential severity of presenting symptoms, need for diagnostic testing, and/or initiation of treatment that may require close monitoring. The differential includes conditions with potential for significant deterioration or requiring escalation of care. Treatment decisions in the ED, including  medication administration, procedural interventions, or disposition planning, reflect this level of risk. COPA: 5 The patient has the following acute or chronic illness/injury that poses a possible threat to life or bodily function: [X] : The patient has a potentially serious acute condition or an acute exacerbation of a chronic illness requiring urgent evaluation and management in the Emergency Department. The clinical presentation necessitates immediate consideration of life-threatening or function-threatening diagnoses, even if they are ultimately ruled out.   FINAL CLINICAL IMPRESSION(S) / ED DIAGNOSES   Final diagnoses:  Vaginal bleeding  Suprapubic pain     Rx / DC Orders   ED Discharge Orders     None        Note:  This document was prepared using Dragon voice recognition software and may include unintentional dictation errors.   Nicholaus Rolland BRAVO, MD 05/30/24 2038

## 2024-06-24 ENCOUNTER — Ambulatory Visit (INDEPENDENT_AMBULATORY_CARE_PROVIDER_SITE_OTHER): Payer: MEDICAID | Admitting: Nurse Practitioner

## 2024-06-24 ENCOUNTER — Encounter: Payer: Self-pay | Admitting: Nurse Practitioner

## 2024-06-24 VITALS — BP 114/74 | HR 74 | Wt 162.0 lb

## 2024-06-24 DIAGNOSIS — N939 Abnormal uterine and vaginal bleeding, unspecified: Secondary | ICD-10-CM

## 2024-06-24 DIAGNOSIS — N930 Postcoital and contact bleeding: Secondary | ICD-10-CM

## 2024-06-24 DIAGNOSIS — Z113 Encounter for screening for infections with a predominantly sexual mode of transmission: Secondary | ICD-10-CM

## 2024-06-24 DIAGNOSIS — B3731 Acute candidiasis of vulva and vagina: Secondary | ICD-10-CM | POA: Diagnosis not present

## 2024-06-24 DIAGNOSIS — N946 Dysmenorrhea, unspecified: Secondary | ICD-10-CM

## 2024-06-24 LAB — PREGNANCY, URINE: Preg Test, Ur: NEGATIVE

## 2024-06-24 MED ORDER — NORETHIN ACE-ETH ESTRAD-FE 1-20 MG-MCG PO TABS: 1.0000 | ORAL_TABLET | Freq: Every day | ORAL | 1 refills | Status: AC

## 2024-06-24 NOTE — Progress Notes (Signed)
   Acute Office Visit  Subjective:    Patient ID: Veronica Mclean, female    DOB: 04/21/1996, 28 y.o.   MRN: [id number]   HPI 28 y.o. presents today as newly established patient. Last seen in our office in March 2022. Complains of menses every 2 weeks in September and October. Normal menses in November but had episode of severe abdominal pain and presented to ER 11/9. Pain began with intercourse and then progressed, had associated vomiting and diarrhea. Pain subsided and then had cramping for a couple days afterwards. Ultrasound unremarkable in ER. Also reports cramping with intercourse and sometimes has spotting. Sexually active with long-term partner. H/O AUB, bicornate uterus, IUFD 2019. Had uncomplicated vaginal delivery in February 2025. Planning to try for pregnancy soon. Not breastfeeding.   Patient's last menstrual period was 06/18/2024 (exact date). Period Duration (Days): 5 Period Pattern: (!) Irregular Menstrual Flow: Heavy Menstrual Control: Other (Comment) (diapers) Dysmenorrhea: (!) Severe Dysmenorrhea Symptoms: Cramping, Nausea  Review of Systems  Constitutional: Negative.   Genitourinary:  Positive for dyspareunia and menstrual problem. Negative for vaginal discharge and vaginal pain.       Objective:    Physical Exam Constitutional:      Appearance: Normal appearance.  Genitourinary:    General: Normal vulva.     Vagina: Normal.     Cervix: Normal.     Uterus: Normal.      Adnexa: Right adnexa normal and left adnexa normal.     BP 114/74   Pulse 74   Wt 162 lb (73.5 kg)   LMP 06/18/2024 (Exact Date)   SpO2 99%   BMI 25.37 kg/m  Wt Readings from Last 3 Encounters:  06/24/24 162 lb (73.5 kg)  05/30/24 150 lb (68 kg)  03/08/24 182 lb 14.4 oz (83 kg)        Veronica Mclean, CMA present as biomedical engineer.   UPT neg  Assessment & Plan:   Problem List Items Addressed This Visit   None Visit Diagnoses       Abnormal uterine bleeding (AUB)    -   Primary   Relevant Medications   norethindrone-ethinyl estradiol-FE (LOESTRIN FE) 1-20 MG-MCG tablet   Other Relevant Orders   Pregnancy, urine     Postcoital bleeding       Relevant Orders   SureSwab Advanced Vaginitis Plus,TMA     Dysmenorrhea       Relevant Medications   norethindrone-ethinyl estradiol-FE (LOESTRIN FE) 1-20 MG-MCG tablet     Screening for STD (sexually transmitted disease)       Relevant Orders   SureSwab Advanced Vaginitis Plus,TMA      Plan: Discussed ER visit and ultrasound findings. Likely ovarian cyst cause for severe pain episode last month. We did again discuss endometriosis, S/S, diagnosis and management. Would like to try 53-month course of OCPs. STD panel pending.    Return if symptoms worsen or fail to improve.    Veronica DELENA Shutter DNP, 11:16 AM 06/24/2024

## 2024-06-25 LAB — SURESWAB® ADVANCED VAGINITIS PLUS,TMA
C. trachomatis RNA, TMA: NOT DETECTED
CANDIDA SPECIES: DETECTED — AB
Candida glabrata: NOT DETECTED
N. gonorrhoeae RNA, TMA: NOT DETECTED
SURESWAB(R) ADV BACTERIAL VAGINOSIS(BV),TMA: POSITIVE — AB
TRICHOMONAS VAGINALIS (TV),TMA: NOT DETECTED

## 2024-06-27 ENCOUNTER — Ambulatory Visit: Payer: Self-pay | Admitting: Nurse Practitioner

## 2024-06-27 ENCOUNTER — Other Ambulatory Visit: Payer: Self-pay | Admitting: Nurse Practitioner

## 2024-06-27 DIAGNOSIS — B9689 Other specified bacterial agents as the cause of diseases classified elsewhere: Secondary | ICD-10-CM

## 2024-06-27 DIAGNOSIS — B3731 Acute candidiasis of vulva and vagina: Secondary | ICD-10-CM

## 2024-06-27 MED ORDER — FLUCONAZOLE 150 MG PO TABS: 150.0000 mg | ORAL_TABLET | ORAL | 0 refills | Status: AC

## 2024-06-27 MED ORDER — METRONIDAZOLE 500 MG PO TABS: 500.0000 mg | ORAL_TABLET | Freq: Two times a day (BID) | ORAL | 0 refills | Status: AC
